# Patient Record
Sex: Male | Born: 1975
Health system: Southern US, Community
[De-identification: ages and names within clinical notes are randomized; demographics above are authoritative.]

## PROBLEM LIST (undated history)

## (undated) DIAGNOSIS — K7689 Other specified diseases of liver: Secondary | ICD-10-CM

## (undated) DIAGNOSIS — I10 Essential (primary) hypertension: Secondary | ICD-10-CM

## (undated) DIAGNOSIS — E785 Hyperlipidemia, unspecified: Secondary | ICD-10-CM

## (undated) HISTORY — DX: Other specified diseases of liver: K76.89

## (undated) HISTORY — DX: Essential (primary) hypertension: I10

## (undated) HISTORY — PX: VASECTOMY: SHX75

## (undated) HISTORY — PX: SPINE SURGERY: SHX786

## (undated) HISTORY — DX: Hyperlipidemia, unspecified: E78.5

---

## 2002-03-25 ENCOUNTER — Ambulatory Visit (HOSPITAL_COMMUNITY): Admission: RE | Admit: 2002-03-25 | Discharge: 2002-03-25 | Payer: Self-pay | Admitting: Neurosurgery

## 2002-03-25 ENCOUNTER — Encounter: Payer: Self-pay | Admitting: Neurosurgery

## 2002-09-15 ENCOUNTER — Encounter: Payer: Self-pay | Admitting: Neurosurgery

## 2002-09-15 ENCOUNTER — Ambulatory Visit (HOSPITAL_COMMUNITY): Admission: RE | Admit: 2002-09-15 | Discharge: 2002-09-15 | Payer: Self-pay | Admitting: Neurosurgery

## 2004-11-29 ENCOUNTER — Ambulatory Visit: Payer: Self-pay | Admitting: Family Medicine

## 2008-02-04 ENCOUNTER — Ambulatory Visit: Payer: Self-pay | Admitting: Internal Medicine

## 2009-11-10 ENCOUNTER — Ambulatory Visit: Payer: Self-pay | Admitting: Family

## 2011-05-30 ENCOUNTER — Other Ambulatory Visit: Payer: Self-pay | Admitting: Internal Medicine

## 2011-06-18 ENCOUNTER — Ambulatory Visit (INDEPENDENT_AMBULATORY_CARE_PROVIDER_SITE_OTHER): Payer: 59 | Admitting: Internal Medicine

## 2011-06-18 ENCOUNTER — Encounter: Payer: Self-pay | Admitting: Internal Medicine

## 2011-06-18 ENCOUNTER — Other Ambulatory Visit: Payer: Self-pay | Admitting: Internal Medicine

## 2011-06-18 ENCOUNTER — Ambulatory Visit (INDEPENDENT_AMBULATORY_CARE_PROVIDER_SITE_OTHER)
Admission: RE | Admit: 2011-06-18 | Discharge: 2011-06-18 | Disposition: A | Discharge status: Home / Self Care | Payer: 59 | Source: Ambulatory Visit | Attending: Internal Medicine | Admitting: Internal Medicine

## 2011-06-18 VITALS — BP 120/70 | HR 71 | Temp 98.1°F | Resp 16 | Ht 70.0 in | Wt 179.5 lb

## 2011-06-18 DIAGNOSIS — R109 Unspecified abdominal pain: Secondary | ICD-10-CM

## 2011-06-18 DIAGNOSIS — E1165 Type 2 diabetes mellitus with hyperglycemia: Secondary | ICD-10-CM | POA: Insufficient documentation

## 2011-06-18 DIAGNOSIS — E785 Hyperlipidemia, unspecified: Secondary | ICD-10-CM | POA: Insufficient documentation

## 2011-06-18 DIAGNOSIS — E1169 Type 2 diabetes mellitus with other specified complication: Secondary | ICD-10-CM | POA: Insufficient documentation

## 2011-06-18 DIAGNOSIS — IMO0002 Reserved for concepts with insufficient information to code with codable children: Secondary | ICD-10-CM | POA: Insufficient documentation

## 2011-06-18 DIAGNOSIS — E119 Type 2 diabetes mellitus without complications: Secondary | ICD-10-CM

## 2011-06-18 DIAGNOSIS — I10 Essential (primary) hypertension: Secondary | ICD-10-CM | POA: Insufficient documentation

## 2011-06-18 LAB — COMPREHENSIVE METABOLIC PANEL
ALT: 52 U/L (ref 0–53)
AST: 29 U/L (ref 0–37)
Albumin: 4.1 g/dL (ref 3.5–5.2)
Alkaline Phosphatase: 55 U/L (ref 39–117)
BUN: 15 mg/dL (ref 6–23)
CO2: 28 mEq/L (ref 19–32)
Calcium: 9.4 mg/dL (ref 8.4–10.5)
Chloride: 102 mEq/L (ref 96–112)
Creatinine, Ser: 0.7 mg/dL (ref 0.4–1.5)
GFR: 135.91 mL/min (ref >60.00)
Glucose, Bld: 162 mg/dL — ABNORMAL HIGH (ref 70–99)
Potassium: 4.3 mEq/L (ref 3.5–5.1)
Sodium: 139 mEq/L (ref 135–145)
Total Bilirubin: 0.6 mg/dL (ref 0.3–1.2)
Total Protein: 7.3 g/dL (ref 6.0–8.3)

## 2011-06-18 LAB — CBC WITH DIFFERENTIAL/PLATELET
Basophils Absolute: 0 10*3/uL (ref 0.0–0.1)
Basophils Relative: 0.7 % (ref 0.0–3.0)
Eosinophils Absolute: 0.3 10*3/uL (ref 0.0–0.7)
HCT: 42.1 % (ref 39.0–52.0)
Hemoglobin: 14.6 g/dL (ref 13.0–17.0)
Lymphocytes Relative: 24.6 % (ref 12.0–46.0)
Lymphs Abs: 1 10*3/uL (ref 0.7–4.0)
MCHC: 34.6 g/dL (ref 30.0–36.0)
MCV: 93.1 fl (ref 78.0–100.0)
Monocytes Absolute: 0.5 10*3/uL (ref 0.1–1.0)
Monocytes Relative: 11.2 % (ref 3.0–12.0)
Neutro Abs: 2.4 10*3/uL (ref 1.4–7.7)
Neutrophils Relative %: 57.1 % (ref 43.0–77.0)
Platelets: 194 10*3/uL (ref 150.0–400.0)
RBC: 4.52 Mil/uL (ref 4.22–5.81)
RDW: 12.4 % (ref 11.5–14.6)
WBC: 4.2 10*3/uL — ABNORMAL LOW (ref 4.5–10.5)

## 2011-06-18 NOTE — Progress Notes (Signed)
  Subjective:    Patient ID: Andrew Molina, male    DOB: [birth year], 35 y.o.   MRN: [id number]  HPI  35 yo white male with history of DM resents with a one week history of post prandial abdominal pain, severe at times, lasting several hours before resolving spontaneously.  The pain has been recurrent, waxing and waning for the last several days .  Sometimes lasting several hours sometimes lasting 8 to 12 hours,  Aggravated by eating.  He denies  nausea,  But had cold chills.[name]  His wife and daughter are both sick with flu like symptoms. Yesterday had a black stool but had taken Pepto bismol on Friday or Saturday. Today stool was brown but loose. He has a hsitroy of NSAID use, takes 2 Alleve daily up until last week but still taking one alleve daily,  No aspirin products.7  Past Medical History  Diagnosis Date  . Other chronic nonalcoholic liver disease   . Hypertension   . Diabetes mellitus 2005  . Hyperlipidemia   . Cataract    . Current Outpatient Prescriptions on File Prior to Visit  Medication Sig Dispense Refill  . lisinopril (PRINIVIL,ZESTRIL) 2.5 MG tablet TAKE ONE TABLET BY MOUTH EVERY DAY  90 tablet  3     Review of Systems     Objective:   Physical Exam  Abdominal: He exhibits no shifting dullness, no pulsatile liver, no fluid wave, no abdominal bruit, no ascites and no pulsatile midline mass. There is no hepatosplenomegaly. There is tenderness in the right lower quadrant and periumbilical area. There is no rebound, no CVA tenderness and negative Murphy's sign.            Assessment & Plan:  Abdominal pain:  Etiology unclear:  gastrtis vs pancreatitis, diverticulitis or appendicitis.  Empiric labs ordered,  Abdominal films to evla bowel gas patten  /Diabetes Mellitusl  He is overdue for his 3 month hgba1c., to be drawn today

## 2011-06-18 NOTE — Patient Instructions (Signed)
We will call you with the results of your x rays and labs.

## 2011-06-19 ENCOUNTER — Telehealth: Payer: Self-pay | Admitting: Internal Medicine

## 2011-06-19 LAB — LIPASE: Lipase: 52 U/L (ref 11.0–59.0)

## 2011-06-19 MED ORDER — OMEPRAZOLE 40 MG PO CPDR: 40.0000 mg | DELAYED_RELEASE_CAPSULE | Freq: Every day | ORAL | Status: DC

## 2011-06-19 NOTE — Progress Notes (Signed)
Addended by: Duncan Dull on: 06/19/2011 05:34 PM   Modules accepted: Orders

## 2011-06-19 NOTE — Telephone Encounter (Signed)
[phone] PATIENT CALL TO GET LAB AND XRAY RESULTS HE HAD DONE YESTERDAY

## 2011-06-19 NOTE — Telephone Encounter (Signed)
Patient notified of x-ray results.

## 2011-06-21 ENCOUNTER — Telehealth: Payer: Self-pay | Admitting: Internal Medicine

## 2011-06-21 NOTE — Telephone Encounter (Signed)
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

## 2011-09-20 ENCOUNTER — Ambulatory Visit (INDEPENDENT_AMBULATORY_CARE_PROVIDER_SITE_OTHER): Payer: 59 | Admitting: Internal Medicine

## 2011-09-20 ENCOUNTER — Encounter: Payer: Self-pay | Admitting: Internal Medicine

## 2011-09-20 ENCOUNTER — Other Ambulatory Visit: Payer: Self-pay | Admitting: *Deleted

## 2011-09-20 VITALS — BP 118/72 | HR 92 | Temp 98.5°F | Wt 181.0 lb

## 2011-09-20 DIAGNOSIS — E118 Type 2 diabetes mellitus with unspecified complications: Secondary | ICD-10-CM

## 2011-09-20 DIAGNOSIS — K760 Fatty (change of) liver, not elsewhere classified: Secondary | ICD-10-CM

## 2011-09-20 DIAGNOSIS — IMO0001 Reserved for inherently not codable concepts without codable children: Secondary | ICD-10-CM

## 2011-09-20 DIAGNOSIS — E1165 Type 2 diabetes mellitus with hyperglycemia: Secondary | ICD-10-CM

## 2011-09-20 DIAGNOSIS — K7689 Other specified diseases of liver: Secondary | ICD-10-CM

## 2011-09-20 DIAGNOSIS — E785 Hyperlipidemia, unspecified: Secondary | ICD-10-CM

## 2011-09-20 DIAGNOSIS — I10 Essential (primary) hypertension: Secondary | ICD-10-CM

## 2011-09-20 DIAGNOSIS — IMO0002 Reserved for concepts with insufficient information to code with codable children: Secondary | ICD-10-CM

## 2011-09-20 LAB — COMPREHENSIVE METABOLIC PANEL
Alkaline Phosphatase: 48 U/L (ref 39–117)
CO2: 25 mEq/L (ref 19–32)
Creatinine, Ser: 0.9 mg/dL (ref 0.4–1.5)
GFR: 96.58 mL/min (ref >60.00)
Glucose, Bld: 234 mg/dL — ABNORMAL HIGH (ref 70–99)
Total Bilirubin: 0.3 mg/dL (ref 0.3–1.2)

## 2011-09-20 MED ORDER — SITAGLIPTIN PHOSPHATE 100 MG PO TABS: 100.0000 mg | ORAL_TABLET | Freq: Every day | ORAL | Status: DC

## 2011-09-20 MED ORDER — GLUCOSE BLOOD VI STRP: ORAL_STRIP | Status: DC

## 2011-09-20 NOTE — Patient Instructions (Signed)
We are adding januvia 100 mg one  tablet dail  to metformin and glipizide.    Check your 2 hr post prandial lunch blood sugar once in a while

## 2011-09-20 NOTE — Progress Notes (Signed)
Subjective:    Patient ID: Andrew Molina, male    DOB: 1976/04/23, 36 y.o.   MRN: [id number]  HPI  36 yr old white male with DM Type 2, on oral medications, hyperlipidemia, presents for 3 month follow up.  Not checking sugars regularly because of cost of test strips.  Has not been exercising regularly or limiting his starches because he eats fast food at lunch daily and several times weekly for dinner.  Tolerating glipizide and metformin with no episodes of hypoglycemia.  I suggested adding insulin if his repeat A1c remains elevated,  Last one was 7.9 in November.  He recalls insulin "not working" for him several years ago with Dr. Elease Hashimoto.    Past Medical History  Diagnosis Date  . Other chronic nonalcoholic liver disease   . Hypertension   . Diabetes mellitus 2005  . Hyperlipidemia   . Cataract    Current Outpatient Prescriptions on File Prior to Visit  Medication Sig Dispense Refill  . glipiZIDE (GLUCOTROL) 10 MG tablet Take 10 mg by mouth 2 (two) times daily before a meal.        . lisinopril (PRINIVIL,ZESTRIL) 2.5 MG tablet TAKE ONE TABLET BY MOUTH EVERY DAY  90 tablet  3  . metFORMIN (GLUCOPHAGE) 1000 MG tablet Take 1,000 mg by mouth 2 (two) times daily with a meal.        . omeprazole (PRILOSEC) 40 MG capsule Take 1 capsule (40 mg total) by mouth daily.  30 capsule  2  . pravastatin (PRAVACHOL) 20 MG tablet Take 20 mg by mouth daily.          Review of Systems  Constitutional: Negative for fever, chills, diaphoresis, activity change, appetite change, fatigue and unexpected weight change.  HENT: Negative for hearing loss, ear pain, nosebleeds, congestion, sore throat, facial swelling, rhinorrhea, sneezing, drooling, mouth sores, trouble swallowing, neck pain, neck stiffness, dental problem, voice change, postnasal drip, sinus pressure, tinnitus and ear discharge.   Eyes: Negative for photophobia, pain, discharge, redness, itching and visual disturbance.  Respiratory: Negative for apnea,  cough, choking, chest tightness, shortness of breath, wheezing and stridor.   Cardiovascular: Negative for chest pain, palpitations and leg swelling.  Gastrointestinal: Negative for nausea, vomiting, abdominal pain, diarrhea, constipation, blood in stool, abdominal distention, anal bleeding and rectal pain.  Genitourinary: Negative for dysuria, urgency, frequency, hematuria, flank pain, decreased urine volume, scrotal swelling, difficulty urinating and testicular pain.  Musculoskeletal: Negative for myalgias, back pain, joint swelling, arthralgias and gait problem.  Skin: Negative for color change, rash and wound.  Neurological: Negative for dizziness, tremors, seizures, syncope, speech difficulty, weakness, light-headedness, numbness and headaches.  Psychiatric/Behavioral: Negative for suicidal ideas, hallucinations, behavioral problems, confusion, sleep disturbance, dysphoric mood, decreased concentration and agitation. The patient is not nervous/anxious.        Objective:   Physical Exam  Constitutional: He is oriented to person, place, and time.  HENT:  Head: Normocephalic and atraumatic.  Mouth/Throat: Oropharynx is clear and moist.  Eyes: Conjunctivae and EOM are normal.  Neck: Normal range of motion. Neck supple. No JVD present. No thyromegaly present.  Cardiovascular: Normal rate, regular rhythm and normal heart sounds.   Pulmonary/Chest: Effort normal and breath sounds normal. He has no wheezes. He has no rales.  Abdominal: Soft. Bowel sounds are normal. He exhibits no mass. There is no tenderness. There is no rebound.  Musculoskeletal: Normal range of motion. He exhibits no edema.  Neurological: He is alert and oriented to person, place, and  time.  Skin: Skin is warm and dry.  Psychiatric: He has a normal mood and affect.       Assessment & Plan:   Diabetes mellitus type 2, uncontrolled A1c has risen to 8.3 from 7.9 at last check. He is not following a low glycemic index  diet as much as he could and is eating fast food daily for lunch and 2-3 times per week on at dinner.  He is reached maximal benefit of glipizide and metformin and will need to start a third agent. He is reluctant to start insulin but will do so necessary we will try Januvia first at 100 mg daily. Samples for 2 months were given to patient today. If this medication is affordable on his current drug plan we will continue this as adjunct therapy to glipizide and metformin. If not we will start using NPH bedtime insulin portable fasting glucoses of 80-120. Foot exam today was normal. He was reminded to continue annual eye exams with his ophthalmologist. He is on an ACE inhibitor and a statin for goal LDL of 70.  Hyperlipidemia Managed with pravastatin without side effects. He was not fasting today so a fasting panel was not drawn. He has mild ALT elevation today compared to last time. We will follow this again with a repeat hepatic panel in 3 months.  Hypertension Controlled on current regimen. No changes today.  NAFLD (nonalcoholic fatty liver disease) By prior evaluation by GI. No history of liver biopsy. He has mild enzyme elevation today likely due to uncontrolled diabetes and hyperlipidemia. Will follow. Low glycemic index diet again recommended.    Updated Medication List Outpatient Encounter Prescriptions as of 09/20/2011  Medication Sig Dispense Refill  . DISCONTD: glucose blood test strip 1 each by Other route as needed. Use as instructed      . glipiZIDE (GLUCOTROL) 10 MG tablet Take 10 mg by mouth 2 (two) times daily before a meal.        . lisinopril (PRINIVIL,ZESTRIL) 2.5 MG tablet TAKE ONE TABLET BY MOUTH EVERY DAY  90 tablet  3  . metFORMIN (GLUCOPHAGE) 1000 MG tablet Take 1,000 mg by mouth 2 (two) times daily with a meal.        . omeprazole (PRILOSEC) 40 MG capsule Take 1 capsule (40 mg total) by mouth daily.  30 capsule  2  . pravastatin (PRAVACHOL) 20 MG tablet Take 20 mg by mouth  daily.        . sitaGLIPtin (JANUVIA) 100 MG tablet Take 1 tablet (100 mg total) by mouth daily.  30 tablet  1

## 2011-09-22 ENCOUNTER — Encounter: Payer: Self-pay | Admitting: Internal Medicine

## 2011-09-22 DIAGNOSIS — K760 Fatty (change of) liver, not elsewhere classified: Secondary | ICD-10-CM | POA: Insufficient documentation

## 2011-09-22 NOTE — Assessment & Plan Note (Addendum)
A1c has risen to 8.3 from 7.9 at last check. He is not following a low glycemic index diet as much as he could and is eating fast food daily for lunch and 2-3 times per week on at dinner.  He is reached maximal benefit of glipizide and metformin and will need to start a third agent. He is reluctant to start insulin but will do so necessary we will try Januvia first at 100 mg daily. Samples for 2 months were given to patient today. If this medication is affordable on his current drug plan we will continue this as adjunct therapy to glipizide and metformin. If not we will start using NPH bedtime insulin portable fasting glucoses of 80-120. Foot exam today was normal. He was reminded to continue annual eye exams with his ophthalmologist. He is on an ACE inhibitor and a statin for goal LDL of 70.

## 2011-09-22 NOTE — Assessment & Plan Note (Signed)
By prior evaluation by GI. No history of liver biopsy. He has mild enzyme elevation today likely due to uncontrolled diabetes and hyperlipidemia. Will follow. Low glycemic index diet again recommended.

## 2011-09-22 NOTE — Assessment & Plan Note (Signed)
Managed with pravastatin without side effects. He was not fasting today so a fasting panel was not drawn. He has mild ALT elevation today compared to last time. We will follow this again with a repeat hepatic panel in 3 months.

## 2011-09-22 NOTE — Assessment & Plan Note (Signed)
Controlled on current regimen. No changes today 

## 2011-11-11 ENCOUNTER — Encounter: Payer: Self-pay | Admitting: Internal Medicine

## 2011-11-19 ENCOUNTER — Telehealth: Payer: Self-pay | Admitting: Internal Medicine

## 2011-11-19 MED ORDER — PRAVASTATIN SODIUM 20 MG PO TABS: 20.0000 mg | ORAL_TABLET | Freq: Every day | ORAL | Status: DC

## 2011-11-19 NOTE — Telephone Encounter (Signed)
[phone] Pt wife came in he needs refill on pravastatin sodium tablets usp 20 mg [facility] [address]

## 2011-11-19 NOTE — Telephone Encounter (Signed)
Rx called in, patient notified.

## 2011-11-21 ENCOUNTER — Telehealth: Payer: Self-pay | Admitting: Internal Medicine

## 2011-11-21 NOTE — Telephone Encounter (Signed)
Blood sugars are improving with addition of januvia. Ask him to check a few more evening sugars.

## 2011-11-21 NOTE — Telephone Encounter (Signed)
Patient notified

## 2011-11-27 ENCOUNTER — Telehealth: Payer: Self-pay | Admitting: Internal Medicine

## 2011-11-27 DIAGNOSIS — E1165 Type 2 diabetes mellitus with hyperglycemia: Secondary | ICD-10-CM

## 2011-11-27 DIAGNOSIS — IMO0002 Reserved for concepts with insufficient information to code with codable children: Secondary | ICD-10-CM

## 2011-11-27 MED ORDER — SITAGLIPTIN PHOSPHATE 100 MG PO TABS: 100.0000 mg | ORAL_TABLET | Freq: Every day | ORAL | Status: DC

## 2011-11-27 NOTE — Telephone Encounter (Signed)
Spoke with patient, we do not have samples at this time. New rx sent to pharmacy.

## 2011-11-27 NOTE — Telephone Encounter (Signed)
[phone] Pt needs to get samples or rx  For  jueniva Please advise pt [facility] You can leave message on the above phone number

## 2011-11-28 ENCOUNTER — Ambulatory Visit: Payer: 59 | Admitting: Internal Medicine

## 2011-11-28 ENCOUNTER — Other Ambulatory Visit: Payer: Self-pay | Admitting: Internal Medicine

## 2011-12-05 ENCOUNTER — Ambulatory Visit: Payer: 59 | Admitting: Internal Medicine

## 2011-12-11 ENCOUNTER — Telehealth: Payer: Self-pay | Admitting: Internal Medicine

## 2011-12-11 DIAGNOSIS — E118 Type 2 diabetes mellitus with unspecified complications: Secondary | ICD-10-CM

## 2011-12-11 DIAGNOSIS — IMO0002 Reserved for concepts with insufficient information to code with codable children: Secondary | ICD-10-CM

## 2011-12-11 MED ORDER — SITAGLIPTIN PHOSPHATE 100 MG PO TABS: 100.0000 mg | ORAL_TABLET | Freq: Every day | ORAL | Status: DC

## 2011-12-11 NOTE — Telephone Encounter (Signed)
[phone] Pt called checking on his rx for juniva  [facility] [address] Pt was told this was called in he went to pick this up and it is not there Please advise pt

## 2011-12-11 NOTE — Telephone Encounter (Signed)
Rx called in, patient notified.

## 2011-12-17 ENCOUNTER — Encounter: Payer: Self-pay | Admitting: Internal Medicine

## 2011-12-17 ENCOUNTER — Ambulatory Visit (INDEPENDENT_AMBULATORY_CARE_PROVIDER_SITE_OTHER): Payer: 59 | Admitting: Internal Medicine

## 2011-12-17 DIAGNOSIS — E1165 Type 2 diabetes mellitus with hyperglycemia: Secondary | ICD-10-CM

## 2011-12-17 DIAGNOSIS — I10 Essential (primary) hypertension: Secondary | ICD-10-CM

## 2011-12-17 DIAGNOSIS — E785 Hyperlipidemia, unspecified: Secondary | ICD-10-CM

## 2011-12-17 DIAGNOSIS — IMO0001 Reserved for inherently not codable concepts without codable children: Secondary | ICD-10-CM

## 2011-12-17 DIAGNOSIS — IMO0002 Reserved for concepts with insufficient information to code with codable children: Secondary | ICD-10-CM

## 2011-12-17 DIAGNOSIS — E119 Type 2 diabetes mellitus without complications: Secondary | ICD-10-CM

## 2011-12-17 LAB — HEMOGLOBIN A1C: Mean Plasma Glucose: 128 mg/dL — ABNORMAL HIGH (ref <117)

## 2011-12-17 MED ORDER — SAXAGLIPTIN HCL 5 MG PO TABS: 5.0000 mg | ORAL_TABLET | Freq: Every day | ORAL | Status: DC

## 2011-12-17 NOTE — Progress Notes (Signed)
Patient ID: Andrew Molina, male   DOB: Aug 26, 1975, 36 y.o.   MRN: [id number]  Patient Active Problem List  Diagnoses  . Hypertension  . Diabetes mellitus type 2, uncontrolled  . Hyperlipidemia  . NAFLD (nonalcoholic fatty liver disease)    Subjective:  CC:   Chief Complaint  Patient presents with  . Follow-up    HPI:   Andrew Molina a 36 y.o. male who presents Follow up on diabetes.  At last visit he was given rx and samples of Januvia to add to metformin/glipizide for hgba1c of 8.3 on maximal doses of glipizide and metformin.  Since last visit he has been adhering to a low glyceminc index diet more frequently and takign the [place] up until about 10 days ago when he ran out of samples.  The copay is $60/month, which is not affordable.  Has not had any lows except when he skips a meal. Denies nausea, vomiting and diarrhea.    Past Medical History  Diagnosis Date  . Other chronic nonalcoholic liver disease   . Hypertension   . Diabetes mellitus 2005  . Hyperlipidemia   . Cataract     Past Surgical History  Procedure Date  . Spine surgery     microdiskectomy         The following portions of the patient's history were reviewed and updated as appropriate: Allergies, current medications, and problem list.    Review of Systems:   12 Pt  review of systems was negative except those addressed in the HPI,     History   Social History  . Marital Status: Married    Spouse Name: N/A    Number of Children: N/A  . Years of Education: N/A   Occupational History  . Not on file.   Social History Main Topics  . Smoking status: Former Smoker    Quit date: 06/17/2000  . Smokeless tobacco: Current User    Types: Snuff  . Alcohol Use: 3.0 oz/week    5 Cans of beer per week  . Drug Use: No  . Sexually Active: Yes   Other Topics Concern  . Not on file   Social History Narrative  . No narrative on file    Objective:  BP 114/62  Pulse 80  Temp(Src) 98.4 F (36.9  C) (Oral)  Resp 16  Ht 5\' 10"  (1.778 m)  Wt 175 lb 8 oz (79.606 kg)  BMI 25.18 kg/m2  SpO2 97%  General appearance: alert, cooperative and appears stated age Ears: normal TM's and external ear canals both ears Throat: lips, mucosa, and tongue normal; teeth and gums normal Neck: no adenopathy, no carotid bruit, supple, symmetrical, trachea midline and thyroid not enlarged, symmetric, no tenderness/mass/nodules Back: symmetric, no curvature. ROM normal. No CVA tenderness. Lungs: clear to auscultation bilaterally Heart: regular rate and rhythm, S1, S2 normal, no murmur, click, rub or gallop Abdomen: soft, non-tender; bowel sounds normal; no masses,  no organomegaly Pulses: 2+ and symmetric Skin: Skin color, texture, turgor normal. No rashes or lesions Lymph nodes: Cervical, supraclavicular, and axillary nodes normal.  Assessment and Plan:  Diabetes mellitus type 2, uncontrolled Improved with addition of Januvia and better adherence to low glycemic index diet.  Since he is reliant on samples and we are out of [place],  I have given him 3 months on onglyza  (saxagliptan) to use instead, in single form and in combination with metformin.   Hypertension Well controlled on current medications.  No changes today.  Hyperlipidemia  Managed with pravastatin without side effects. He was not fasting today so a fasting panel was not drawn. He has mild ALT elevation today compared to last time. We will follow this again with a repeat hepatic panel in 3 months.       Updated Medication List Outpatient Encounter Prescriptions as of 12/17/2011  Medication Sig Dispense Refill  . glipiZIDE (GLUCOTROL) 10 MG tablet TAKE ONE TABLET BY MOUTH TWICE DAILY  180 tablet  3  . glucose blood test strip Test blood sugar twice a day  100 each  6  . lisinopril (PRINIVIL,ZESTRIL) 2.5 MG tablet TAKE ONE TABLET BY MOUTH EVERY DAY  90 tablet  3  . metFORMIN (GLUCOPHAGE) 1000 MG tablet Take 1,000 mg by mouth 2 (two)  times daily with a meal.        . pravastatin (PRAVACHOL) 20 MG tablet Take 1 tablet (20 mg total) by mouth daily.  30 tablet  3  . DISCONTD: sitaGLIPtin (JANUVIA) 100 MG tablet Take 1 tablet (100 mg total) by mouth daily.  30 tablet  2  . saxagliptin HCl (ONGLYZA) 5 MG TABS tablet Take 1 tablet (5 mg total) by mouth daily.  30 tablet  6  . DISCONTD: omeprazole (PRILOSEC) 40 MG capsule Take 1 capsule (40 mg total) by mouth daily.  30 capsule  2     Orders Placed This Encounter  Procedures  . Hemoglobin A1c  . COMPLETE METABOLIC PANEL WITH GFR  . Microalbumin / creatinine urine ratio  . Hemoglobin A1c  . Comp Met (CMET)  . Microalbumin / creatinine urine ratio    Return in about 3 months (around 03/18/2012).

## 2011-12-17 NOTE — Patient Instructions (Signed)
We are changing from Januvia to onglyza (similar to Januvia, once daily)  either alone or in the combination with controlled release metformin  if you take komblgyze XRE,  Do no take additional metformin with it  If you are taking the onglyza,  Continue your metformin twice daily.

## 2011-12-18 ENCOUNTER — Encounter: Payer: Self-pay | Admitting: *Deleted

## 2011-12-18 ENCOUNTER — Encounter: Payer: Self-pay | Admitting: Internal Medicine

## 2011-12-18 LAB — COMPREHENSIVE METABOLIC PANEL
Albumin: 4.8 g/dL (ref 3.5–5.2)
BUN: 22 mg/dL (ref 6–23)
Calcium: 9.9 mg/dL (ref 8.4–10.5)
Chloride: 103 mEq/L (ref 96–112)
Glucose, Bld: 67 mg/dL — ABNORMAL LOW (ref 70–99)
Potassium: 5 mEq/L (ref 3.5–5.3)

## 2011-12-18 LAB — MICROALBUMIN / CREATININE URINE RATIO
Creatinine, Urine: 111.8 mg/dL
Microalb Creat Ratio: 7.5 mg/g (ref 0.0–30.0)
Microalb, Ur: 0.84 mg/dL (ref 0.00–1.89)

## 2011-12-18 NOTE — Assessment & Plan Note (Signed)
Improved with addition of Januvia and better adherence to low glycemic index diet.  Since he is reliant on samples and we are out of [place],  I have given him 3 months on onglyza  (saxagliptan) to use instead, in single form and in combination with metformin.

## 2011-12-18 NOTE — Assessment & Plan Note (Signed)
Managed with pravastatin without side effects. He was not fasting today so a fasting panel was not drawn. He has mild ALT elevation today compared to last time. We will follow this again with a repeat hepatic panel in 3 months.

## 2011-12-18 NOTE — Assessment & Plan Note (Signed)
Well controlled on current medications.  No changes today. 

## 2012-01-16 ENCOUNTER — Other Ambulatory Visit: Payer: Self-pay | Admitting: Internal Medicine

## 2012-03-18 ENCOUNTER — Ambulatory Visit: Payer: 59 | Admitting: Internal Medicine

## 2012-04-02 ENCOUNTER — Other Ambulatory Visit: Payer: Self-pay | Admitting: Internal Medicine

## 2012-04-24 ENCOUNTER — Other Ambulatory Visit: Payer: 59

## 2012-04-27 ENCOUNTER — Ambulatory Visit: Payer: 59 | Admitting: Internal Medicine

## 2012-04-28 ENCOUNTER — Other Ambulatory Visit (INDEPENDENT_AMBULATORY_CARE_PROVIDER_SITE_OTHER): Payer: 59

## 2012-04-28 DIAGNOSIS — IMO0001 Reserved for inherently not codable concepts without codable children: Secondary | ICD-10-CM

## 2012-04-28 DIAGNOSIS — E785 Hyperlipidemia, unspecified: Secondary | ICD-10-CM

## 2012-04-28 DIAGNOSIS — E119 Type 2 diabetes mellitus without complications: Secondary | ICD-10-CM

## 2012-04-28 DIAGNOSIS — IMO0002 Reserved for concepts with insufficient information to code with codable children: Secondary | ICD-10-CM

## 2012-04-28 DIAGNOSIS — I10 Essential (primary) hypertension: Secondary | ICD-10-CM

## 2012-04-28 LAB — LIPID PANEL
HDL: 37.4 mg/dL — ABNORMAL LOW (ref >39.00)
Total CHOL/HDL Ratio: 5
VLDL: 30.2 mg/dL (ref 0.0–40.0)

## 2012-04-28 LAB — COMPREHENSIVE METABOLIC PANEL
ALT: 57 U/L — ABNORMAL HIGH (ref 0–53)
AST: 29 U/L (ref 0–37)
Creatinine, Ser: 0.7 mg/dL (ref 0.4–1.5)
GFR: 147.33 mL/min (ref >60.00)
Total Bilirubin: 0.5 mg/dL (ref 0.3–1.2)

## 2012-04-28 LAB — HEMOGLOBIN A1C: Hgb A1c MFr Bld: 6.2 % (ref 4.6–6.5)

## 2012-04-28 MED ORDER — PRAVASTATIN SODIUM 20 MG PO TABS: 40.0000 mg | ORAL_TABLET | Freq: Every day | ORAL | Status: DC

## 2012-04-28 NOTE — Addendum Note (Signed)
Addended by: Duncan Dull on: 04/28/2012 05:42 PM   Modules accepted: Orders

## 2012-04-29 ENCOUNTER — Other Ambulatory Visit: Payer: Self-pay | Admitting: Internal Medicine

## 2012-04-29 MED ORDER — PRAVASTATIN SODIUM 40 MG PO TABS: 40.0000 mg | ORAL_TABLET | Freq: Every day | ORAL | Status: DC

## 2012-05-05 ENCOUNTER — Encounter: Payer: Self-pay | Admitting: Internal Medicine

## 2012-05-05 ENCOUNTER — Ambulatory Visit (INDEPENDENT_AMBULATORY_CARE_PROVIDER_SITE_OTHER): Payer: 59 | Admitting: Internal Medicine

## 2012-05-05 VITALS — BP 116/80 | HR 88 | Temp 97.9°F | Resp 16 | Ht 70.0 in | Wt 177.0 lb

## 2012-05-05 DIAGNOSIS — E119 Type 2 diabetes mellitus without complications: Secondary | ICD-10-CM

## 2012-05-05 DIAGNOSIS — K7689 Other specified diseases of liver: Secondary | ICD-10-CM

## 2012-05-05 DIAGNOSIS — K2960 Other gastritis without bleeding: Secondary | ICD-10-CM

## 2012-05-05 DIAGNOSIS — K760 Fatty (change of) liver, not elsewhere classified: Secondary | ICD-10-CM

## 2012-05-05 DIAGNOSIS — E785 Hyperlipidemia, unspecified: Secondary | ICD-10-CM

## 2012-05-05 NOTE — Patient Instructions (Addendum)
decrease your glipizide to 5 mg twice daily to see if the hypoglycemic events stop happening .

## 2012-05-05 NOTE — Progress Notes (Signed)
Patient ID: Andrew Molina, male   DOB: 1976-04-23, 36 y.o.   MRN: [id number]  Patient Active Problem List  Diagnosis  . Hypertension  . Hyperlipidemia LDL goal < 70  . NAFLD (nonalcoholic fatty liver disease)  . Diabetes mellitus type 2, uncomplicated  . Gastritis due to nonsteroidal anti-inflammatory drug    Subjective:  CC:   Chief Complaint  Patient presents with  . Follow-up    DM, ran out of Onglyza samples    HPI:   Andrew Molina a 36 y.o. male who presents 3 month followup on diabetes and hyperlipidemia. At last visit a third agent was added for control of diabetes. However he has been out of onglyza for  the past 3 weeks,  and has beenTaking just glipizde and metformin.  He reports symptoms of hypoglecemia occurring about  3 hours post breakfast  that are occurring in twice weekly. He has modified his diet somewhat and is restricting his carbohydrates more than in the past. He does not check his sugars on a regular basis.  2) he has been having mid to low thoracic back pain since Saturday  when he went bow hunting.  He has been Using alleve once daily (2 tablets) .   The pain does not radiate into either buttock or leg. It is improving. He has had no stomach upset with use of Alleve. Although in the past he has had gastritis from use of NSAIDs   Past Medical History  Diagnosis Date  . Other chronic nonalcoholic liver disease   . Hypertension   . Diabetes mellitus 2005  . Hyperlipidemia   . Cataract     Past Surgical History  Procedure Date  . Spine surgery     microdiskectomy         The following portions of the patient's history were reviewed and updated as appropriate: Allergies, current medications, and problem list.    Review of Systems:   12 Pt  review of systems was negative except those addressed in the HPI,     History   Social History  . Marital Status: Married    Spouse Name: N/A    Number of Children: N/A  . Years of Education: N/A    Occupational History  . Not on file.   Social History Main Topics  . Smoking status: Former Smoker    Quit date: 06/17/2000  . Smokeless tobacco: Current User    Types: Snuff  . Alcohol Use: 3.0 oz/week    5 Cans of beer per week  . Drug Use: No  . Sexually Active: Yes   Other Topics Concern  . Not on file   Social History Narrative  . No narrative on file    Objective:  BP 116/80  Pulse 88  Temp 97.9 F (36.6 C) (Oral)  Resp 16  Ht 5\' 10"  (1.778 m)  Wt 177 lb (80.287 kg)  BMI 25.40 kg/m2  SpO2 98%  General appearance: alert, cooperative and appears stated age Ears: normal TM's and external ear canals both ears Throat: lips, mucosa, and tongue normal; teeth and gums normal Neck: no adenopathy, no carotid bruit, supple, symmetrical, trachea midline and thyroid not enlarged, symmetric, no tenderness/mass/nodules Back: symmetric, no curvature. ROM normal. No CVA tenderness. Lungs: clear to auscultation bilaterally Heart: regular rate and rhythm, S1, S2 normal, no murmur, click, rub or gallop Abdomen: soft, non-tender; bowel sounds normal; no masses,  no organomegaly Pulses: 2+ and symmetric Skin: Skin color, texture, turgor normal. No  rashes or lesions Lymph nodes: Cervical, supraclavicular, and axillary nodes normal.  Assessment and Plan:  Diabetes mellitus type 2, uncomplicated His diabetes is now well controlled since adding a third agent and modifying his diet. He will A1c is now 6.1. Since he has been Ativan positive for 3 weeks we have opted to continue suspension of the third agent. Since he is continuing to hypoglycemic events without it I have changed his glipizide dose to 5 mg twice daily. He can increase this if he notices his blood sugars start to rise. We will repeat his hemoglobin A1c in 3 months. Reminder for annual eye exam given. Flu exam offered but also rejected by the patient.  Hyperlipidemia LDL goal < 70 I have increased his pravastatin dose to  40 mg daily for goal LDL less than 70, currently 124. Repeat in 3 months.  NAFLD (nonalcoholic fatty liver disease) Liver enzymes have been slightly elevated but stable. No changes to regimen today. Low glycemic index diet and daily exercise again reinforced.  Gastritis due to nonsteroidal anti-inflammatory drug He has a history of mild gastritis without ulcer during prior use of NSAIDs. He is currently using Aleve for management of back strain. Recommended use of a daily PPI during use of NSAIDs.   Updated Medication List Outpatient Encounter Prescriptions as of 05/05/2012  Medication Sig Dispense Refill  . glipiZIDE (GLUCOTROL) 10 MG tablet TAKE ONE TABLET BY MOUTH TWICE DAILY  180 tablet  3  . glucose blood test strip Test blood sugar twice a day  100 each  6  . lisinopril (PRINIVIL,ZESTRIL) 2.5 MG tablet TAKE ONE TABLET BY MOUTH EVERY DAY  90 tablet  3  . metFORMIN (GLUCOPHAGE) 1000 MG tablet TAKE ONE TABLET BY MOUTH TWICE DAILY  180 tablet  3  . pravastatin (PRAVACHOL) 40 MG tablet Take 1 tablet (40 mg total) by mouth daily.  90 tablet  3  . saxagliptin HCl (ONGLYZA) 5 MG TABS tablet Take 1 tablet (5 mg total) by mouth daily.  30 tablet  6     No orders of the defined types were placed in this encounter.    No Follow-up on file.

## 2012-05-06 DIAGNOSIS — E119 Type 2 diabetes mellitus without complications: Secondary | ICD-10-CM | POA: Insufficient documentation

## 2012-05-06 DIAGNOSIS — K2960 Other gastritis without bleeding: Secondary | ICD-10-CM | POA: Insufficient documentation

## 2012-05-06 NOTE — Assessment & Plan Note (Signed)
I have increased his pravastatin dose to 40 mg daily for goal LDL less than 70, currently 124. Repeat in 3 months.

## 2012-05-06 NOTE — Assessment & Plan Note (Signed)
His diabetes is now well controlled since adding a third agent and modifying his diet. He will A1c is now 6.1. Since he has been Ativan positive for 3 weeks we have opted to continue suspension of the third agent. Since he is continuing to hypoglycemic events without it I have changed his glipizide dose to 5 mg twice daily. He can increase this if he notices his blood sugars start to rise. We will repeat his hemoglobin A1c in 3 months.

## 2012-05-06 NOTE — Assessment & Plan Note (Signed)
He has a history of mild gastritis without ulcer during prior use of NSAIDs. He is currently using Aleve for management of back strain. Recommended use of a daily PPI during use of NSAIDs.

## 2012-05-06 NOTE — Assessment & Plan Note (Signed)
Liver enzymes have been slightly elevated but stable. No changes to regimen today. Low glycemic index diet and daily exercise again reinforced.

## 2012-06-25 ENCOUNTER — Other Ambulatory Visit: Payer: Self-pay | Admitting: Internal Medicine

## 2012-08-06 ENCOUNTER — Telehealth: Payer: Self-pay | Admitting: *Deleted

## 2012-08-06 NOTE — Telephone Encounter (Signed)
A1c, cmet,  And fasting lipids.  Diabetes mellitus, hyperlipidemia thanks

## 2012-08-06 NOTE — Telephone Encounter (Signed)
Pt is coming in for labs tomorrow 08-06-2012, what labs and dx would you like for this pt? Thank you

## 2012-08-07 ENCOUNTER — Other Ambulatory Visit (INDEPENDENT_AMBULATORY_CARE_PROVIDER_SITE_OTHER): Payer: 59

## 2012-08-07 ENCOUNTER — Other Ambulatory Visit: Payer: Self-pay | Admitting: *Deleted

## 2012-08-07 DIAGNOSIS — E119 Type 2 diabetes mellitus without complications: Secondary | ICD-10-CM

## 2012-08-07 DIAGNOSIS — E785 Hyperlipidemia, unspecified: Secondary | ICD-10-CM

## 2012-08-07 LAB — COMPREHENSIVE METABOLIC PANEL
Alkaline Phosphatase: 52 U/L (ref 39–117)
BUN: 15 mg/dL (ref 6–23)
CO2: 26 mEq/L (ref 19–32)
Creatinine, Ser: 0.6 mg/dL (ref 0.4–1.5)
GFR: 149.76 mL/min (ref >60.00)
Glucose, Bld: 315 mg/dL — ABNORMAL HIGH (ref 70–99)
Total Bilirubin: 0.8 mg/dL (ref 0.3–1.2)
Total Protein: 6.9 g/dL (ref 6.0–8.3)

## 2012-08-07 LAB — LIPID PANEL
Cholesterol: 171 mg/dL (ref 0–200)
HDL: 37.6 mg/dL — ABNORMAL LOW (ref >39.00)
Total CHOL/HDL Ratio: 5
Triglycerides: 227 mg/dL — ABNORMAL HIGH (ref 0.0–149.0)
VLDL: 45.4 mg/dL — ABNORMAL HIGH (ref 0.0–40.0)

## 2012-08-12 ENCOUNTER — Ambulatory Visit (INDEPENDENT_AMBULATORY_CARE_PROVIDER_SITE_OTHER): Payer: 59 | Admitting: Internal Medicine

## 2012-08-12 ENCOUNTER — Encounter: Payer: Self-pay | Admitting: Internal Medicine

## 2012-08-12 VITALS — BP 116/78 | HR 77 | Temp 97.9°F | Resp 16 | Wt 180.5 lb

## 2012-08-12 DIAGNOSIS — IMO0001 Reserved for inherently not codable concepts without codable children: Secondary | ICD-10-CM

## 2012-08-12 DIAGNOSIS — I10 Essential (primary) hypertension: Secondary | ICD-10-CM

## 2012-08-12 DIAGNOSIS — E119 Type 2 diabetes mellitus without complications: Secondary | ICD-10-CM

## 2012-08-12 DIAGNOSIS — E785 Hyperlipidemia, unspecified: Secondary | ICD-10-CM

## 2012-08-12 NOTE — Patient Instructions (Addendum)
  We need to start either onglyza, tradjenta or januvia to get your diabetes under control.  I have given you samples of each,  Please check the [employer] formularl to see which one they cover the best  And let me know.  [employer] makes a low carb pasta 5 /serving available at all groceries .

## 2012-08-13 ENCOUNTER — Encounter: Payer: Self-pay | Admitting: Internal Medicine

## 2012-08-13 ENCOUNTER — Telehealth: Payer: Self-pay | Admitting: Internal Medicine

## 2012-08-13 MED ORDER — ATORVASTATIN CALCIUM 20 MG PO TABS: 20.0000 mg | ORAL_TABLET | Freq: Every day | ORAL | Status: DC

## 2012-08-13 NOTE — Assessment & Plan Note (Addendum)
Without the third agent his hemoglobin achas  risen to above 8. We will resume onglyza.  He is also been given samples of tradjenta N. Januvia since it is unclear from E. prescribed which one of these medications will be covered by his insurance. He has been instructed to use them one at a time and to let me know which one his insurance will cover. He is overdue for diabetic eye exam. He is no evidence of proteinuria but is on lisinopril for hypertension. He is also taking a statin for control of Crestor and baby aspirin daily. Foot exam was normal.

## 2012-08-13 NOTE — Telephone Encounter (Signed)
Please let cough that after reviewing his cholesterol levels for the last several months think he needs a stronger cholesterol medication. Therefore when he finishes his current pravastatin supply I would like him to start generic Lipitor or atorvastatin,   20 mg daily. I've called this into his pharmacy.

## 2012-08-13 NOTE — Assessment & Plan Note (Signed)
well controlled on current medications. Renal function is normal and stable. No changes today.

## 2012-08-13 NOTE — Progress Notes (Signed)
Patient ID: Andrew Molina, male   DOB: [birth year], 37 y.o.   MRN: [id number]   Patient Active Problem List  Diagnosis  . Hypertension  . Hyperlipidemia LDL goal < 70  . NAFLD (nonalcoholic fatty liver disease)  . Diabetes mellitus type 2, uncontrolled, without complications  . Gastritis due to nonsteroidal anti-inflammatory drug    Subjective:  CC:   Chief Complaint  Patient presents with  . Follow-up    HPI:   Andrew Molina a 37 y.o. male who presents 3 month followup on diabetes mellitus. His recent lab work indicates loss of control. Hemoglobin A1c is now over 8. He has not been following a low glycemic index diet. He does not have any numbness or tingling. He is not symptomatic. He has not had any low blood sugars. He is taking his glipizide and his metformin. He wasusing samples of oOnglyza but ran out and has not had that filled.    Past Medical History  Diagnosis Date  . Other chronic nonalcoholic liver disease   . Hypertension   . Diabetes mellitus 2005  . Hyperlipidemia   . Cataract     Past Surgical History  Procedure Date  . Spine surgery     microdiskectomy         The following portions of the patient's history were reviewed and updated as appropriate: Allergies, current medications, and problem list.    Review of Systems:  Patient denies headache, fevers, malaise, unintentional weight loss, skin rash, eye pain, sinus congestion and sinus pain, sore throat, dysphagia,  hemoptysis , cough, dyspnea, wheezing, chest pain, palpitations, orthopnea, edema, abdominal pain, nausea, melena, diarrhea, constipation, flank pain, dysuria, hematuria, urinary  Frequency, nocturia, numbness, tingling, seizures,  Focal weakness, Loss of consciousness,  Tremor, insomnia, depression, anxiety, and suicidal ideation.     History   Social History  . Marital Status: Married    Spouse Name: N/A    Number of Children: N/A  . Years of Education: N/A   Occupational History  .  Not on file.   Social History Main Topics  . Smoking status: Former Smoker    Quit date: 06/17/2000  . Smokeless tobacco: Current User    Types: Snuff  . Alcohol Use: 3.0 oz/week    5 Cans of beer per week  . Drug Use: No  . Sexually Active: Yes   Other Topics Concern  . Not on file   Social History Narrative  . No narrative on file    Objective:  BP 116/78  Pulse 77  Temp 97.9 F (36.6 C) (Oral)  Resp 16  Wt 180 lb 8 oz (81.874 kg)  SpO2 97%  General appearance: alert, cooperative and appears stated age Ears: normal TM's and external ear canals both ears Throat: lips, mucosa, and tongue normal; teeth and gums normal Neck: no adenopathy, no carotid bruit, supple, symmetrical, trachea midline and thyroid not enlarged, symmetric, no tenderness/mass/nodules Back: symmetric, no curvature. ROM normal. No CVA tenderness. Lungs: clear to auscultation bilaterally Heart: regular rate and rhythm, S1, S2 normal, no murmur, click, rub or gallop Abdomen: soft, non-tender; bowel sounds normal; no masses,  no organomegaly Pulses: 2+ and symmetric Skin: Skin color, texture, turgor normal. No rashes or lesions Lymph nodes: Cervical, supraclavicular, and axillary nodes normal.  Assessment and Plan:  Diabetes mellitus type 2, uncontrolled, without complications Without the third agent his hemoglobin achas  risen to above 8. We will resume onglyza.  He is also been given samples of tradjenta  N. Januvia since it is unclear from E. prescribed which one of these medications will be covered by his insurance. He has been instructed to use them one at a time and to let me know which one his insurance will cover. He is overdue for diabetic eye exam. He is no evidence of proteinuria but is on lisinopril for hypertension. He is also taking a statin for control of Crestor and baby aspirin daily. Foot exam was normal.  Hyperlipidemia LDL goal < 70 His LDL remains above 100 on 40 mg of pravastatin.  We'll change his prescription to generic Lipitor 20 mg daily and repeat lipids in 3 months.  Hypertension well controlled on current medications. Renal function is normal and stable. No changes today.    Updated Medication List Outpatient Encounter Prescriptions as of 08/12/2012  Medication Sig Dispense Refill  . glipiZIDE (GLUCOTROL) 10 MG tablet TAKE ONE TABLET BY MOUTH TWICE DAILY  180 tablet  3  . glucose blood test strip Test blood sugar twice a day  100 each  6  . lisinopril (PRINIVIL,ZESTRIL) 2.5 MG tablet TAKE ONE TABLET BY MOUTH EVERY DAY  90 tablet  2  . metFORMIN (GLUCOPHAGE) 1000 MG tablet TAKE ONE TABLET BY MOUTH TWICE DAILY  180 tablet  3  . saxagliptin HCl (ONGLYZA) 5 MG TABS tablet Take 1 tablet (5 mg total) by mouth daily.  30 tablet  6  . [DISCONTINUED] pravastatin (PRAVACHOL) 40 MG tablet Take 1 tablet (40 mg total) by mouth daily.  90 tablet  3  . atorvastatin (LIPITOR) 20 MG tablet Take 1 tablet (20 mg total) by mouth daily.  90 tablet  3     Orders Placed This Encounter  Procedures  . Ambulatory referral to Ophthalmology  . HM DIABETES EYE EXAM    No Follow-up on file.

## 2012-08-13 NOTE — Assessment & Plan Note (Signed)
His LDL remains above 100 on 40 mg of pravastatin. We'll change his prescription to generic Lipitor 20 mg daily and repeat lipids in 3 months.

## 2012-08-14 ENCOUNTER — Telehealth: Payer: Self-pay | Admitting: Internal Medicine

## 2012-08-14 DIAGNOSIS — E1165 Type 2 diabetes mellitus with hyperglycemia: Secondary | ICD-10-CM

## 2012-08-14 DIAGNOSIS — IMO0002 Reserved for concepts with insufficient information to code with codable children: Secondary | ICD-10-CM

## 2012-08-14 MED ORDER — SAXAGLIPTIN HCL 5 MG PO TABS: 5.0000 mg | ORAL_TABLET | Freq: Every day | ORAL | Status: DC

## 2012-08-14 NOTE — Telephone Encounter (Signed)
What labs would pt need.

## 2012-08-14 NOTE — Telephone Encounter (Signed)
fasting lipids, hgba1c and CMET thanks.[name] i have added.

## 2012-08-14 NOTE — Telephone Encounter (Signed)
Patient wants to know if he should have labs draw before his appointment on 4.11.14.

## 2012-08-15 NOTE — Telephone Encounter (Signed)
Noted  

## 2012-08-17 NOTE — Telephone Encounter (Signed)
LMOVM notifying pt. 

## 2012-08-18 ENCOUNTER — Encounter: Payer: Self-pay | Admitting: Internal Medicine

## 2012-09-06 ENCOUNTER — Encounter: Payer: Self-pay | Admitting: Internal Medicine

## 2012-09-19 ENCOUNTER — Other Ambulatory Visit: Payer: Self-pay

## 2012-11-13 ENCOUNTER — Ambulatory Visit: Payer: 59 | Admitting: Internal Medicine

## 2012-11-23 ENCOUNTER — Encounter: Payer: Self-pay | Admitting: *Deleted

## 2012-11-23 ENCOUNTER — Encounter: Payer: Self-pay | Admitting: Internal Medicine

## 2012-11-23 ENCOUNTER — Other Ambulatory Visit: Payer: Self-pay | Admitting: *Deleted

## 2012-11-23 ENCOUNTER — Ambulatory Visit (INDEPENDENT_AMBULATORY_CARE_PROVIDER_SITE_OTHER): Payer: 59 | Admitting: Internal Medicine

## 2012-11-23 VITALS — BP 118/88 | HR 74 | Temp 97.6°F | Resp 14 | Wt 172.8 lb

## 2012-11-23 DIAGNOSIS — E1165 Type 2 diabetes mellitus with hyperglycemia: Secondary | ICD-10-CM

## 2012-11-23 DIAGNOSIS — E785 Hyperlipidemia, unspecified: Secondary | ICD-10-CM

## 2012-11-23 DIAGNOSIS — I10 Essential (primary) hypertension: Secondary | ICD-10-CM

## 2012-11-23 DIAGNOSIS — F411 Generalized anxiety disorder: Secondary | ICD-10-CM

## 2012-11-23 DIAGNOSIS — IMO0001 Reserved for inherently not codable concepts without codable children: Secondary | ICD-10-CM

## 2012-11-23 DIAGNOSIS — IMO0002 Reserved for concepts with insufficient information to code with codable children: Secondary | ICD-10-CM

## 2012-11-23 MED ORDER — ALPRAZOLAM 0.5 MG PO TABS: 0.5000 mg | ORAL_TABLET | Freq: Every evening | ORAL | Status: DC | PRN

## 2012-11-23 NOTE — Assessment & Plan Note (Signed)
Well controlled on current regimen. Renal due, no changes today.

## 2012-11-23 NOTE — Assessment & Plan Note (Signed)
Trial of alprazolam. Risk and benefits of medication discussed.

## 2012-11-23 NOTE — Progress Notes (Signed)
Patient ID: Andrew Molina, male   DOB: [birth year], 37 y.o.   MRN: [id number]  . Patient Active Problem List  Diagnosis  . Hypertension  . Hyperlipidemia LDL goal < 70  . NAFLD (nonalcoholic fatty liver disease)  . Diabetes mellitus type 2, uncontrolled, without complications  . Gastritis due to nonsteroidal anti-inflammatory drug  . Anxiety state, unspecified    Subjective:  CC:   Chief Complaint  Patient presents with  . Follow-up    HPI:       Andrew Molina a 37 y.o. male who presents for  3 month follow up on diabetes and hyperlipidemia,  He reorts improved glycemic control with onglyza.,  Has lost 8 lbs.   Has reduced the glipizde to 1/2 tablet in the am. Highest morning bs is 174 .  Evening 215  New baby one month old.  He is getting less sleep.  feeling really tired all the day .,  Could fall asleep anytime.  Sleepiness Started before the baby was born. Cites Increased stressors at work,  Anxiety  is out of control .  Having panic attacks. Several times per week.  Requesting medication for prn use.   Past Medical History  Diagnosis Date  . Other chronic nonalcoholic liver disease   . Hypertension   . Diabetes mellitus 2005  . Hyperlipidemia   . Cataract     Past Surgical History  Procedure Laterality Date  . Spine surgery      microdiskectomy       The following portions of the patient's history were reviewed and updated as appropriate: Allergies, current medications, and problem list.    Review of Systems:  Patient denies headache, fevers, malaise, unintentional weight loss, skin rash, eye pain, sinus congestion and sinus pain, sore throat, dysphagia,  hemoptysis , cough, dyspnea, wheezing, chest pain, palpitations, orthopnea, edema, abdominal pain, nausea, melena, diarrhea, constipation, flank pain, dysuria, hematuria, urinary  Frequency, nocturia, numbness, tingling, seizures,  Focal weakness, Loss of consciousness,  Tremor, insomnia, depression, anxiety, and  suicidal ideation.      History   Social History  . Marital Status: Married    Spouse Name: N/A    Number of Children: N/A  . Years of Education: N/A   Occupational History  . Not on file.   Social History Main Topics  . Smoking status: Former Smoker    Quit date: 06/17/2000  . Smokeless tobacco: Current User    Types: Snuff  . Alcohol Use: 3.0 oz/week    5 Cans of beer per week  . Drug Use: No  . Sexually Active: Yes   Other Topics Concern  . Not on file   Social History Narrative  . No narrative on file    Objective:  BP 118/88  Pulse 74  Temp(Src) 97.6 F (36.4 C) (Oral)  Resp 14  Wt 172 lb 12 oz (78.359 kg)  BMI 24.79 kg/m2  SpO2 98%  General appearance: alert, cooperative and appears stated age Ears: normal TM's and external ear canals both ears Throat: lips, mucosa, and tongue normal; teeth and gums normal Neck: no adenopathy, no carotid bruit, supple, symmetrical, trachea midline and thyroid not enlarged, symmetric, no tenderness/mass/nodules Back: symmetric, no curvature. ROM normal. No CVA tenderness. Lungs: clear to auscultation bilaterally Heart: regular rate and rhythm, S1, S2 normal, no murmur, click, rub or gallop Abdomen: soft, non-tender; bowel sounds normal; no masses,  no organomegaly Pulses: 2+ and symmetric Skin: Skin color, texture, turgor normal. No rashes or lesions  Lymph nodes: Cervical, supraclavicular, and axillary nodes normal.  Assessment and Plan:  Hypertension Well controlled on current regimen. Renal due, no changes today.  Hyperlipidemia LDL goal < 70 Well controlled on current regimen. , no changes today.  Diabetes mellitus type 2, uncontrolled, without complications Controlled with minimal medications,  hgb1c is pending.  No changes to medications.  Low GI diet reviewed,  up to date on eye exams and foot exams  .  Anxiety state, unspecified Trial of alprazolam. Risk and benefits of medication discussed.    Updated  Medication List Outpatient Encounter Prescriptions as of 11/23/2012  Medication Sig Dispense Refill  . atorvastatin (LIPITOR) 20 MG tablet Take 1 tablet (20 mg total) by mouth daily.  90 tablet  3  . glipiZIDE (GLUCOTROL) 10 MG tablet TAKE ONE TABLET BY MOUTH TWICE DAILY  180 tablet  3  . glucose blood test strip Test blood sugar twice a day  100 each  6  . lisinopril (PRINIVIL,ZESTRIL) 2.5 MG tablet TAKE ONE TABLET BY MOUTH EVERY DAY  90 tablet  2  . metFORMIN (GLUCOPHAGE) 1000 MG tablet TAKE ONE TABLET BY MOUTH TWICE DAILY  180 tablet  3  . naproxen sodium (ANAPROX) 220 MG tablet Take 220 mg by mouth 2 (two) times daily with a meal.      . saxagliptin HCl (ONGLYZA) 5 MG TABS tablet Take 1 tablet (5 mg total) by mouth daily.  30 tablet  11  . saxagliptin HCl (ONGLYZA) 5 MG TABS tablet Take 1 tablet (5 mg total) by mouth daily.  30 tablet  6  . ALPRAZolam (XANAX) 0.5 MG tablet Take 1 tablet (0.5 mg total) by mouth at bedtime as needed for sleep or anxiety.  30 tablet  3   No facility-administered encounter medications on file as of 11/23/2012.     No orders of the defined types were placed in this encounter.    Return in about 3 months (around 02/22/2013).

## 2012-11-23 NOTE — Assessment & Plan Note (Signed)
Well controlled on current regimen, no changes today. 

## 2012-11-23 NOTE — Assessment & Plan Note (Signed)
Controlled with minimal medications,  hgb1c is pending.  No changes to medications.  Low GI diet reviewed,  up to date on eye exams and foot exams  .

## 2012-11-23 NOTE — Patient Instructions (Addendum)
Trial of alprazolam for acute anxiety.  You may start with 1/2 tablet,  Take first dose while home and not drinking or driving   If daily use becomes a habit,  We should consider adding zoloft,  celexa or lexapro on a daily basis.   Next time fasting labs  Same day

## 2012-11-24 LAB — COMPREHENSIVE METABOLIC PANEL
ALT: 63 U/L — ABNORMAL HIGH (ref 0–53)
AST: 38 U/L — ABNORMAL HIGH (ref 0–37)
Albumin: 4.9 g/dL (ref 3.5–5.2)
Alkaline Phosphatase: 46 U/L (ref 39–117)
Potassium: 4.7 mEq/L (ref 3.5–5.1)
Sodium: 137 mEq/L (ref 135–145)
Total Protein: 7.5 g/dL (ref 6.0–8.3)

## 2012-11-24 LAB — LIPID PANEL
LDL Cholesterol: 76 mg/dL (ref 0–99)
Total CHOL/HDL Ratio: 4
VLDL: 24.4 mg/dL (ref 0.0–40.0)

## 2012-11-24 NOTE — Addendum Note (Signed)
Addended by: Montine Circle D on: 11/24/2012 09:34 AM   Modules accepted: Orders

## 2012-11-25 ENCOUNTER — Encounter: Payer: Self-pay | Admitting: Internal Medicine

## 2012-12-20 ENCOUNTER — Other Ambulatory Visit: Payer: Self-pay | Admitting: Internal Medicine

## 2012-12-21 NOTE — Telephone Encounter (Signed)
Rx sent to pharmacy by escript  

## 2013-02-22 ENCOUNTER — Ambulatory Visit (INDEPENDENT_AMBULATORY_CARE_PROVIDER_SITE_OTHER): Payer: 59 | Admitting: Internal Medicine

## 2013-02-22 ENCOUNTER — Encounter: Payer: Self-pay | Admitting: Internal Medicine

## 2013-02-22 DIAGNOSIS — E1149 Type 2 diabetes mellitus with other diabetic neurological complication: Secondary | ICD-10-CM

## 2013-02-22 DIAGNOSIS — I10 Essential (primary) hypertension: Secondary | ICD-10-CM

## 2013-02-22 DIAGNOSIS — Z720 Tobacco use: Secondary | ICD-10-CM

## 2013-02-22 DIAGNOSIS — Z87891 Personal history of nicotine dependence: Secondary | ICD-10-CM | POA: Insufficient documentation

## 2013-02-22 DIAGNOSIS — E785 Hyperlipidemia, unspecified: Secondary | ICD-10-CM

## 2013-02-22 DIAGNOSIS — F17200 Nicotine dependence, unspecified, uncomplicated: Secondary | ICD-10-CM

## 2013-02-22 DIAGNOSIS — IMO0001 Reserved for inherently not codable concepts without codable children: Secondary | ICD-10-CM

## 2013-02-22 MED ORDER — DOXYCYCLINE HYCLATE 100 MG PO TABS: 100.0000 mg | ORAL_TABLET | Freq: Two times a day (BID) | ORAL | Status: AC

## 2013-02-22 NOTE — Progress Notes (Signed)
Patient ID: Andrew Molina, male   DOB: 24-Oct-1975, 37 y.o.   MRN: [id number]  Patient Active Problem List   Diagnosis Date Noted  . Anxiety state, unspecified 11/23/2012  . Diabetes mellitus type 2, uncontrolled, without complications 05/06/2012  . Gastritis due to nonsteroidal anti-inflammatory drug 05/06/2012  . NAFLD (nonalcoholic fatty liver disease) 40/98/1191  . Hypertension   . Hyperlipidemia LDL goal < 70     Subjective:  CC:   No chief complaint on file.   HPI:   Andrew Molina a 37 y.o. male who presents  For 3 month followup on diabetes mellitus well-controlled. He has no new issues. His last A1c was 7.13 months ago. He does not check his sugars regularly but has noted some elevations when he does check secondary to dietary noncompliance with low glycemic index diet. He's very active physically. He has not had any low blood sugars. He is tolerating his medications well. He has had some tick bites recently and has been keeping tract of them. He has not any fevers rash or headaches.    Past Medical History  Diagnosis Date  . Other chronic nonalcoholic liver disease   . Hypertension   . Diabetes mellitus 2005  . Hyperlipidemia   . Cataract     Past Surgical History  Procedure Laterality Date  . Spine surgery      microdiskectomy       The following portions of the patient's history were reviewed and updated as appropriate: Allergies, current medications, and problem list.    Review of Systems:   12 Pt  review of systems was negative except those addressed in the HPI,     History   Social History  . Marital Status: Married    Spouse Name: N/A    Number of Children: N/A  . Years of Education: N/A   Occupational History  . Not on file.   Social History Main Topics  . Smoking status: Former Smoker    Quit date: 06/17/2000  . Smokeless tobacco: Current User    Types: Snuff  . Alcohol Use: 3.0 oz/week    5 Cans of beer per week  . Drug Use: No  .  Sexually Active: Yes   Other Topics Concern  . Not on file   Social History Narrative  . No narrative on file    Objective:  There were no vitals taken for this visit.  General appearance: alert, cooperative and appears stated age Ears: normal TM's and external ear canals both ears Throat: lips, mucosa, and tongue normal; teeth and gums normal Neck: no adenopathy, no carotid bruit, supple, symmetrical, trachea midline and thyroid not enlarged, symmetric, no tenderness/mass/nodules Back: symmetric, no curvature. ROM normal. No CVA tenderness. Lungs: clear to auscultation bilaterally Heart: regular rate and rhythm, S1, S2 normal, no murmur, click, rub or gallop Abdomen: soft, non-tender; bowel sounds normal; no masses,  no organomegaly Pulses: 2+ and symmetric Skin: Skin color, texture, turgor normal. No rashes or lesions Lymph nodes: Cervical, supraclavicular, and axillary nodes normal. Foot exam:  Nails are well trimmed,  No callouses,  Sensation intact to microfilament   Assessment and Plan:  Diabetes mellitus type 2, uncontrolled, without complications Well-controlled on current medications.  hemoglobin A1c has been consistently at or around  7.0 . He is up-to-date on eye exams and his foot exam is norma. l we'll repeat his urine microalbumin to creatinine ratio at next lab draw He is on the appropriate medications.  Hyperlipidemia LDL goal <  70 LDL was 78 3 months ago on current Lipitor dose. Continue current medications. He has no symptoms of myalgias.  He has LFT elevation historically due to history of fatty liver  Hypertension Well controlled on current regimen. Renal function stable, no changes today.  Tobacco abuse counseling For years and has recently changed to Vapor cigarettes. He is asking for my opinion on the vapor cigarettes.  Told him that the pulmonologist were concerned about the effects on the lungs of these tools for tobacco cessation. I recommended that he  try to switch to NicoDerm transdermal since he has not smoked smoking currently but uses snuff.   Updated Medication List Outpatient Encounter Prescriptions as of 02/22/2013  Medication Sig Dispense Refill  . ALPRAZolam (XANAX) 0.5 MG tablet Take 1 tablet (0.5 mg total) by mouth at bedtime as needed for sleep or anxiety.  30 tablet  3  . atorvastatin (LIPITOR) 20 MG tablet Take 1 tablet (20 mg total) by mouth daily.  90 tablet  3  . doxycycline (VIBRA-TABS) 100 MG tablet Take 1 tablet (100 mg total) by mouth 2 (two) times daily.  14 tablet  0  . glipiZIDE (GLUCOTROL) 10 MG tablet TAKE ONE TABLET BY MOUTH TWICE DAILY  180 tablet  3  . glucose blood test strip Test blood sugar twice a day  100 each  6  . lisinopril (PRINIVIL,ZESTRIL) 2.5 MG tablet TAKE ONE TABLET BY MOUTH EVERY DAY  90 tablet  2  . metFORMIN (GLUCOPHAGE) 1000 MG tablet TAKE ONE TABLET BY MOUTH TWICE DAILY  180 tablet  1  . naproxen sodium (ANAPROX) 220 MG tablet Take 220 mg by mouth 2 (two) times daily with a meal.      . saxagliptin HCl (ONGLYZA) 5 MG TABS tablet Take 1 tablet (5 mg total) by mouth daily.  30 tablet  11  . saxagliptin HCl (ONGLYZA) 5 MG TABS tablet Take 1 tablet (5 mg total) by mouth daily.  30 tablet  6   No facility-administered encounter medications on file as of 02/22/2013.

## 2013-02-22 NOTE — Assessment & Plan Note (Signed)
For years and has recently changed to Vapor cigarettes. He is asking for my opinion on the vapor cigarettes.  Told him that the pulmonologist were concerned about the effects on the lungs of these tools for tobacco cessation. I recommended that he try to switch to NicoDerm transdermal since he has not smoked smoking currently but uses snuff.

## 2013-02-22 NOTE — Assessment & Plan Note (Signed)
Well controlled on current regimen. Renal function stable, no changes today. 

## 2013-02-22 NOTE — Assessment & Plan Note (Signed)
LDL was 78 3 months ago on current Lipitor dose. Continue current medications. He has no symptoms of myalgias.  He has LFT elevation historically due to history of fatty liver

## 2013-02-22 NOTE — Assessment & Plan Note (Signed)
Well-controlled on current medications.  hemoglobin A1c has been consistently at or around  7.0 . He is up-to-date on eye exams and his foot exam is norma. l we'll repeat his urine microalbumin to creatinine ratio at next lab draw He is on the appropriate medications.

## 2013-03-01 ENCOUNTER — Other Ambulatory Visit: Payer: Self-pay | Admitting: *Deleted

## 2013-03-02 MED ORDER — GLIPIZIDE 10 MG PO TABS: ORAL_TABLET | ORAL | Status: DC

## 2013-03-22 ENCOUNTER — Ambulatory Visit (INDEPENDENT_AMBULATORY_CARE_PROVIDER_SITE_OTHER): Payer: No Typology Code available for payment source | Admitting: Internal Medicine

## 2013-03-22 ENCOUNTER — Encounter: Payer: Self-pay | Admitting: Internal Medicine

## 2013-03-22 VITALS — BP 126/72 | HR 70 | Temp 97.8°F | Resp 14 | Wt 174.2 lb

## 2013-03-22 DIAGNOSIS — R209 Unspecified disturbances of skin sensation: Secondary | ICD-10-CM

## 2013-03-22 DIAGNOSIS — R200 Anesthesia of skin: Secondary | ICD-10-CM

## 2013-03-22 DIAGNOSIS — IMO0001 Reserved for inherently not codable concepts without codable children: Secondary | ICD-10-CM

## 2013-03-22 DIAGNOSIS — E1149 Type 2 diabetes mellitus with other diabetic neurological complication: Secondary | ICD-10-CM

## 2013-03-22 LAB — COMPREHENSIVE METABOLIC PANEL
Albumin: 4.7 g/dL (ref 3.5–5.2)
BUN: 18 mg/dL (ref 6–23)
CO2: 23 mEq/L (ref 19–32)
Calcium: 9.3 mg/dL (ref 8.4–10.5)
Chloride: 101 mEq/L (ref 96–112)
Creatinine, Ser: 0.7 mg/dL (ref 0.4–1.5)
GFR: 126.23 mL/min (ref >60.00)
Glucose, Bld: 244 mg/dL — ABNORMAL HIGH (ref 70–99)

## 2013-03-22 LAB — VITAMIN B12: Vitamin B-12: 265 pg/mL (ref 211–911)

## 2013-03-22 LAB — LIPID PANEL
HDL: 36.6 mg/dL — ABNORMAL LOW (ref >39.00)
LDL Cholesterol: 72 mg/dL (ref 0–99)
Total CHOL/HDL Ratio: 4
Triglycerides: 125 mg/dL (ref 0.0–149.0)
VLDL: 25 mg/dL (ref 0.0–40.0)

## 2013-03-22 LAB — MICROALBUMIN / CREATININE URINE RATIO: Creatinine,U: 155 mg/dL

## 2013-03-22 NOTE — Assessment & Plan Note (Addendum)
Currently managed with metformin glipizide and onglyza.   is on an ACE inhibitor. He is on a statin. Foot exam today did note relative loss of sensation to light touch which was inconsistent. Strength was normal and circulation was normal. Fasting blood work has been done for today. Will recommend baby aspirin to be added to regimen.

## 2013-03-22 NOTE — Assessment & Plan Note (Signed)
I explained to him that his current symptoms and exam are not consistent with diabetic neuropathy since his loss of sensation is not symmetric. He may have a peroneal nerve distribution neuropathy or a compressive neuropathy from his lumbar disc herniation. His symptoms at present are mild. I have ordered TSH B12 RPR and folate or levels to rule out personable causes. I have offered gabapentin which he does not feel he needs at this time. Further workup would include EMG/nerve conduction studies which he wants to postpone for now.

## 2013-03-22 NOTE — Progress Notes (Signed)
Patient ID: Andrew Molina, male   DOB: 09/10/1975, 37 y.o.   MRN: [id number]  Patient Active Problem List   Diagnosis Date Noted  . Numbness and tingling of foot 03/22/2013  . Tobacco abuse counseling 02/22/2013  . Anxiety state, unspecified 11/23/2012  . Diabetes mellitus type 2, uncontrolled, without complications 05/06/2012  . Gastritis due to nonsteroidal anti-inflammatory drug 05/06/2012  . NAFLD (nonalcoholic fatty liver disease) 40/98/1191  . Hypertension   . Hyperlipidemia LDL goal < 70     Subjective:  CC:   Chief Complaint  Patient presents with  . Acute Visit    Numbness in left foot X 2 Weeks    HPI:   Andrew Molina a 37 y.o. male who presents with a 2 week history of progressive  numbness in left foot which started over the left great toe and 2nd toe and spead to the plantar surface of his left forefoot and now involves left lateral ankle . No pain in foot,  no history of trauma.    Feeling is not described as pain but is unpleasant bc of the "tingling sensation" .   Has DM Type 2 for six years with  Periods of poor control,  And has a history of lumbar spine surgery over 10 years ago with persistent low back pain and loss of peripheral reflexes.  At his last OV he was taking meds as directed.  but diet had not been following a low glycemic index diet. He has been more compliant since his foot developed numbness. He denies weakness,  Foot drop and loss of sleep from symptoms.    Past Medical History  Diagnosis Date  . Other chronic nonalcoholic liver disease   . Hypertension   . Diabetes mellitus 2005  . Hyperlipidemia   . Cataract     Past Surgical History  Procedure Laterality Date  . Spine surgery      microdiskectomy       The following portions of the patient's history were reviewed and updated as appropriate: Allergies, current medications, and problem list.    Review of Systems:   Patient denies headache, fevers, malaise, unintentional weight loss,  skin rash, eye pain, sinus congestion and sinus pain, sore throat, dysphagia,  hemoptysis , cough, dyspnea, wheezing, chest pain, palpitations, orthopnea, edema, abdominal pain, nausea, melena, diarrhea, constipation, flank pain, dysuria, hematuria, urinary  Frequency, nocturia, numbness, tingling, seizures,  Focal weakness, Loss of consciousness,  Tremor, insomnia, depression, anxiety, and suicidal ideation.      History   Social History  . Marital Status: Married    Spouse Name: N/A    Number of Children: N/A  . Years of Education: N/A   Occupational History  . Not on file.   Social History Main Topics  . Smoking status: Former Smoker    Quit date: 06/17/2000  . Smokeless tobacco: Former Neurosurgeon    Types: Snuff    Quit date: 01/04/2013  . Alcohol Use: 3.0 oz/week    5 Cans of beer per week  . Drug Use: No  . Sexual Activity: Yes   Other Topics Concern  . Not on file   Social History Narrative  . No narrative on file    Objective:  Filed Vitals:   03/22/13 0806  BP: 126/72  Pulse: 70  Temp: 97.8 F (36.6 C)  Resp: 14     General appearance: alert, cooperative and appears stated age Ears: normal TM's and external ear canals both ears Throat: lips, mucosa, and  tongue normal; teeth and gums normal Neck: no adenopathy, no carotid bruit, supple, symmetrical, trachea midline and thyroid not enlarged, symmetric, no tenderness/mass/nodules Back: symmetric, no curvature. ROM normal. No CVA tenderness. Lungs: clear to auscultation bilaterally Heart: regular rate and rhythm, S1, S2 normal, no murmur, click, rub or gallop Abdomen: soft, non-tender; bowel sounds normal; no masses,  no organomegaly Pulses: 2+ and symmetric Skin: Skin color, texture, turgor normal. No rashes or lesions Lymph nodes: Cervical, supraclavicular, and axillary nodes normal. Neuro: grossly normal except for: Decreased sensation to light touch on the lateral side of left leg from the mid tibia to great  toe.  decreased sensation on plantar sent surface of left foot Absent Achilles reflex bilaterally trace patellar reflex bilaterally.   A total of 30 minutes of face to face time was spent with patient more than half of which was spent in counselling and coordination of care   Assessment and Plan:  Numbness and tingling of foot I explained to him that his current symptoms and exam are not consistent with diabetic neuropathy since his loss of sensation is not symmetric. He may have a peroneal nerve distribution neuropathy or a compressive neuropathy from his lumbar disc herniation. His symptoms at present are mild. I have ordered TSH B12 RPR and folate or levels to rule out personable causes. I have offered gabapentin which he does not feel he needs at this time. Further workup would include EMG/nerve conduction studies which he wants to postpone for now.  Diabetes mellitus type 2, uncontrolled, without complications Currently managed with metformin glipizide and onglyza.   is on an ACE inhibitor. He is on a statin. Foot exam today did note relative loss of sensation to light touch which was inconsistent. Strength was normal and circulation was normal. Fasting blood work has been done for today. Will recommend baby aspirin to be added to regimen.   Updated Medication List Outpatient Encounter Prescriptions as of 03/22/2013  Medication Sig Dispense Refill  . ALPRAZolam (XANAX) 0.5 MG tablet Take 1 tablet (0.5 mg total) by mouth at bedtime as needed for sleep or anxiety.  30 tablet  3  . atorvastatin (LIPITOR) 20 MG tablet Take 1 tablet (20 mg total) by mouth daily.  90 tablet  3  . glipiZIDE (GLUCOTROL) 10 MG tablet TAKE ONE TABLET BY MOUTH TWICE DAILY  180 tablet  1  . glucose blood test strip Test blood sugar twice a day  100 each  6  . lisinopril (PRINIVIL,ZESTRIL) 2.5 MG tablet TAKE ONE TABLET BY MOUTH EVERY DAY  90 tablet  2  . metFORMIN (GLUCOPHAGE) 1000 MG tablet TAKE ONE TABLET BY MOUTH  TWICE DAILY  180 tablet  1  . naproxen sodium (ANAPROX) 220 MG tablet Take 220 mg by mouth 2 (two) times daily with a meal.      . saxagliptin HCl (ONGLYZA) 5 MG TABS tablet Take 1 tablet (5 mg total) by mouth daily.  30 tablet  11  . [DISCONTINUED] saxagliptin HCl (ONGLYZA) 5 MG TABS tablet Take 1 tablet (5 mg total) by mouth daily.  30 tablet  6   No facility-administered encounter medications on file as of 03/22/2013.     Orders Placed This Encounter  Procedures  . B12  . RPR  . Folate RBC  . TSH + free T4    No Follow-up on file.

## 2013-03-23 ENCOUNTER — Encounter: Payer: Self-pay | Admitting: Internal Medicine

## 2013-03-23 LAB — FOLATE RBC: RBC Folate: 825 ng/mL (ref >=366)

## 2013-03-23 LAB — RPR

## 2013-04-12 ENCOUNTER — Encounter: Payer: Self-pay | Admitting: Internal Medicine

## 2013-04-23 ENCOUNTER — Other Ambulatory Visit: Payer: Self-pay | Admitting: *Deleted

## 2013-04-23 MED ORDER — ALPRAZOLAM 0.5 MG PO TABS: 0.5000 mg | ORAL_TABLET | Freq: Every evening | ORAL | Status: DC | PRN

## 2013-04-23 NOTE — Telephone Encounter (Signed)
Rx faxed

## 2013-06-10 ENCOUNTER — Other Ambulatory Visit: Payer: Self-pay

## 2013-08-18 ENCOUNTER — Other Ambulatory Visit: Payer: Self-pay | Admitting: Internal Medicine

## 2013-08-27 ENCOUNTER — Other Ambulatory Visit: Payer: Self-pay | Admitting: Internal Medicine

## 2013-09-26 ENCOUNTER — Other Ambulatory Visit: Payer: Self-pay | Admitting: Internal Medicine

## 2013-11-25 ENCOUNTER — Other Ambulatory Visit: Payer: Self-pay | Admitting: *Deleted

## 2013-11-25 NOTE — Telephone Encounter (Signed)
Electronic Rx request, Please advise.

## 2013-11-26 ENCOUNTER — Other Ambulatory Visit: Payer: Self-pay | Admitting: Internal Medicine

## 2013-11-26 MED ORDER — ALPRAZOLAM 0.5 MG PO TABS: 0.5000 mg | ORAL_TABLET | Freq: Every evening | ORAL | Status: DC | PRN

## 2013-11-26 NOTE — Progress Notes (Signed)
Refill sent.

## 2013-12-17 ENCOUNTER — Other Ambulatory Visit: Payer: Self-pay | Admitting: Internal Medicine

## 2014-01-18 ENCOUNTER — Other Ambulatory Visit: Payer: Self-pay | Admitting: Internal Medicine

## 2014-02-17 ENCOUNTER — Other Ambulatory Visit: Payer: Self-pay | Admitting: Internal Medicine

## 2014-04-20 ENCOUNTER — Other Ambulatory Visit: Payer: Self-pay | Admitting: Internal Medicine

## 2014-04-25 ENCOUNTER — Other Ambulatory Visit: Payer: Self-pay | Admitting: Internal Medicine

## 2014-05-02 ENCOUNTER — Other Ambulatory Visit: Payer: Self-pay | Admitting: Internal Medicine

## 2014-05-02 MED ORDER — METFORMIN HCL 1000 MG PO TABS: ORAL_TABLET | ORAL | Status: DC

## 2014-05-09 ENCOUNTER — Ambulatory Visit: Payer: No Typology Code available for payment source | Admitting: Internal Medicine

## 2014-05-27 ENCOUNTER — Encounter: Payer: Self-pay | Admitting: Internal Medicine

## 2014-05-27 ENCOUNTER — Ambulatory Visit (INDEPENDENT_AMBULATORY_CARE_PROVIDER_SITE_OTHER): Payer: No Typology Code available for payment source | Admitting: Internal Medicine

## 2014-05-27 VITALS — BP 118/64 | HR 82 | Temp 98.2°F | Resp 16 | Ht 70.0 in | Wt 171.5 lb

## 2014-05-27 DIAGNOSIS — K760 Fatty (change of) liver, not elsewhere classified: Secondary | ICD-10-CM

## 2014-05-27 DIAGNOSIS — Z23 Encounter for immunization: Secondary | ICD-10-CM

## 2014-05-27 DIAGNOSIS — E1165 Type 2 diabetes mellitus with hyperglycemia: Secondary | ICD-10-CM

## 2014-05-27 DIAGNOSIS — I10 Essential (primary) hypertension: Secondary | ICD-10-CM

## 2014-05-27 DIAGNOSIS — IMO0001 Reserved for inherently not codable concepts without codable children: Secondary | ICD-10-CM

## 2014-05-27 DIAGNOSIS — E785 Hyperlipidemia, unspecified: Secondary | ICD-10-CM

## 2014-05-27 DIAGNOSIS — IMO0002 Reserved for concepts with insufficient information to code with codable children: Secondary | ICD-10-CM

## 2014-05-27 LAB — MICROALBUMIN / CREATININE URINE RATIO
Creatinine,U: 59.6 mg/dL
Microalb Creat Ratio: 12.1 mg/g (ref 0.0–30.0)
Microalb, Ur: 7.2 mg/dL — ABNORMAL HIGH (ref 0.0–1.9)

## 2014-05-27 LAB — COMPREHENSIVE METABOLIC PANEL
ALBUMIN: 4.1 g/dL (ref 3.5–5.2)
ALT: 55 U/L — AB (ref 0–53)
AST: 31 U/L (ref 0–37)
Alkaline Phosphatase: 62 U/L (ref 39–117)
BUN: 14 mg/dL (ref 6–23)
CO2: 18 mEq/L — ABNORMAL LOW (ref 19–32)
Calcium: 9.6 mg/dL (ref 8.4–10.5)
Chloride: 100 mEq/L (ref 96–112)
Creatinine, Ser: 0.7 mg/dL (ref 0.4–1.5)
GFR: 127.42 mL/min (ref >60.00)
Glucose, Bld: 305 mg/dL — ABNORMAL HIGH (ref 70–99)
Potassium: 4.3 mEq/L (ref 3.5–5.1)
SODIUM: 135 meq/L (ref 135–145)
TOTAL PROTEIN: 7.4 g/dL (ref 6.0–8.3)
Total Bilirubin: 0.5 mg/dL (ref 0.2–1.2)

## 2014-05-27 LAB — HM DIABETES FOOT EXAM: HM Diabetic Foot Exam: NORMAL

## 2014-05-27 LAB — HEMOGLOBIN A1C: Hgb A1c MFr Bld: 9.3 % — ABNORMAL HIGH (ref 4.6–6.5)

## 2014-05-27 MED ORDER — METFORMIN HCL 1000 MG PO TABS: ORAL_TABLET | ORAL | Status: DC

## 2014-05-27 MED ORDER — GLIPIZIDE 10 MG PO TABS: ORAL_TABLET | ORAL | Status: DC

## 2014-05-27 MED ORDER — CYANOCOBALAMIN 1000 MCG SL SUBL: 1.0000 | SUBLINGUAL_TABLET | Freq: Every day | SUBLINGUAL | Status: DC

## 2014-05-27 MED ORDER — ALPRAZOLAM 0.5 MG PO TABS: 0.5000 mg | ORAL_TABLET | Freq: Every evening | ORAL | Status: DC | PRN

## 2014-05-27 MED ORDER — SITAGLIPTIN PHOSPHATE 100 MG PO TABS: 100.0000 mg | ORAL_TABLET | Freq: Every day | ORAL | Status: DC

## 2014-05-27 NOTE — Patient Instructions (Signed)
I have refilled the glipizide and metformin  Check with your insurance company about which medication they cover:  Onglyza [place] tradjenta  They are all similar in action for diabetes.    The coupon is for a fre 30 day supply of januvia  YOU ARE OVERDUE FOR EYE EXAM

## 2014-05-27 NOTE — Progress Notes (Signed)
Pre-visit discussion using our clinic review tool. No additional management support is needed unless otherwise documented below in the visit note.  

## 2014-05-27 NOTE — Progress Notes (Addendum)
Patient ID: Andrew Molina, male   DOB: Oct 20, 1975, 38 y.o.   MRN: [id number] Patient Active Problem List   Diagnosis Date Noted  . Numbness and tingling of foot 03/22/2013  . Tobacco abuse counseling 02/22/2013  . Anxiety state, unspecified 11/23/2012  . Diabetes mellitus type 2, uncontrolled, without complications 45/10/8880  . Gastritis due to nonsteroidal anti-inflammatory drug 05/06/2012  . NAFLD (nonalcoholic fatty liver disease) 09/22/2011  . Hypertension   . Hyperlipidemia with target LDL less than 70     Subjective:  CC:   Chief Complaint  Patient presents with  . Follow-up    medication refills    HPI:   Andrew Molina is a 38 y.o. male who presents for follow up on DM type 2 uncontrolled .  He has been lost to follow up for over 14 months,  His medications were not refilled in order to force him to make an appt.  He has been off of glipizide for several weeks and not following a low glycemic index diet.  The cost of onglyza also increased from $10 to $50 so he has not been taking it either,    Past Medical History  Diagnosis Date  . Other chronic nonalcoholic liver disease   . Hypertension   . Diabetes mellitus 2005  . Hyperlipidemia   . Cataract     Past Surgical History  Procedure Laterality Date  . Spine surgery      microdiskectomy       The following portions of the patient's history were reviewed and updated as appropriate: Allergies, current medications, and problem list.    Review of Systems:   Patient denies headache, fevers, malaise, unintentional weight loss, skin rash, eye pain, sinus congestion and sinus pain, sore throat, dysphagia,  hemoptysis , cough, dyspnea, wheezing, chest pain, palpitations, orthopnea, edema, abdominal pain, nausea, melena, diarrhea, constipation, flank pain, dysuria, hematuria, urinary  Frequency, nocturia, numbness, tingling, seizures,  Focal weakness, Loss of consciousness,  Tremor, insomnia, depression, anxiety, and  suicidal ideation.     History   Social History  . Marital Status: Married    Spouse Name: N/A    Number of Children: N/A  . Years of Education: N/A   Occupational History  . Not on file.   Social History Main Topics  . Smoking status: Former Smoker    Quit date: 06/17/2000  . Smokeless tobacco: Former Systems developer    Types: Snuff    Quit date: 01/04/2013  . Alcohol Use: 3.0 oz/week    5 Cans of beer per week  . Drug Use: No  . Sexual Activity: Yes   Other Topics Concern  . Not on file   Social History Narrative  . No narrative on file    Objective:  Filed Vitals:   05/27/14 1137  BP: 118/64  Pulse: 82  Temp: 98.2 F (36.8 C)  Resp: 16     General appearance: alert, cooperative and appears stated age Ears: normal TM's and external ear canals both ears Throat: lips, mucosa, and tongue normal; teeth and gums normal Neck: no adenopathy, no carotid bruit, supple, symmetrical, trachea midline and thyroid not enlarged, symmetric, no tenderness/mass/nodules Back: symmetric, no curvature. ROM normal. No CVA tenderness. Lungs: clear to auscultation bilaterally Heart: regular rate and rhythm, S1, S2 normal, no murmur, click, rub or gallop Abdomen: soft, non-tender; bowel sounds normal; no masses,  no organomegaly Pulses: 2+ and symmetric Skin: Skin color, texture, turgor normal. No rashes or lesions Lymph nodes: Cervical,  supraclavicular, and axillary nodes normal.  Assessment and Plan:  Hypertension Well controlled on current regimen. Renal function is due, but he prefers to have labs done at  [facility].  Lab Results  Component Value Date   CREATININE 0.7 03/22/2013   Lab Results  Component Value Date   NA 134* 03/22/2013   K 4.4 03/22/2013   CL 101 03/22/2013   CO2 23 03/22/2013     NAFLD (nonalcoholic fatty liver disease) Managed with statin and control of diabetes,  Liver enzymes are overdue.   Diabetes mellitus type 2, uncontrolled, without  complications Resuming metformin, glipizide and adding januvia for presumed loss of control given loss to follow up . Your  a1c is > 9.0 .I recommend tempprary use of NPH insulin at bedtime start with 15 units .   Hyperlipidemia with target LDL less than 70 He has been taking statin therapy with no recent monitoring of liver function.   Lab Results  Component Value Date   ALT 52 03/22/2013   AST 36 03/22/2013   ALKPHOS 48 03/22/2013   BILITOT 0.7 03/22/2013       Updated Medication List Outpatient Encounter Prescriptions as of 05/27/2014  Medication Sig  . ALPRAZolam (XANAX) 0.5 MG tablet Take 1 tablet (0.5 mg total) by mouth at bedtime as needed for sleep or anxiety.  [name] atorvastatin (LIPITOR) 20 MG tablet TAKE ONE TABLET BY MOUTH EVERY DAY  . glipiZIDE (GLUCOTROL) 10 MG tablet TAKE ONE TABLET BY MOUTH TWICE DAILY  Before meals  . glucose blood test strip Test blood sugar twice a day  . lisinopril (PRINIVIL,ZESTRIL) 2.5 MG tablet TAKE ONE TABLET BY MOUTH EVERY DAY  . metFORMIN (GLUCOPHAGE) 1000 MG tablet TAKE ONE TABLET BY MOUTH TWICE DAILY  . naproxen sodium (ANAPROX) 220 MG tablet Take 220 mg by mouth 2 (two) times daily with a meal.  . [DISCONTINUED] ALPRAZolam (XANAX) 0.5 MG tablet Take 1 tablet (0.5 mg total) by mouth at bedtime as needed for sleep or anxiety.  . [DISCONTINUED] glipiZIDE (GLUCOTROL) 10 MG tablet TAKE ONE TABLET BY MOUTH TWICE DAILY **NO  FURTHER  REFILLS  UNTIL  SEEN  BY  MD**  . [DISCONTINUED] metFORMIN (GLUCOPHAGE) 1000 MG tablet TAKE ONE TABLET BY MOUTH TWICE DAILY  . Cyanocobalamin 1000 MCG SUBL Place 1 tablet (1,000 mcg total) under the tongue daily.  . sitaGLIPtin (JANUVIA) 100 MG tablet Take 1 tablet (100 mg total) by mouth daily.  . [DISCONTINUED] ONGLYZA 5 MG TABS tablet TAKE ONE TABLET BY MOUTH EVERY DAY     Orders Placed This Encounter  Procedures  . Tdap vaccine greater than or equal to 38yo IM  . LDL cholesterol, direct  . Comprehensive metabolic  panel  . Hemoglobin A1c  . Microalbumin / creatinine urine ratio  . HM DIABETES FOOT EXAM    No Follow-up on file.

## 2014-05-29 ENCOUNTER — Encounter: Payer: Self-pay | Admitting: Internal Medicine

## 2014-05-29 NOTE — Assessment & Plan Note (Signed)
He has been taking statin therapy with no recent monitoring of liver function.   Lab Results  Component Value Date   ALT 52 03/22/2013   AST 36 03/22/2013   ALKPHOS 48 03/22/2013   BILITOT 0.7 03/22/2013

## 2014-05-29 NOTE — Assessment & Plan Note (Signed)
Managed with statin and control of diabetes,  Liver enzymes are overdue.

## 2014-05-29 NOTE — Assessment & Plan Note (Signed)
Well controlled on current regimen. Renal function is due, but he prefers to have labs done at  [facility].  Lab Results  Component Value Date   CREATININE 0.7 03/22/2013   Lab Results  Component Value Date   NA 134* 03/22/2013   K 4.4 03/22/2013   CL 101 03/22/2013   CO2 23 03/22/2013

## 2014-05-29 NOTE — Assessment & Plan Note (Addendum)
Resuming metformin, glipizide and adding januvia for presumed loss of control given loss to follow up . Your  a1c is > 9.0 .I recommend tempprary use of NPH insulin at bedtime start with 15 units .

## 2014-05-30 LAB — LDL CHOLESTEROL, DIRECT: LDL DIRECT: 78.1 mg/dL

## 2014-05-31 ENCOUNTER — Telehealth: Payer: Self-pay | Admitting: Internal Medicine

## 2014-05-31 NOTE — Telephone Encounter (Signed)
emmi emailed °

## 2014-06-01 ENCOUNTER — Encounter: Payer: Self-pay | Admitting: Internal Medicine

## 2014-06-09 ENCOUNTER — Encounter: Payer: Self-pay | Admitting: Internal Medicine

## 2014-06-13 ENCOUNTER — Telehealth: Payer: Self-pay | Admitting: Internal Medicine

## 2014-06-13 MED ORDER — "INSULIN SYRINGE/NEEDLE 28G X 1/2"" 1 ML MISC": 1.0000 | Freq: Every day | Status: DC

## 2014-06-13 MED ORDER — INSULIN NPH (HUMAN) (ISOPHANE) 100 UNIT/ML ~~LOC~~ SUSP: 15.0000 [IU] | Freq: Every day | SUBCUTANEOUS | Status: DC

## 2014-06-13 NOTE — Telephone Encounter (Signed)
Rx faxed to pharmacy  

## 2014-09-09 LAB — HM DIABETES EYE EXAM

## 2014-09-10 ENCOUNTER — Other Ambulatory Visit: Payer: Self-pay | Admitting: Internal Medicine

## 2014-09-13 ENCOUNTER — Encounter: Payer: Self-pay | Admitting: Internal Medicine

## 2014-09-16 ENCOUNTER — Ambulatory Visit: Payer: No Typology Code available for payment source | Admitting: Internal Medicine

## 2014-11-02 ENCOUNTER — Ambulatory Visit: Payer: No Typology Code available for payment source | Admitting: Internal Medicine

## 2014-11-11 ENCOUNTER — Encounter: Payer: Self-pay | Admitting: Internal Medicine

## 2014-11-11 ENCOUNTER — Ambulatory Visit (INDEPENDENT_AMBULATORY_CARE_PROVIDER_SITE_OTHER): Payer: No Typology Code available for payment source | Admitting: Internal Medicine

## 2014-11-11 VITALS — BP 122/64 | HR 72 | Temp 97.7°F | Resp 12 | Ht 70.0 in | Wt 176.1 lb

## 2014-11-11 DIAGNOSIS — IMO0001 Reserved for inherently not codable concepts without codable children: Secondary | ICD-10-CM

## 2014-11-11 DIAGNOSIS — IMO0002 Reserved for concepts with insufficient information to code with codable children: Secondary | ICD-10-CM

## 2014-11-11 DIAGNOSIS — E1165 Type 2 diabetes mellitus with hyperglycemia: Secondary | ICD-10-CM

## 2014-11-11 LAB — COMPREHENSIVE METABOLIC PANEL
ALT: 74 U/L — ABNORMAL HIGH (ref 0–53)
AST: 30 U/L (ref 0–37)
Albumin: 4.7 g/dL (ref 3.5–5.2)
Alkaline Phosphatase: 55 U/L (ref 39–117)
BILIRUBIN TOTAL: 0.4 mg/dL (ref 0.2–1.2)
BUN: 19 mg/dL (ref 6–23)
CO2: 22 mEq/L (ref 19–32)
Calcium: 9.8 mg/dL (ref 8.4–10.5)
Chloride: 102 mEq/L (ref 96–112)
Creatinine, Ser: 0.67 mg/dL (ref 0.40–1.50)
GFR: 140.34 mL/min (ref >60.00)
GLUCOSE: 163 mg/dL — AB (ref 70–99)
Potassium: 4.4 mEq/L (ref 3.5–5.1)
Sodium: 137 mEq/L (ref 135–145)
Total Protein: 7.4 g/dL (ref 6.0–8.3)

## 2014-11-11 LAB — LDL CHOLESTEROL, DIRECT: Direct LDL: 83 mg/dL

## 2014-11-11 LAB — MICROALBUMIN / CREATININE URINE RATIO
CREATININE, U: 152.4 mg/dL
Microalb Creat Ratio: 8 mg/g (ref 0.0–30.0)
Microalb, Ur: 12.2 mg/dL — ABNORMAL HIGH (ref 0.0–1.9)

## 2014-11-11 LAB — HM DIABETES FOOT EXAM: HM Diabetic Foot Exam: NORMAL

## 2014-11-11 LAB — HEMOGLOBIN A1C: HEMOGLOBIN A1C: 8.3 % — AB (ref 4.6–6.5)

## 2014-11-11 NOTE — Progress Notes (Signed)
Pre visit review using our clinic review tool, if applicable. No additional management support is needed unless otherwise documented below in the visit note. 

## 2014-11-11 NOTE — Progress Notes (Signed)
Patient ID: Andrew Molina, male   DOB: [birth year], 39 y.o.   MRN: [id number] Patient Active Problem List   Diagnosis Date Noted  . Numbness and tingling of foot 03/22/2013  . Tobacco abuse counseling 02/22/2013  . Anxiety state, unspecified 11/23/2012  . Diabetes mellitus type 2, uncontrolled, without complications 09/62/8366  . Gastritis due to nonsteroidal anti-inflammatory drug 05/06/2012  . NAFLD (nonalcoholic fatty liver disease) 09/22/2011  . Hypertension   . Hyperlipidemia with target LDL less than 70     Subjective:  CC:   Chief Complaint  Patient presents with  . Follow-up    F/U with DM    HPI:   Andrew Molina is a 39 y.o. male who presents for Follow up on uncontrolled diabetes, due to noncompliance .  Did not start the NPH 15 units at bextime because his fasting sugars were < 120 for the week after he was given the advice .  However, he has not checked his blood sugars recently . Forgets to take his metformin  About once a week.[name] atkins bar for breakfast 4/5 weekdays.  Taking glipizide and januvia, which is not covered by insurance.    Past Medical History  Diagnosis Date  . Other chronic nonalcoholic liver disease   . Hypertension   . Diabetes mellitus 2005  . Hyperlipidemia   . Cataract     Past Surgical History  Procedure Laterality Date  . Spine surgery      microdiskectomy       The following portions of the patient's history were reviewed and updated as appropriate: Allergies, current medications, and problem list.    Review of Systems:   Patient denies headache, fevers, malaise, unintentional weight loss, skin rash, eye pain, sinus congestion and sinus pain, sore throat, dysphagia,  hemoptysis , cough, dyspnea, wheezing, chest pain, palpitations, orthopnea, edema, abdominal pain, nausea, melena, diarrhea, constipation, flank pain, dysuria, hematuria, urinary  Frequency, nocturia, numbness, tingling, seizures,  Focal weakness, Loss of  consciousness,  Tremor, insomnia, depression, anxiety, and suicidal ideation.     History   Social History  . Marital Status: Married    Spouse Name: N/A  . Number of Children: N/A  . Years of Education: N/A   Occupational History  . Not on file.   Social History Main Topics  . Smoking status: Former Smoker    Quit date: 06/17/2000  . Smokeless tobacco: Former Systems developer    Types: Snuff    Quit date: 01/04/2013  . Alcohol Use: 3.0 oz/week    5 Cans of beer per week  . Drug Use: No  . Sexual Activity: Yes   Other Topics Concern  . Not on file   Social History Narrative    Objective:  Filed Vitals:   11/11/14 1051  BP: 122/64  Pulse: 72  Temp: 97.7 F (36.5 C)  Resp: 12     General appearance: alert, cooperative and appears stated age Ears: normal TM's and external ear canals both ears Throat: lips, mucosa, and tongue normal; teeth and gums normal Neck: no adenopathy, no carotid bruit, supple, symmetrical, trachea midline and thyroid not enlarged, symmetric, no tenderness/mass/nodules Back: symmetric, no curvature. ROM normal. No CVA tenderness. Lungs: clear to auscultation bilaterally Heart: regular rate and rhythm, S1, S2 normal, no murmur, click, rub or gallop Abdomen: soft, non-tender; bowel sounds normal; no masses,  no organomegaly Pulses: 2+ and symmetric Skin: Skin color, texture, turgor normal. No rashes or lesions Lymph nodes: Cervical, supraclavicular,  and axillary nodes normal.  Assessment and Plan:  Diabetes mellitus type 2, uncontrolled, without complications Patient never started the NPH and has been noncompliant with medication regimen.  He has agreed to check sugars twice daily,  And prefers to follow a low GI diet if it will enable him to to avoid using insulin.   Lab Results  Component Value Date   HGBA1C 8.3* 11/11/2014   Lab Results  Component Value Date   MICROALBUR 12.2* 11/11/2014   Lab Results  Component Value Date   CHOL 134  03/22/2013   HDL 36.60* 03/22/2013   LDLCALC 72 03/22/2013   LDLDIRECT 83.0 11/11/2014   TRIG 125.0 03/22/2013   CHOLHDL 4 03/22/2013      A total of 25 minutes of face to face time was spent with patient more than half of which was spent in counselling about the above mentioned conditions  and coordination of care   Updated Medication List Outpatient Encounter Prescriptions as of 11/11/2014  Medication Sig  . ALPRAZolam (XANAX) 0.5 MG tablet Take 1 tablet (0.5 mg total) by mouth at bedtime as needed for sleep or anxiety.  [name] atorvastatin (LIPITOR) 20 MG tablet TAKE ONE TABLET BY MOUTH ONCE DAILY  . Cyanocobalamin 1000 MCG SUBL Place 1 tablet (1,000 mcg total) under the tongue daily.  [name] glipiZIDE (GLUCOTROL) 10 MG tablet TAKE ONE TABLET BY MOUTH TWICE DAILY  Before meals  . lisinopril (PRINIVIL,ZESTRIL) 2.5 MG tablet TAKE ONE TABLET BY MOUTH ONCE DAILY  . metFORMIN (GLUCOPHAGE) 1000 MG tablet TAKE ONE TABLET BY MOUTH TWICE DAILY  . naproxen sodium (ANAPROX) 220 MG tablet Take 220 mg by mouth 2 (two) times daily with a meal.  . sitaGLIPtin (JANUVIA) 100 MG tablet Take 1 tablet (100 mg total) by mouth daily.  [name] glucose blood test strip Test blood sugar twice a day (Patient not taking: Reported on 11/11/2014)  . [DISCONTINUED] INS SYRINGE/NEEDLE 1CC/28G (B-D INSULIN SYRINGE 1CC/28G) 28G X 1/2" 1 ML MISC 1 Syringe by Does not apply route daily after supper. For use with insulin (Patient not taking: Reported on 11/11/2014)  . [DISCONTINUED] insulin NPH Human (NOVOLIN N) 100 UNIT/ML injection Inject 0.15 mLs (15 Units total) into the skin at bedtime. (Patient not taking: Reported on 11/11/2014)     Orders Placed This Encounter  Procedures  . Hemoglobin A1c  . Microalbumin / creatinine urine ratio  . Comprehensive metabolic panel  . LDL cholesterol, direct    Return in about 3 months (around 02/10/2015) for follow up diabetes.

## 2014-11-11 NOTE — Patient Instructions (Signed)
Your post prandial chicken biscuit is 200 using the medications you are currently on.    Breakfast alternative : Premier Protein chocolate shakes   160 cal 30 g protein  And 1 gram of sugar  This is Dr. Lupita Dawn version of a  "Low GI"  Weight loss Diet.  It is appropriate for all patients with normal renal function , gluten tolerance, and advised for patients who have prediabetes or diabetes:   All of the foods can be found at grocery stores and in bulk at [employer].  The Atkins protein bars and shakes are available in more varieties at [employer], [employer]Mart and [place].     7 AM Breakfast:  Choose from the following:  < 5 carbs  Weekdays: Low carbohydrate Protein  Shakes (EAS AdvantEdge "Carb  Control" shakes, Atkins,  Muscle Milk or Premier Protein shakes)     Weekends:  a scrambled egg/bacon/cheese burrito made with Mission's "carb  balance" whole wheat tortilla  (about 10 net carbs )  Eggs,  bacon /sausage , Joseph's pita /lavash bread or  (5 carbs)  A slice of fritatta ( egg based baked dish, no  crust:  google it) (< 10 carbs)   Avoid cereal and bananas, oatmeal and cream of wheat and grits. They are loaded with carbohydrates!   10 AM: high protein snack  (< 5 carbs)   Protein bar by Atkins  Or KIND  (the snack size, < 200 cal, usually < 6 carbs    A stick of cheese:  Around 1 carb,  100 cal      Other so called "protein bars" tend to be loaded with carbohydrates.  Remember, in food advertising, the word "energy" is synonymous for " carbohydrate."  Lunch:   A Sandwich using the bread choices listed, Can use any  Eggs,  lunchmeat, grilled meat or canned tuna).  Can add avocado, regular mayo/mustard  and cheese.  A Salad using blue cheese, ranch,  Goddess dressing  or vinagrette,  No croutons or "confetti" and no "candied nuts" but regular nuts OK.   2 HARD BOILED EGG WHITES AND A CUP OF one of these greek yogurts:    dannon lt n fit greek yogurt         chobani 100 greek yogurt,     Oikos triple zero greek yogurt       No pretzels or chips.  Pickles and miniature sweet peppers are a good low  carb alternative that provide a "crunch"  The bread is the only source of carbohydrate in a sandwich and  can be decreased by trying some of these alternatives to traditional loaf bread:   Joseph's pita bread and Lavash (flat) bread :  50 cal and 4 net carbs  available at BJs and WalMart.  Taste better when toasted, use as pita chips  [employer] makes a variety of  flatbreads and  A PITA POCKET.    LOOK FOR  THE ONES THAT ARE 17 NET CARBS OR LESS    Mission makes 2 sizes of  Low carb whole wheat tortillas  (The large one is  210 cal and 6 net carbs)   Avoid "Low fat dressings, as well as Barry Brunner and [place] dressings    3 PM/ Mid day  Snack:  Consider  1 ounce of  almonds, walnuts, pistachios, pecans, peanuts,  Macadamia nuts or a nut medley that does not contain raisins or cranberries.  No "granola"; the dried cranberries and raisins  are loaded with carbohydrates. Mixed nuts as long as there are no raisins,  cranberries or dried fruit.    Try the prosciutto/mozzarella cheese sticks by [employer]  In deli /backery section   High protein   To avoid overindulging in snacks: Try drinking a glass of unsweeted almond/coconut milk  Or a cup of coffee with your Atkins chocolate bar to keep you from having 3!!!   Pork rinds!  Yes Pork Rinds are low carb potato chip substitute!   Toasted Joseph's flatbread with hummous dip (chickpeas)       6 PM  Dinner:     Meat/fowl/fish with a green salad, and either broccoli, cauliflower, green beans, spinach, brussel sprouts, bok choy or  Lima beans. Fried in canola oil /olive oil BUT DO NOT BREAD THE PROTEIN!!      There is a low carb pasta by Dreamfield's that is acceptable and tastes great: only 5 digestible carbs/serving.( All grocery stores but BJs carry it )  Prepared Meals:  Try Hurley Cisco Angelo's chicken piccata or chicken or eggplant  parm over low carb pasta.(Lowes and BJs)   Marjory Lies Sanchez's "Carnitas" (pulled pork, no sauce,  0 carbs) or his beef pot roast to make a dinner burrito (at [employer])  Barbecue with cole slaw is low carb BUT NO BUN!  SAME WITH HAMBURGERS     Whole wheat pasta is still full of digestible carbs and  Not as low in glycemic index as Dreamfield's.   Brown rice is still rice,  So skip the rice and noodles if you eat [place] or [place] (or at least limit to 1/2 cup)  9 PM snack :   Breyer's "low carb" fudgsicle or  ice cream bar (Carb Smart line), or  Weight  Watcher's ice cream bar , or another "no sugar added" ice cream;  a serving of fresh berries/cherries with whipped cream   Cheese or greek yogurt   8 ounces of [place] unsweetened almond/cococunut milk  Cheese and crackers (using WASA crackers,  They are low carb) or peanut butter on low carb crackers or pita bread     Avoid bananas, pineapple, grapes  and watermelon on a regular basis because they are high in sugar.  THINK OF THEM AS DESSERT and do not have daily   Remember that snack Substitutions should be less than 10 NET carbs per serving and meals should be < 20 net carbs. Remember that carbohydrates from fiber do not affect blood sugar, so you can  subtract fiber grams to get the "net carbs " of any particular food item.

## 2014-11-13 ENCOUNTER — Encounter: Payer: Self-pay | Admitting: Internal Medicine

## 2014-11-13 NOTE — Assessment & Plan Note (Addendum)
Patient never started the NPH and has been noncompliant with medication regimen.  He has agreed to check sugars twice daily,  And prefers to follow a low GI diet if it will enable him to to avoid using insulin.   Lab Results  Component Value Date   HGBA1C 8.3* 11/11/2014   Lab Results  Component Value Date   MICROALBUR 12.2* 11/11/2014   Lab Results  Component Value Date   CHOL 134 03/22/2013   HDL 36.60* 03/22/2013   LDLCALC 72 03/22/2013   LDLDIRECT 83.0 11/11/2014   TRIG 125.0 03/22/2013   CHOLHDL 4 03/22/2013

## 2014-11-18 ENCOUNTER — Telehealth: Payer: Self-pay | Admitting: Internal Medicine

## 2014-11-18 NOTE — Telephone Encounter (Signed)
Ultra touch mini glucose blood test strip 100  I have asked the patient to call his pharmacy and have them to fax that information to our office , he is wanting that prescription filled today . I informed the patient that we have 24 -48 hours to fill a prescription . He stated he sent a my chart message two days ago . I don't see that message in the system.

## 2014-11-21 ENCOUNTER — Other Ambulatory Visit: Payer: Self-pay | Admitting: *Deleted

## 2014-11-21 MED ORDER — GLUCOSE BLOOD VI STRP: ORAL_STRIP | Status: DC

## 2014-11-21 NOTE — Telephone Encounter (Signed)
[facility] sent Rx request on 11/21/2014, Request was approved by Jacqlyn Larsen, RN

## 2014-12-02 ENCOUNTER — Other Ambulatory Visit: Payer: Self-pay | Admitting: *Deleted

## 2014-12-02 ENCOUNTER — Encounter: Payer: Self-pay | Admitting: Internal Medicine

## 2014-12-02 MED ORDER — ATORVASTATIN CALCIUM 20 MG PO TABS: 20.0000 mg | ORAL_TABLET | Freq: Every day | ORAL | Status: DC

## 2014-12-05 NOTE — Telephone Encounter (Signed)
FYI

## 2014-12-15 ENCOUNTER — Other Ambulatory Visit: Payer: Self-pay | Admitting: Internal Medicine

## 2014-12-16 ENCOUNTER — Other Ambulatory Visit: Payer: Self-pay | Admitting: Internal Medicine

## 2014-12-16 NOTE — Telephone Encounter (Signed)
Ok to refill,  printed rx  

## 2014-12-16 NOTE — Telephone Encounter (Signed)
Last OV 11/11/14, Okay to refill?

## 2014-12-16 NOTE — Telephone Encounter (Signed)
Faxed to pharmacy

## 2014-12-24 ENCOUNTER — Other Ambulatory Visit: Payer: Self-pay | Admitting: Internal Medicine

## 2014-12-29 ENCOUNTER — Other Ambulatory Visit: Payer: Self-pay | Admitting: Internal Medicine

## 2015-01-03 ENCOUNTER — Other Ambulatory Visit: Payer: Self-pay | Admitting: *Deleted

## 2015-01-03 ENCOUNTER — Encounter: Payer: Self-pay | Admitting: Internal Medicine

## 2015-01-03 MED ORDER — METFORMIN HCL 1000 MG PO TABS: 1000.0000 mg | ORAL_TABLET | Freq: Two times a day (BID) | ORAL | Status: DC

## 2015-01-03 NOTE — Telephone Encounter (Signed)
Please respond to patient's  inquiry re refills per MyChart message.  I have responded to the other issue

## 2015-01-24 ENCOUNTER — Encounter: Payer: Self-pay | Admitting: Internal Medicine

## 2015-01-26 ENCOUNTER — Other Ambulatory Visit: Payer: Self-pay | Admitting: Internal Medicine

## 2015-01-26 DIAGNOSIS — E1165 Type 2 diabetes mellitus with hyperglycemia: Secondary | ICD-10-CM

## 2015-01-26 DIAGNOSIS — IMO0002 Reserved for concepts with insufficient information to code with codable children: Secondary | ICD-10-CM

## 2015-02-07 ENCOUNTER — Other Ambulatory Visit: Payer: Self-pay | Admitting: Internal Medicine

## 2015-02-12 ENCOUNTER — Other Ambulatory Visit: Payer: Self-pay | Admitting: Internal Medicine

## 2015-02-16 ENCOUNTER — Ambulatory Visit: Payer: No Typology Code available for payment source | Admitting: Internal Medicine

## 2015-02-27 ENCOUNTER — Encounter: Payer: Self-pay | Admitting: Internal Medicine

## 2015-02-27 ENCOUNTER — Ambulatory Visit (INDEPENDENT_AMBULATORY_CARE_PROVIDER_SITE_OTHER): Payer: No Typology Code available for payment source | Admitting: Internal Medicine

## 2015-02-27 VITALS — BP 110/70 | HR 74 | Temp 98.3°F | Resp 14 | Ht 70.0 in | Wt 173.0 lb

## 2015-02-27 DIAGNOSIS — R002 Palpitations: Secondary | ICD-10-CM | POA: Diagnosis not present

## 2015-02-27 DIAGNOSIS — F411 Generalized anxiety disorder: Secondary | ICD-10-CM

## 2015-02-27 DIAGNOSIS — E1165 Type 2 diabetes mellitus with hyperglycemia: Secondary | ICD-10-CM

## 2015-02-27 DIAGNOSIS — Z299 Encounter for prophylactic measures, unspecified: Secondary | ICD-10-CM

## 2015-02-27 DIAGNOSIS — Z23 Encounter for immunization: Secondary | ICD-10-CM

## 2015-02-27 DIAGNOSIS — IMO0001 Reserved for inherently not codable concepts without codable children: Secondary | ICD-10-CM

## 2015-02-27 DIAGNOSIS — R350 Frequency of micturition: Secondary | ICD-10-CM

## 2015-02-27 DIAGNOSIS — Z418 Encounter for other procedures for purposes other than remedying health state: Secondary | ICD-10-CM

## 2015-02-27 DIAGNOSIS — IMO0002 Reserved for concepts with insufficient information to code with codable children: Secondary | ICD-10-CM

## 2015-02-27 DIAGNOSIS — E785 Hyperlipidemia, unspecified: Secondary | ICD-10-CM

## 2015-02-27 DIAGNOSIS — I10 Essential (primary) hypertension: Secondary | ICD-10-CM

## 2015-02-27 LAB — COMPREHENSIVE METABOLIC PANEL
ALBUMIN: 4.8 g/dL (ref 3.5–5.2)
ALT: 46 U/L (ref 0–53)
AST: 27 U/L (ref 0–37)
Alkaline Phosphatase: 57 U/L (ref 39–117)
BUN: 18 mg/dL (ref 6–23)
CO2: 26 meq/L (ref 19–32)
CREATININE: 0.63 mg/dL (ref 0.40–1.50)
Calcium: 10 mg/dL (ref 8.4–10.5)
Chloride: 102 mEq/L (ref 96–112)
GFR: 150.44 mL/min (ref >60.00)
GLUCOSE: 94 mg/dL (ref 70–99)
Potassium: 4.9 mEq/L (ref 3.5–5.1)
SODIUM: 139 meq/L (ref 135–145)
Total Bilirubin: 0.4 mg/dL (ref 0.2–1.2)
Total Protein: 6.7 g/dL (ref 6.0–8.3)

## 2015-02-27 LAB — TSH: TSH: 2.44 u[IU]/mL (ref 0.35–4.50)

## 2015-02-27 LAB — HEMOGLOBIN A1C: Hgb A1c MFr Bld: 6.2 % (ref 4.6–6.5)

## 2015-02-27 LAB — LIPID PANEL
CHOLESTEROL: 139 mg/dL (ref 0–200)
HDL: 40.4 mg/dL (ref >39.00)
LDL Cholesterol: 73 mg/dL (ref 0–99)
NONHDL: 98.6
TRIGLYCERIDES: 126 mg/dL (ref 0.0–149.0)
Total CHOL/HDL Ratio: 3
VLDL: 25.2 mg/dL (ref 0.0–40.0)

## 2015-02-27 NOTE — Patient Instructions (Signed)
You received the Pneumonia vaccine today  If your A1c is < 7.0 today,  I will see you in 6 months;  Otherwise return in 3 months

## 2015-02-27 NOTE — Progress Notes (Signed)
Subjective:  Patient ID: Andrew Molina, male    DOB: 01-12-1976  Age: 39 y.o. MRN: [id number]  CC: The primary encounter diagnosis was Urinary frequency. Diagnoses of Palpitations, Type II diabetes mellitus, uncontrolled, Need for prophylactic measure, Essential hypertension, Anxiety state, Diabetes mellitus type 2, uncontrolled, without complications, and Hyperlipidemia with target LDL less than 70 were also pertinent to this visit.  HPI Andrew Molina presents for follow up on uncontrolled DM anD GAD.  He has been more consistent in taking jis medications and following a low GI  Diet.  Hr has been  Checking sugars sporadically   He has noted that since he resumed exercising he did note an improvement in his blood sugars.    GAD He has  Been using xanax every 3 days  Or so in t the am,. Has anxiety  in the morning with heart racing .  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 ALPRAZolam (XANAX) 0.5 MG tablet TAKE ONE TABLET BY MOUTH AT BEDTIME AS NEEDED FOR SLEEP 30 tablet 5  . atorvastatin (LIPITOR) 20 MG tablet Take 1 tablet (20 mg total) by mouth daily. 90 tablet 1  . glipiZIDE (GLUCOTROL) 10 MG tablet TAKE ONE TABLET BY MOUTH TWICE DAILY BEFORE MEAL(S) 180 tablet 1  . glucose blood test strip Check sugar twice daily. One Touch Ultra strips. Dx: E11.65 200 each 5  . lisinopril (PRINIVIL,ZESTRIL) 2.5 MG tablet TAKE ONE TABLET BY MOUTH ONCE DAILY 90 tablet 1  . metFORMIN (GLUCOPHAGE) 1000 MG tablet TAKE ONE TABLET BY MOUTH TWICE DAILY 180 tablet 1  . metFORMIN (GLUCOPHAGE) 1000 MG tablet TAKE ONE TABLET BY MOUTH TWICE DAILY 60 tablet 0  . naproxen sodium (ANAPROX) 220 MG tablet Take 220 mg by mouth 2 (two) times daily with a meal.    . sitaGLIPtin (JANUVIA) 100 MG tablet Take 1 tablet (100 mg total) by mouth daily. 30 tablet 11  . Cyanocobalamin 1000 MCG SUBL Place 1 tablet (1,000 mcg total)  under the tongue daily. (Patient not taking: Reported on 02/27/2015) 90 tablet 3   No facility-administered medications prior to visit.    Review of Systems;  Patient denies headache, fevers, malaise, unintentional weight loss, skin rash, eye pain, sinus congestion and sinus pain, sore throat, dysphagia,  hemoptysis , cough, dyspnea, wheezing, chest pain, palpitations, orthopnea, edema, abdominal pain, nausea, melena, diarrhea, constipation, flank pain, dysuria, hematuria, urinary  Frequency, nocturia, numbness, tingling, seizures,  Focal weakness, Loss of consciousness,  Tremor, insomnia, depression, anxiety, and suicidal ideation.      Objective:  BP 110/70 mmHg  Pulse 74  Temp(Src) 98.3 F (36.8 C) (Oral)  Resp 14  Ht 5' 10"  (1.778 m)  Wt 173 lb (78.472 kg)  BMI 24.82 kg/m2  SpO2 97%  BP Readings from Last 3 Encounters:  02/27/15 110/70  11/11/14 122/64  05/27/14 118/64    Wt Readings from Last 3 Encounters:  02/27/15 173 lb (78.472 kg)  11/11/14 176 lb 1.9 oz (79.888 kg)  05/27/14 171 lb 8 oz (77.792 kg)    General appearance: alert, cooperative and appears stated age Ears: normal TM's and external ear canals both ears Throat: lips, mucosa, and tongue normal; teeth and gums normal Neck: no adenopathy, no carotid bruit, supple, symmetrical, trachea midline and thyroid not enlarged, symmetric, no tenderness/mass/nodules Back: symmetric, no curvature. ROM normal. No CVA  tenderness. Lungs: clear to auscultation bilaterally Heart: regular rate and rhythm, S1, S2 normal, no murmur, click, rub or gallop Abdomen: soft, non-tender; bowel sounds normal; no masses,  no organomegaly Pulses: 2+ and symmetric Skin: Skin color, texture, turgor normal. No rashes or lesions Lymph nodes: Cervical, supraclavicular, and axillary nodes normal.  Lab Results  Component Value Date   HGBA1C 6.2 02/27/2015   HGBA1C 8.3* 11/11/2014   HGBA1C 9.3* 05/27/2014    Lab Results  Component  Value Date   CREATININE 0.63 02/27/2015   CREATININE 0.67 11/11/2014   CREATININE 0.7 05/27/2014    Lab Results  Component Value Date   WBC 4.2* 06/18/2011   HGB 14.6 06/18/2011   HCT 42.1 06/18/2011   PLT 194.0 06/18/2011   GLUCOSE 94 02/27/2015   CHOL 139 02/27/2015   TRIG 126.0 02/27/2015   HDL 40.40 02/27/2015   LDLDIRECT 83.0 11/11/2014   LDLCALC 73 02/27/2015   ALT 46 02/27/2015   AST 27 02/27/2015   NA 139 02/27/2015   K 4.9 02/27/2015   CL 102 02/27/2015   CREATININE 0.63 02/27/2015   BUN 18 02/27/2015   CO2 26 02/27/2015   TSH 2.44 02/27/2015   HGBA1C 6.2 02/27/2015   MICROALBUR 12.2* 11/11/2014     Assessment & Plan:    . Problem List Items Addressed This Visit      Unprioritized   Hypertension    Well controlled on current regimen. Renal function stable, no changes today.  Lab Results  Component Value Date   CREATININE 0.63 02/27/2015   Lab Results  Component Value Date   NA 139 02/27/2015   K 4.9 02/27/2015   CL 102 02/27/2015   CO2 26 02/27/2015         Hyperlipidemia with target LDL less than 70    LDL and triglycerides are at goal on current medications. He has no side effects and liver enzymes are normal. No changes today   Lab Results  Component Value Date   CHOL 139 02/27/2015   HDL 40.40 02/27/2015   LDLCALC 73 02/27/2015   LDLDIRECT 83.0 11/11/2014   TRIG 126.0 02/27/2015   CHOLHDL 3 02/27/2015     Lab Results  Component Value Date   ALT 46 02/27/2015   AST 27 02/27/2015   ALKPHOS 57 02/27/2015   BILITOT 0.4 02/27/2015           Diabetes mellitus type 2, uncontrolled, without complications    Currently well-controlled on current medications .  hemoglobin A1c is at goal of less than 7.0 . Patient is reminded to schedule an annual eye exam and foot exam is normal today. Patient has  Mild microalbuminuria. Patient is tolerating statin therapy for CAD risk reduction and on ACE/ARB for renal protection and hypertension    Lab Results  Component Value Date   HGBA1C 6.2 02/27/2015   Lab Results  Component Value Date   MICROALBUR 12.2* 11/11/2014   Lab Results  Component Value Date   CHOL 139 02/27/2015   HDL 40.40 02/27/2015   LDLCALC 73 02/27/2015   LDLDIRECT 83.0 11/11/2014   TRIG 126.0 02/27/2015   CHOLHDL 3 02/27/2015             Anxiety state    Managed with prn alprazolam, averaging 3 times per week. The risks and benefits of benzodiazepine use were discussed with patient today including excessive sedation leading to respiratory depression,  impaired thinking/driving, and addiction.  Patient was advised to avoid concurrent use with  alcohol, to use medication only as needed and not to share with others  .        Other Visit Diagnoses    Urinary frequency    -  Primary    Relevant Orders    Urinalysis, Routine w reflex microscopic    Palpitations        Relevant Orders    TSH (Completed)    Type II diabetes mellitus, uncontrolled        Need for prophylactic measure        Relevant Orders    Pneumococcal polysaccharide vaccine 23-valent greater than or equal to 2yo subcutaneous/IM (Completed)       I am having Mr. Murdaugh maintain his naproxen sodium, sitaGLIPtin, metFORMIN, Cyanocobalamin, glucose blood, atorvastatin, ALPRAZolam, lisinopril, glipiZIDE, and metFORMIN.  No orders of the defined types were placed in this encounter.    There are no discontinued medications.  Follow-up: Return in about 3 months (around 05/30/2015) for follow up diabetes.   [name], MD

## 2015-02-27 NOTE — Progress Notes (Signed)
Pre-visit discussion using our clinic review tool. No additional management support is needed unless otherwise documented below in the visit note.  

## 2015-03-01 NOTE — Assessment & Plan Note (Signed)
LDL and triglycerides are at goal on current medications. He has no side effects and liver enzymes are normal. No changes today   Lab Results  Component Value Date   CHOL 139 02/27/2015   HDL 40.40 02/27/2015   LDLCALC 73 02/27/2015   LDLDIRECT 83.0 11/11/2014   TRIG 126.0 02/27/2015   CHOLHDL 3 02/27/2015     Lab Results  Component Value Date   ALT 46 02/27/2015   AST 27 02/27/2015   ALKPHOS 57 02/27/2015   BILITOT 0.4 02/27/2015

## 2015-03-01 NOTE — Assessment & Plan Note (Signed)
Managed with prn alprazolam, averaging 3 times per week. The risks and benefits of benzodiazepine use were discussed with patient today including excessive sedation leading to respiratory depression,  impaired thinking/driving, and addiction.  Patient was advised to avoid concurrent use with alcohol, to use medication only as needed and not to share with others  .

## 2015-03-01 NOTE — Assessment & Plan Note (Signed)
Currently well-controlled on current medications .  hemoglobin A1c is at goal of less than 7.0 . Patient is reminded to schedule an annual eye exam and foot exam is normal today. Patient has  Mild microalbuminuria. Patient is tolerating statin therapy for CAD risk reduction and on ACE/ARB for renal protection and hypertension   Lab Results  Component Value Date   HGBA1C 6.2 02/27/2015   Lab Results  Component Value Date   MICROALBUR 12.2* 11/11/2014   Lab Results  Component Value Date   CHOL 139 02/27/2015   HDL 40.40 02/27/2015   LDLCALC 73 02/27/2015   LDLDIRECT 83.0 11/11/2014   TRIG 126.0 02/27/2015   CHOLHDL 3 02/27/2015

## 2015-03-01 NOTE — Assessment & Plan Note (Signed)
Well controlled on current regimen. Renal function stable, no changes today.  Lab Results  Component Value Date   CREATININE 0.63 02/27/2015   Lab Results  Component Value Date   NA 139 02/27/2015   K 4.9 02/27/2015   CL 102 02/27/2015   CO2 26 02/27/2015

## 2015-03-03 ENCOUNTER — Encounter: Payer: Self-pay | Admitting: Internal Medicine

## 2015-03-17 ENCOUNTER — Other Ambulatory Visit: Payer: Self-pay | Admitting: Internal Medicine

## 2015-03-20 ENCOUNTER — Encounter: Payer: Self-pay | Admitting: Internal Medicine

## 2015-04-19 ENCOUNTER — Other Ambulatory Visit: Payer: Self-pay

## 2015-04-20 ENCOUNTER — Other Ambulatory Visit: Payer: Self-pay | Admitting: Internal Medicine

## 2015-04-20 ENCOUNTER — Telehealth: Payer: Self-pay

## 2015-04-20 NOTE — Telephone Encounter (Signed)
Spoke with a representative at [employer] and Andrew Molina account was deactivated on 04/05/2015.  Called patient and left a message to return my call to see if he has a new insurance card.  Patient called back and will bring new insurance card to the office tomorrow to be scanned.  I will resubmit the PA once Insurance card it brought in.

## 2015-04-20 NOTE — Telephone Encounter (Signed)
PA started for Januvia on Cover my meds.  Will follow up.

## 2015-04-24 ENCOUNTER — Telehealth: Payer: Self-pay

## 2015-04-24 NOTE — Telephone Encounter (Signed)
Resubmitted Prior Authorization for Januvia on Cover my meds for [facility].  Patient brought new insurance card to the office Friday.

## 2015-04-30 ENCOUNTER — Encounter: Payer: Self-pay | Admitting: Internal Medicine

## 2015-05-01 ENCOUNTER — Other Ambulatory Visit: Payer: Self-pay | Admitting: Internal Medicine

## 2015-05-01 MED ORDER — SAXAGLIPTIN HCL 5 MG PO TABS: 5.0000 mg | ORAL_TABLET | Freq: Every day | ORAL | Status: DC

## 2015-05-26 ENCOUNTER — Other Ambulatory Visit: Payer: Self-pay | Admitting: Internal Medicine

## 2015-06-01 ENCOUNTER — Other Ambulatory Visit: Payer: Self-pay

## 2015-06-01 ENCOUNTER — Ambulatory Visit: Payer: No Typology Code available for payment source | Admitting: Internal Medicine

## 2015-06-01 MED ORDER — ATORVASTATIN CALCIUM 20 MG PO TABS: 20.0000 mg | ORAL_TABLET | Freq: Every day | ORAL | Status: DC

## 2015-07-31 ENCOUNTER — Other Ambulatory Visit: Payer: Self-pay | Admitting: Internal Medicine

## 2015-09-04 ENCOUNTER — Ambulatory Visit (INDEPENDENT_AMBULATORY_CARE_PROVIDER_SITE_OTHER): Payer: 59 | Admitting: Internal Medicine

## 2015-09-04 DIAGNOSIS — E1165 Type 2 diabetes mellitus with hyperglycemia: Secondary | ICD-10-CM

## 2015-09-04 NOTE — Progress Notes (Signed)
Patient cancelled his appointment less than 24 hours prior to the office visit and will be charged a no show fee.

## 2015-09-04 NOTE — Assessment & Plan Note (Signed)
Patient failed to keep scheduled appointment and will be charged a no show fee.

## 2015-09-30 ENCOUNTER — Other Ambulatory Visit: Payer: Self-pay | Admitting: Internal Medicine

## 2015-10-11 ENCOUNTER — Other Ambulatory Visit: Payer: Self-pay | Admitting: Internal Medicine

## 2015-10-28 ENCOUNTER — Other Ambulatory Visit: Payer: Self-pay | Admitting: Internal Medicine

## 2015-11-02 ENCOUNTER — Other Ambulatory Visit: Payer: Self-pay | Admitting: Internal Medicine

## 2015-11-03 NOTE — Telephone Encounter (Signed)
Patient seen in 08/2015, but does not have a recent A1C. Please advise?

## 2015-11-03 NOTE — Telephone Encounter (Signed)
Patient no showed jan appt. MED Refilled, PLEASE CALL AND SCHEDULE APPT

## 2015-11-06 NOTE — Telephone Encounter (Signed)
Scheduled 12/04/15 @ 1130 a.m.

## 2015-12-04 ENCOUNTER — Encounter: Payer: Self-pay | Admitting: *Deleted

## 2015-12-04 ENCOUNTER — Ambulatory Visit: Payer: 59 | Admitting: Internal Medicine

## 2015-12-04 DIAGNOSIS — Z0289 Encounter for other administrative examinations: Secondary | ICD-10-CM

## 2015-12-05 ENCOUNTER — Other Ambulatory Visit: Payer: Self-pay | Admitting: Internal Medicine

## 2015-12-12 ENCOUNTER — Ambulatory Visit (INDEPENDENT_AMBULATORY_CARE_PROVIDER_SITE_OTHER): Payer: 59 | Admitting: Internal Medicine

## 2015-12-12 ENCOUNTER — Encounter: Payer: Self-pay | Admitting: Internal Medicine

## 2015-12-12 VITALS — BP 122/72 | HR 77 | Temp 98.2°F | Resp 12 | Ht 70.0 in | Wt 176.8 lb

## 2015-12-12 DIAGNOSIS — E538 Deficiency of other specified B group vitamins: Secondary | ICD-10-CM

## 2015-12-12 DIAGNOSIS — K760 Fatty (change of) liver, not elsewhere classified: Secondary | ICD-10-CM

## 2015-12-12 DIAGNOSIS — E785 Hyperlipidemia, unspecified: Secondary | ICD-10-CM | POA: Diagnosis not present

## 2015-12-12 DIAGNOSIS — E1121 Type 2 diabetes mellitus with diabetic nephropathy: Secondary | ICD-10-CM

## 2015-12-12 DIAGNOSIS — Z716 Tobacco abuse counseling: Secondary | ICD-10-CM | POA: Diagnosis not present

## 2015-12-12 DIAGNOSIS — I10 Essential (primary) hypertension: Secondary | ICD-10-CM

## 2015-12-12 DIAGNOSIS — E1165 Type 2 diabetes mellitus with hyperglycemia: Secondary | ICD-10-CM | POA: Diagnosis not present

## 2015-12-12 DIAGNOSIS — IMO0002 Reserved for concepts with insufficient information to code with codable children: Secondary | ICD-10-CM

## 2015-12-12 LAB — COMPREHENSIVE METABOLIC PANEL
ALBUMIN: 4.7 g/dL (ref 3.5–5.2)
ALT: 43 U/L (ref 0–53)
AST: 22 U/L (ref 0–37)
Alkaline Phosphatase: 52 U/L (ref 39–117)
BILIRUBIN TOTAL: 0.4 mg/dL (ref 0.2–1.2)
BUN: 18 mg/dL (ref 6–23)
CALCIUM: 9.4 mg/dL (ref 8.4–10.5)
CO2: 28 meq/L (ref 19–32)
CREATININE: 0.74 mg/dL (ref 0.40–1.50)
Chloride: 97 mEq/L (ref 96–112)
GFR: 124.44 mL/min (ref >60.00)
Glucose, Bld: 316 mg/dL — ABNORMAL HIGH (ref 70–99)
Potassium: 4.1 mEq/L (ref 3.5–5.1)
Sodium: 136 mEq/L (ref 135–145)
Total Protein: 6.8 g/dL (ref 6.0–8.3)

## 2015-12-12 LAB — MICROALBUMIN / CREATININE URINE RATIO
Creatinine,U: 98.2 mg/dL
MICROALB/CREAT RATIO: 2.1 mg/g (ref 0.0–30.0)
Microalb, Ur: 2.1 mg/dL — ABNORMAL HIGH (ref 0.0–1.9)

## 2015-12-12 LAB — HEMOGLOBIN A1C: HEMOGLOBIN A1C: 6.9 % — AB (ref 4.6–6.5)

## 2015-12-12 LAB — LDL CHOLESTEROL, DIRECT: LDL DIRECT: 90 mg/dL

## 2015-12-12 LAB — LIPID PANEL
CHOL/HDL RATIO: 5
CHOLESTEROL: 178 mg/dL (ref 0–200)
HDL: 39.2 mg/dL (ref >39.00)
Triglycerides: 557 mg/dL — ABNORMAL HIGH (ref 0.0–149.0)

## 2015-12-12 LAB — VITAMIN B12: Vitamin B-12: 332 pg/mL (ref 211–911)

## 2015-12-12 NOTE — Progress Notes (Signed)
Subjective:  Patient ID: Andrew Molina, male    DOB: 1976/07/03  Age: 40 y.o. MRN: [id number]  CC: The primary encounter diagnosis was Tobacco abuse counseling. Diagnoses of NAFLD (nonalcoholic fatty liver disease), Essential hypertension, Hyperlipidemia with target LDL less than 70, B12 deficiency, and Uncontrolled type 2 diabetes mellitus with macroalbuminuric diabetic nephropathy ([place]) were also pertinent to this visit.  HPI Andrew Molina presents for 3 month follow up on Type 2 DM,  Hypertension, hyperlipidemia and obesity.  Last seen in July 2016  Has been inconsistent in managing his diabetes with a low glycemic index diet. Has not been careful, and has not been checking his sugars.  [name]  overdue for eye exam .  Taking medications regularly, no episodes of  low blood sugars ,  No foot issues .  He has reduced glipizide to 5 mg am and 10  Mg in the pm    onglyza is costing  $60/month       Outpatient Prescriptions Prior to Visit  Medication Sig Dispense Refill  . ALPRAZolam (XANAX) 0.5 MG tablet TAKE ONE TABLET BY MOUTH AT BEDTIME AS NEEDED FOR SLEEP 30 tablet 2  . atorvastatin (LIPITOR) 20 MG tablet Take 1 tablet (20 mg total) by mouth daily. 90 tablet 1  . glipiZIDE (GLUCOTROL) 10 MG tablet TAKE ONE TABLET BY MOUTH TWICE DAILY BEFORE MEALS 180 tablet 0  . glucose blood test strip Check sugar twice daily. One Touch Ultra strips. Dx: E11.65 200 each 5  . lisinopril (PRINIVIL,ZESTRIL) 2.5 MG tablet TAKE ONE TABLET BY MOUTH ONCE DAILY 90 tablet 0  . metFORMIN (GLUCOPHAGE) 1000 MG tablet TAKE ONE TABLET BY MOUTH TWICE DAILY 180 tablet 1  . metFORMIN (GLUCOPHAGE) 1000 MG tablet TAKE ONE TABLET BY MOUTH TWICE DAILY 60 tablet 0  . naproxen sodium (ANAPROX) 220 MG tablet Take 220 mg by mouth 2 (two) times daily with a meal.    . ONGLYZA 5 MG TABS tablet TAKE ONE TABLET BY MOUTH ONCE DAILY 30 tablet 5  . Cyanocobalamin 1000 MCG SUBL Place 1 tablet (1,000 mcg total) under the tongue daily. (Patient  not taking: Reported on 02/27/2015) 90 tablet 3   No facility-administered medications prior to visit.    Review of Systems;  Patient denies headache, fevers, malaise, unintentional weight loss, skin rash, eye pain, sinus congestion and sinus pain, sore throat, dysphagia,  hemoptysis , cough, dyspnea, wheezing, chest pain, palpitations, orthopnea, edema, abdominal pain, nausea, melena, diarrhea, constipation, flank pain, dysuria, hematuria, urinary  Frequency, nocturia, numbness, tingling, seizures,  Focal weakness, Loss of consciousness,  Tremor, insomnia, depression, anxiety, and suicidal ideation.      Objective:  BP 122/72 mmHg  Pulse 77  Temp(Src) 98.2 F (36.8 C) (Oral)  Resp 12  Ht 5' 10"  (1.778 m)  Wt 176 lb 12 oz (80.173 kg)  BMI 25.36 kg/m2  SpO2 96%  BP Readings from Last 3 Encounters:  12/12/15 122/72  02/27/15 110/70  11/11/14 122/64    Wt Readings from Last 3 Encounters:  12/12/15 176 lb 12 oz (80.173 kg)  02/27/15 173 lb (78.472 kg)  11/11/14 176 lb 1.9 oz (79.888 kg)    General appearance: alert, cooperative and appears stated age Ears: normal TM's and external ear canals both ears Throat: lips, mucosa, and tongue normal; teeth and gums normal Neck: no adenopathy, no carotid bruit, supple, symmetrical, trachea midline and thyroid not enlarged, symmetric, no tenderness/mass/nodules Back: symmetric, no curvature. ROM normal. No CVA tenderness. Lungs:  clear to auscultation bilaterally Heart: regular rate and rhythm, S1, S2 normal, no murmur, click, rub or gallop Abdomen: soft, non-tender; bowel sounds normal; no masses,  no organomegaly Pulses: 2+ and symmetric Skin: Skin color, texture, turgor normal. No rashes or lesions Lymph nodes: Cervical, supraclavicular, and axillary nodes normal.  Lab Results  Component Value Date   HGBA1C 6.9* 12/12/2015   HGBA1C 6.2 02/27/2015   HGBA1C 8.3* 11/11/2014    Lab Results  Component Value Date   CREATININE 0.74  12/12/2015   CREATININE 0.63 02/27/2015   CREATININE 0.67 11/11/2014    Lab Results  Component Value Date   WBC 4.2* 06/18/2011   HGB 14.6 06/18/2011   HCT 42.1 06/18/2011   PLT 194.0 06/18/2011   GLUCOSE 316* 12/12/2015   CHOL 178 12/12/2015   TRIG * 12/12/2015    557.0 Triglyceride is over 400; calculations on Lipids are invalid.   HDL 39.20 12/12/2015   LDLDIRECT 90.0 12/12/2015   LDLCALC 73 02/27/2015   ALT 43 12/12/2015   AST 22 12/12/2015   NA 136 12/12/2015   K 4.1 12/12/2015   CL 97 12/12/2015   CREATININE 0.74 12/12/2015   BUN 18 12/12/2015   CO2 28 12/12/2015   TSH 2.44 02/27/2015   HGBA1C 6.9* 12/12/2015   MICROALBUR 2.1* 12/12/2015    Dg Abd 2 Views  06/18/2011  *RADIOLOGY REPORT* Clinical Data: 40 year old male with abdominal pain. ABDOMEN - 2 VIEW Comparison: None Findings: The cardiomediastinal silhouette is unremarkable. The lungs are clear. There is no evidence of airspace disease, pleural effusion or pneumothorax. The bowel gas pattern is unremarkable. There is a mild to moderate amount of gas in the stomach. No dilated bowel loops are present. There is no evidence of bowel obstruction or pneumoperitoneum. No suspicious calcifications are identified. IMPRESSION: Unremarkable exam - no evidence of acute abnormality. Original Report Authenticated By: Lura Em, M.D.   Assessment & Plan:   Problem List Items Addressed This Visit    Hyperlipidemia with target LDL less than 70    LDL and triglycerides are at goal on current medications. He has no side effects and liver enzymes are normal. No changes today   Lab Results  Component Value Date   CHOL 178 12/12/2015   HDL 39.20 12/12/2015   LDLCALC 73 02/27/2015   LDLDIRECT 90.0 12/12/2015   TRIG * 12/12/2015    557.0 Triglyceride is over 400; calculations on Lipids are invalid.   CHOLHDL 5 12/12/2015     Lab Results  Component Value Date   ALT 43 12/12/2015   AST 22 12/12/2015   ALKPHOS 52  12/12/2015   BILITOT 0.4 12/12/2015             Relevant Orders   LDL cholesterol, direct (Completed)   Microalbumin / creatinine urine ratio (Completed)   Uncontrolled type 2 diabetes mellitus with macroalbuminuric diabetic nephropathy ([place])    New onset proteinuria discussed with patient today,  Need for adherence stressed.   Patient is reminded to schedule an annual eye exam and foot exam is normal today.. Patient is tolerating statin therapy for CAD risk reduction and on ACE/ARB for renal protection and hypertension   Lab Results  Component Value Date   HGBA1C 6.9* 12/12/2015   Lab Results  Component Value Date   MICROALBUR 2.1* 12/12/2015   Lab Results  Component Value Date   CHOL 178 12/12/2015   HDL 39.20 12/12/2015   LDLCALC 73 02/27/2015   LDLDIRECT 90.0 12/12/2015  TRIG * 12/12/2015    557.0 Triglyceride is over 400; calculations on Lipids are invalid.   CHOLHDL 5 12/12/2015               Relevant Orders   Hemoglobin A1c (Completed)   Lipid panel (Completed)   Tobacco abuse counseling - Primary    For years and has recently changed to Vapor cigarettes.       Hypertension   Relevant Orders   Microalbumin / creatinine urine ratio (Completed)   NAFLD (nonalcoholic fatty liver disease)   Relevant Orders   Comprehensive metabolic panel (Completed)    Other Visit Diagnoses    B12 deficiency        Relevant Orders    Vitamin B12 (Completed)       I am having Mr. Grove maintain his naproxen sodium, metFORMIN, Cyanocobalamin, glucose blood, atorvastatin, lisinopril, glipiZIDE, ALPRAZolam, ONGLYZA, and metFORMIN.  No orders of the defined types were placed in this encounter.    There are no discontinued medications.  Follow-up: No Follow-up on file.   [name], MD

## 2015-12-12 NOTE — Patient Instructions (Signed)
You need to be vaccinated against Hepatitis A & B soon.  Please check with your insurance to see if they cover this.   This is advised because of your fatty liver    To make a low carb chip :  Take the [employer] Lavash or Pita bread,  Or the [place] Low carb whole wheat tortilla   Place on metal cookie sheet  Brush with olive oil  Sprinkle garlic powder (NOT garlic salt), grated parmesan cheese, mediterranean seasoning , or all of them?  Bake at 275 for 30 minutes

## 2015-12-12 NOTE — Progress Notes (Signed)
Pre-visit discussion using our clinic review tool. No additional management support is needed unless otherwise documented below in the visit note.  

## 2015-12-13 ENCOUNTER — Encounter: Payer: Self-pay | Admitting: Internal Medicine

## 2015-12-13 NOTE — Assessment & Plan Note (Signed)
New onset proteinuria discussed with patient today,  Need for adherence stressed.   Patient is reminded to schedule an annual eye exam and foot exam is normal today.. Patient is tolerating statin therapy for CAD risk reduction and on ACE/ARB for renal protection and hypertension   Lab Results  Component Value Date   HGBA1C 6.9* 12/12/2015   Lab Results  Component Value Date   MICROALBUR 2.1* 12/12/2015   Lab Results  Component Value Date   CHOL 178 12/12/2015   HDL 39.20 12/12/2015   LDLCALC 73 02/27/2015   LDLDIRECT 90.0 12/12/2015   TRIG * 12/12/2015    557.0 Triglyceride is over 400; calculations on Lipids are invalid.   CHOLHDL 5 12/12/2015

## 2015-12-13 NOTE — Assessment & Plan Note (Signed)
LDL and triglycerides are at goal on current medications. He has no side effects and liver enzymes are normal. No changes today   Lab Results  Component Value Date   CHOL 178 12/12/2015   HDL 39.20 12/12/2015   LDLCALC 73 02/27/2015   LDLDIRECT 90.0 12/12/2015   TRIG * 12/12/2015    557.0 Triglyceride is over 400; calculations on Lipids are invalid.   CHOLHDL 5 12/12/2015     Lab Results  Component Value Date   ALT 43 12/12/2015   AST 22 12/12/2015   ALKPHOS 52 12/12/2015   BILITOT 0.4 12/12/2015

## 2015-12-13 NOTE — Assessment & Plan Note (Addendum)
For years and has recently changed to Vapor cigarettes.

## 2016-01-05 ENCOUNTER — Other Ambulatory Visit: Payer: Self-pay | Admitting: Internal Medicine

## 2016-01-10 ENCOUNTER — Other Ambulatory Visit: Payer: Self-pay | Admitting: Internal Medicine

## 2016-01-17 ENCOUNTER — Other Ambulatory Visit: Payer: Self-pay | Admitting: Internal Medicine

## 2016-02-08 ENCOUNTER — Other Ambulatory Visit: Payer: Self-pay | Admitting: Internal Medicine

## 2016-02-27 ENCOUNTER — Ambulatory Visit (INDEPENDENT_AMBULATORY_CARE_PROVIDER_SITE_OTHER): Payer: 59 | Admitting: Family Medicine

## 2016-02-27 ENCOUNTER — Other Ambulatory Visit: Payer: Self-pay | Admitting: Family Medicine

## 2016-02-27 ENCOUNTER — Encounter: Payer: Self-pay | Admitting: Family Medicine

## 2016-02-27 VITALS — BP 154/86 | HR 84 | Temp 97.4°F | Wt 175.2 lb

## 2016-02-27 DIAGNOSIS — N200 Calculus of kidney: Secondary | ICD-10-CM | POA: Insufficient documentation

## 2016-02-27 LAB — CBC WITH DIFFERENTIAL/PLATELET
BASOS PCT: 0.4 % (ref 0.0–3.0)
Basophils Absolute: 0 10*3/uL (ref 0.0–0.1)
EOS PCT: 4 % (ref 0.0–5.0)
Eosinophils Absolute: 0.3 10*3/uL (ref 0.0–0.7)
HCT: 39.1 % (ref 39.0–52.0)
Hemoglobin: 13.4 g/dL (ref 13.0–17.0)
LYMPHS ABS: 0.7 10*3/uL (ref 0.7–4.0)
Lymphocytes Relative: 11.3 % — ABNORMAL LOW (ref 12.0–46.0)
MCHC: 34.4 g/dL (ref 30.0–36.0)
MCV: 91 fl (ref 78.0–100.0)
MONO ABS: 0.5 10*3/uL (ref 0.1–1.0)
Monocytes Relative: 7.3 % (ref 3.0–12.0)
NEUTROS PCT: 77 % (ref 43.0–77.0)
Neutro Abs: 5.1 10*3/uL (ref 1.4–7.7)
PLATELETS: 250 10*3/uL (ref 150.0–400.0)
RBC: 4.3 Mil/uL (ref 4.22–5.81)
RDW: 12.8 % (ref 11.5–15.5)
WBC: 6.6 10*3/uL (ref 4.0–10.5)

## 2016-02-27 LAB — COMPREHENSIVE METABOLIC PANEL
ALT: 23 U/L (ref 0–53)
AST: 21 U/L (ref 0–37)
Albumin: 4.5 g/dL (ref 3.5–5.2)
Alkaline Phosphatase: 56 U/L (ref 39–117)
BUN: 27 mg/dL — AB (ref 6–23)
CHLORIDE: 97 meq/L (ref 96–112)
CO2: 31 mEq/L (ref 19–32)
Calcium: 10 mg/dL (ref 8.4–10.5)
Creatinine, Ser: 1.46 mg/dL (ref 0.40–1.50)
GFR: 56.74 mL/min — ABNORMAL LOW (ref >60.00)
GLUCOSE: 207 mg/dL — AB (ref 70–99)
POTASSIUM: 4.9 meq/L (ref 3.5–5.1)
SODIUM: 136 meq/L (ref 135–145)
Total Bilirubin: 0.4 mg/dL (ref 0.2–1.2)
Total Protein: 7.4 g/dL (ref 6.0–8.3)

## 2016-02-27 MED ORDER — OXYCODONE-ACETAMINOPHEN 5-325 MG PO TABS: 1.0000 | ORAL_TABLET | Freq: Three times a day (TID) | ORAL | 0 refills | Status: DC | PRN

## 2016-02-27 MED ORDER — ONDANSETRON HCL 4 MG PO TABS: 4.0000 mg | ORAL_TABLET | Freq: Three times a day (TID) | ORAL | 0 refills | Status: DC | PRN

## 2016-02-27 MED ORDER — CEFTRIAXONE SODIUM 1 G IJ SOLR
1.0000 g | Freq: Once | INTRAMUSCULAR | Status: AC
  Administered 2016-02-27: 1 g via INTRAMUSCULAR

## 2016-02-27 MED ORDER — TAMSULOSIN HCL 0.4 MG PO CAPS: 0.4000 mg | ORAL_CAPSULE | Freq: Every day | ORAL | 0 refills | Status: DC

## 2016-02-27 NOTE — Assessment & Plan Note (Addendum)
New problem. I reviewed the emergency department visit and the labs and imaging. I'm concerned about this patient given his current appearance. CT revealed obstruction and perinephric stranding concerning for underlying infection. IM Rocephin given today. Labs today, when return will plan to start patient on Cipro (tomorrow). Arranging for urgent eval with urology. Rx for Percocet, Flomax, Zofran given today.

## 2016-02-27 NOTE — Progress Notes (Signed)
Pre visit review using our clinic review tool, if applicable. No additional management support is needed unless otherwise documented below in the visit note. 

## 2016-02-27 NOTE — Patient Instructions (Signed)
Take the medication as prescribed.  We will call with your referral.  Take care  Dr. Lacinda Axon

## 2016-02-27 NOTE — Progress Notes (Addendum)
Subjective:  Patient ID: Andrew Molina, male    DOB: 11/13/1975  Age: 40 y.o. MRN: [id number]  CC: Kidney stone  HPI:  40 year old male presents for an acute visit with complaints of kidney stone.  Patient developed severe back pain/flank pain last Wednesday. He was at the beach at the time. He went to a local emergency department in [place] (records in [place] everywhere). He was seen and evaluated. He was found to have a 4-5 mm obstructing kidney stone via CT. It also showed moderate hydronephrosis and perinephric stranding. He was given pain medications and IV fluids and discharged home. He was discharged home on hydrocodone and anti-medics. He was not given any Flomax. He was told to follow-up with his primary care physician if he failed to pass a stone.  Patient presented today with continued complaints of back pain/flank pain. He also reports associated nausea. He's had episodes of sweats. No documented fever. He states that he generally feels poor. He's been taking the medication as prescribed and has had some associated constipation.   Social Hx   Social History   Social History  . Marital status: Married    Spouse name: N/A  . Number of children: N/A  . Years of education: N/A   Social History Main Topics  . Smoking status: Former Smoker    Quit date: 06/17/2000  . Smokeless tobacco: Former Systems developer    Types: Snuff    Quit date: 01/04/2013  . Alcohol use 3.0 oz/week    5 Cans of beer per week  . Drug use: No  . Sexual activity: Yes   Other Topics Concern  . None   Social History Narrative  . None   Review of Systems  Constitutional:       Sweats.   Musculoskeletal: Positive for back pain.    Objective:  BP (!) 154/86 (BP Location: Left Arm, Patient Position: Sitting, Cuff Size: Normal)   Pulse 84   Temp 97.4 F (36.3 C) (Oral)   Wt 175 lb 4 oz (79.5 kg)   SpO2 98%   BMI 25.15 kg/m   BP/Weight 02/27/2016 12/12/2015 4/76/5465  Systolic BP 035 465 681    Diastolic BP 86 72 70  Wt. (Lbs) 175.25 176.75 173  BMI 25.15 25.36 24.82   Physical Exam  Constitutional: He is oriented to person, place, and time. He appears well-developed.  Sweaty and pale.  Cardiovascular: Normal rate and regular rhythm.   Pulmonary/Chest: Effort normal. He has no wheezes. He has no rales.  Abdominal: Soft. He exhibits no distension. There is no tenderness. There is no rebound and no guarding.  No CVA tenderness.   Neurological: He is alert and oriented to person, place, and time.  Psychiatric: He has a normal mood and affect.  Vitals reviewed.  Lab Results  Component Value Date   WBC 6.6 02/27/2016   HGB 13.4 02/27/2016   HCT 39.1 02/27/2016   PLT 250.0 02/27/2016   GLUCOSE 207 (H) 02/27/2016   CHOL 178 12/12/2015   TRIG (H) 12/12/2015    557.0 Triglyceride is over 400; calculations on Lipids are invalid.   HDL 39.20 12/12/2015   LDLDIRECT 90.0 12/12/2015   LDLCALC 73 02/27/2015   ALT 23 02/27/2016   AST 21 02/27/2016   NA 136 02/27/2016   K 4.9 02/27/2016   CL 97 02/27/2016   CREATININE 1.46 02/27/2016   BUN 27 (H) 02/27/2016   CO2 31 02/27/2016   TSH 2.44 02/27/2015  HGBA1C 6.9 (H) 12/12/2015   MICROALBUR 2.1 (H) 12/12/2015   Assessment & Plan:   Problem List Items Addressed This Visit    Nephrolithiasis - Primary    New problem. I reviewed the emergency department visit and the labs and imaging. I'm concerned about this patient given his current appearance. CT revealed obstruction and perinephric stranding concerning for underlying infection. IM Rocephin given today. Labs today, when return will plan to start patient on Cipro (tomorrow). Arranging for urgent eval with urology. Rx for Percocet, Flomax, Zofran given today.       Relevant Medications   oxyCODONE-acetaminophen (ROXICET) 5-325 MG tablet   cefTRIAXone (ROCEPHIN) injection 1 g (Completed)   Other Relevant Orders   Ambulatory referral to Urology   CBC w/Diff (Completed)    Comp Met (CMET) (Completed)    Other Visit Diagnoses   None.     Meds ordered this encounter  Medications  . oxyCODONE-acetaminophen (ROXICET) 5-325 MG tablet    Sig: Take 1 tablet by mouth every 8 (eight) hours as needed for severe pain.    Dispense:  20 tablet    Refill:  0  . tamsulosin (FLOMAX) 0.4 MG CAPS capsule    Sig: Take 1 capsule (0.4 mg total) by mouth daily.    Dispense:  30 capsule    Refill:  0  . ondansetron (ZOFRAN) 4 MG tablet    Sig: Take 1 tablet (4 mg total) by mouth every 8 (eight) hours as needed for nausea or vomiting.    Dispense:  20 tablet    Refill:  0  . cefTRIAXone (ROCEPHIN) injection 1 g    Order Specific Question:   Antibiotic Indication:    Answer:   Other Indication (list below)    Order Specific Question:   Other Indication:    Answer:   kidney stone    Follow-up: PRN  [place]

## 2016-02-29 ENCOUNTER — Ambulatory Visit: Payer: Self-pay | Admitting: Urology

## 2016-02-29 ENCOUNTER — Other Ambulatory Visit: Payer: Self-pay | Admitting: Urology

## 2016-02-29 ENCOUNTER — Encounter (HOSPITAL_COMMUNITY)
Admission: RE | Disposition: A | Discharge status: Home / Self Care | Payer: Self-pay | Source: Ambulatory Visit | Attending: Urology

## 2016-02-29 ENCOUNTER — Ambulatory Visit (HOSPITAL_COMMUNITY): Payer: 59

## 2016-02-29 ENCOUNTER — Encounter (HOSPITAL_COMMUNITY): Payer: Self-pay | Admitting: *Deleted

## 2016-02-29 ENCOUNTER — Ambulatory Visit (HOSPITAL_COMMUNITY)
Admission: RE | Admit: 2016-02-29 | Discharge: 2016-02-29 | Disposition: A | Discharge status: Home / Self Care | Payer: 59 | Source: Ambulatory Visit | Attending: Urology | Admitting: Urology

## 2016-02-29 DIAGNOSIS — Z7984 Long term (current) use of oral hypoglycemic drugs: Secondary | ICD-10-CM | POA: Insufficient documentation

## 2016-02-29 DIAGNOSIS — Z79891 Long term (current) use of opiate analgesic: Secondary | ICD-10-CM | POA: Insufficient documentation

## 2016-02-29 DIAGNOSIS — N201 Calculus of ureter: Secondary | ICD-10-CM | POA: Diagnosis not present

## 2016-02-29 DIAGNOSIS — E119 Type 2 diabetes mellitus without complications: Secondary | ICD-10-CM | POA: Insufficient documentation

## 2016-02-29 DIAGNOSIS — Z791 Long term (current) use of non-steroidal anti-inflammatories (NSAID): Secondary | ICD-10-CM | POA: Diagnosis not present

## 2016-02-29 DIAGNOSIS — I10 Essential (primary) hypertension: Secondary | ICD-10-CM | POA: Insufficient documentation

## 2016-02-29 DIAGNOSIS — Z79899 Other long term (current) drug therapy: Secondary | ICD-10-CM | POA: Diagnosis not present

## 2016-02-29 LAB — GLUCOSE, CAPILLARY
GLUCOSE-CAPILLARY: 88 mg/dL (ref 65–99)
Glucose-Capillary: 85 mg/dL (ref 65–99)
Glucose-Capillary: 216 mg/dL — ABNORMAL HIGH (ref 65–99)

## 2016-02-29 SURGERY — LITHOTRIPSY, ESWL
Anesthesia: LOCAL | Laterality: Left

## 2016-02-29 MED ORDER — DEXTROSE-NACL 5-0.45 % IV SOLN
INTRAVENOUS | Status: DC
  Administered 2016-02-29: 13:00:00 via INTRAVENOUS

## 2016-02-29 MED ORDER — HYDROMORPHONE HCL 1 MG/ML IJ SOLN
1.0000 mg | INTRAMUSCULAR | Status: AC
  Administered 2016-02-29: 1 mg via INTRAVENOUS
  Filled 2016-02-29: qty 1

## 2016-02-29 MED ORDER — DIPHENHYDRAMINE HCL 25 MG PO CAPS
25.0000 mg | ORAL_CAPSULE | ORAL | Status: AC
  Administered 2016-02-29: 25 mg via ORAL
  Filled 2016-02-29: qty 1

## 2016-02-29 MED ORDER — DIAZEPAM 5 MG PO TABS
10.0000 mg | ORAL_TABLET | ORAL | Status: AC
  Administered 2016-02-29: 10 mg via ORAL
  Filled 2016-02-29: qty 2

## 2016-02-29 MED ORDER — SODIUM CHLORIDE 0.9 % IV SOLN
INTRAVENOUS | Status: DC
  Administered 2016-02-29: 14:00:00 via INTRAVENOUS

## 2016-02-29 MED ORDER — CIPROFLOXACIN HCL 500 MG PO TABS
500.0000 mg | ORAL_TABLET | ORAL | Status: AC
  Administered 2016-02-29: 500 mg via ORAL
  Filled 2016-02-29: qty 1

## 2016-02-29 NOTE — Progress Notes (Signed)
Pt scheduled for ESWL today from office. He arrived to Short Stay and CBG was 89( he had taken all his AM Diabetes medications) Spoke with Dr Alyson Ingles and orders received for D5 1/2 at 125 ml/hr. After 500 ml had infused CBG was 216 and IV fluids were changed back to NS at 124m/hr. Pt also took his home medication (Oxycodone 5/325) at 1115 and currertly is having pain of 7/10. Orders received for Dilaudid 137mIV which was given. EWSL currently scheduled for 1600.

## 2016-02-29 NOTE — Discharge Instructions (Signed)
Moderate Conscious Sedation, Adult, Care After Refer to this sheet in the next few weeks. These instructions provide you with information on caring for yourself after your procedure. Your health care provider may also give you more specific instructions. Your treatment has been planned according to current medical practices, but problems sometimes occur. Call your health care provider if you have any problems or questions after your procedure. WHAT TO EXPECT AFTER THE PROCEDURE  After your procedure:  You may feel sleepy, clumsy, and have poor balance for several hours.  Vomiting may occur if you eat too soon after the procedure. HOME CARE INSTRUCTIONS  Do not participate in any activities where you could become injured for at least 24 hours. Do not:  Drive.  Swim.  Ride a bicycle.  Operate heavy machinery.  Cook.  Use power tools.  Climb ladders.  Work from a high place.  Do not make important decisions or sign legal documents until you are improved.  If you vomit, drink water, juice, or soup when you can drink without vomiting. Make sure you have little or no nausea before eating solid foods.  Only take over-the-counter or prescription medicines for pain, discomfort, or fever as directed by your health care provider.  Make sure you and your family fully understand everything about the medicines given to you, including what side effects may occur.  You should not drink alcohol, take sleeping pills, or take medicines that cause drowsiness for at least 24 hours.  If you smoke, do not smoke without supervision.  If you are feeling better, you may resume normal activities 24 hours after you were sedated.  Keep all appointments with your health care provider. SEEK MEDICAL CARE IF:  Your skin is pale or bluish in color.  You continue to feel nauseous or vomit.  Your pain is getting worse and is not helped by medicine.  You have bleeding or swelling.  You are still  sleepy or feeling clumsy after 24 hours. SEEK IMMEDIATE MEDICAL CARE IF:  You develop a rash.  You have difficulty breathing.  You develop any type of allergic problem.  You have a fever. MAKE SURE YOU:  Understand these instructions.  Will watch your condition.  Will get help right away if you are not doing well or get worse.   This information is not intended to replace advice given to you by your health care provider. Make sure you discuss any questions you have with your health care provider.   Document Released: 05/12/2013 Document Revised: 08/12/2014 Document Reviewed: 05/12/2013 Elsevier Interactive Patient Education 2016 [place], Care After Refer to this sheet in the next few weeks. These instructions provide you with information on caring for yourself after your procedure. Your health care provider may also give you more specific instructions. Your treatment has been planned according to current medical practices, but problems sometimes occur. Call your health care provider if you have any problems or questions after your procedure. WHAT TO EXPECT AFTER THE PROCEDURE   Your urine may have a red tinge for a few days after treatment. Blood loss is usually minimal.  You may have soreness in the back or flank area. This usually goes away after a few days. The procedure can cause blotches or bruises on the back where the pressure wave enters the skin. These marks usually cause only minimal discomfort and should disappear in a short time.  Stone fragments should begin to pass within 24 hours of treatment. However, a delayed  passage is not unusual.  You may have pain, discomfort, and feel sick to your stomach (nauseated) when the crushed fragments of stone are passed down the tube from the kidney to the bladder. Stone fragments can pass soon after the procedure and may last for up to 4-8 weeks.  A small number of patients may have severe pain when stone  fragments are not able to pass, which leads to an obstruction.  If your stone is greater than 1 inch (2.5 cm) in diameter or if you have multiple stones that have a combined diameter greater than 1 inch (2.5 cm), you may require more than one treatment.  If you had a stent placed prior to your procedure, you may experience some discomfort, especially during urination. You may experience the pain or discomfort in your flank or back, or you may experience a sharp pain or discomfort at the base of your penis or in your lower abdomen. The discomfort usually lasts only a few minutes after urinating. HOME CARE INSTRUCTIONS   Rest at home until you feel your energy improving.  Only take over-the-counter or prescription medicines for pain, discomfort, or fever as directed by your health care provider. Depending on the type of lithotripsy, you may need to take antibiotics and anti-inflammatory medicines for a few days.  Drink enough water and fluids to keep your urine clear or pale yellow. This helps "flush" your kidneys. It helps pass any remaining pieces of stone and prevents stones from coming back.  Most people can resume daily activities within 1-2 days after standard lithotripsy. It can take longer to recover from laser and percutaneous lithotripsy.  Strain all urine through the provided strainer. Keep all particulate matter and stones for your health care provider to see. The stone may be as small as a grain of salt. It is very important to use the strainer each and every time you pass your urine. Any stones that are found can be sent to a medical lab for examination.  Visit your health care provider for a follow-up appointment in a few weeks. Your doctor may remove your stent if you have one. Your health care provider will also check to see whether stone particles still remain. SEEK MEDICAL CARE IF:   Your pain is not relieved by medicine.  You have a lasting nauseous feeling.  You feel there  is too much blood in the urine.  You develop persistent problems with frequent or painful urination that does not at least partially improve after 2 days following the procedure.  You have a congested cough.  You feel lightheaded.  You develop a rash or any other signs that might suggest an allergic problem.  You develop any reaction or side effects to your medicine(s). SEEK IMMEDIATE MEDICAL CARE IF:   You experience severe back or flank pain or both.  You see nothing but blood when you urinate.  You cannot pass any urine at all.  You have a fever or shaking chills.  You develop shortness of breath, difficulty breathing, or chest pain.  You develop vomiting that will not stop after 6-8 hours.  You have a fainting episode.   This information is not intended to replace advice given to you by your health care provider. Make sure you discuss any questions you have with your health care provider.   Document Released: 08/11/2007 Document Revised: 04/12/2015 Document Reviewed: 02/04/2013 Elsevier Interactive Patient Education [employer].

## 2016-04-01 ENCOUNTER — Ambulatory Visit: Payer: 59 | Admitting: Internal Medicine

## 2016-04-17 ENCOUNTER — Other Ambulatory Visit: Payer: Self-pay | Admitting: Internal Medicine

## 2016-04-20 ENCOUNTER — Other Ambulatory Visit: Payer: Self-pay | Admitting: Internal Medicine

## 2016-04-22 NOTE — Telephone Encounter (Signed)
Refill for 30 days only.  OFFICE VISIT NEEDED prior to any more refills 

## 2016-04-22 NOTE — Telephone Encounter (Signed)
Last follow up OV 5/9//17.... Last refill 10/30/15 #30 with 2 refills... Okay to refill?

## 2016-05-07 ENCOUNTER — Other Ambulatory Visit: Payer: Self-pay | Admitting: Internal Medicine

## 2016-05-08 ENCOUNTER — Encounter: Payer: Self-pay | Admitting: Internal Medicine

## 2016-05-08 ENCOUNTER — Ambulatory Visit (INDEPENDENT_AMBULATORY_CARE_PROVIDER_SITE_OTHER): Payer: 59 | Admitting: Internal Medicine

## 2016-05-08 VITALS — BP 142/90 | HR 99 | Temp 98.0°F | Resp 12 | Ht 70.0 in | Wt 173.2 lb

## 2016-05-08 DIAGNOSIS — E1165 Type 2 diabetes mellitus with hyperglycemia: Secondary | ICD-10-CM

## 2016-05-08 DIAGNOSIS — K760 Fatty (change of) liver, not elsewhere classified: Secondary | ICD-10-CM | POA: Diagnosis not present

## 2016-05-08 DIAGNOSIS — IMO0002 Reserved for concepts with insufficient information to code with codable children: Secondary | ICD-10-CM

## 2016-05-08 DIAGNOSIS — E1121 Type 2 diabetes mellitus with diabetic nephropathy: Secondary | ICD-10-CM

## 2016-05-08 DIAGNOSIS — I10 Essential (primary) hypertension: Secondary | ICD-10-CM | POA: Diagnosis not present

## 2016-05-08 DIAGNOSIS — E785 Hyperlipidemia, unspecified: Secondary | ICD-10-CM | POA: Diagnosis not present

## 2016-05-08 DIAGNOSIS — R911 Solitary pulmonary nodule: Secondary | ICD-10-CM

## 2016-05-08 MED ORDER — LISINOPRIL 5 MG PO TABS: 5.0000 mg | ORAL_TABLET | Freq: Every day | ORAL | 1 refills | Status: DC

## 2016-05-08 NOTE — Progress Notes (Signed)
Subjective:  Patient ID: Andrew Molina, male    DOB: 22-Nov-1975  Age: 40 y.o. MRN: [id number]  CC: The primary encounter diagnosis was Essential hypertension. Diagnoses of Hyperlipidemia with target LDL less than 70, NAFLD (nonalcoholic fatty liver disease), Uncontrolled type 2 diabetes mellitus with macroalbuminuric diabetic nephropathy ([place]), and Solitary pulmonary nodule were also pertinent to this visit.  HPI [name] presents for follow up on type 2 dm.  last seen in May. Has been taking metformin , glipizide  as directed .  Some indiscretions at lunch . Diet complicated by recent history of kidney stones requiring specialized diet .   Foot exam done and normal . Overdue for eye exam /   Had a kidney stone treated at [facility]..  Ended up having [facility]otripsy  [facility] .  Still has one on the left,  No symptoms   RML nodule seen incidentally on CT in July CT . Discussed need for follow up in January due to remote history of tobacco abuse quit at age 35 (smoked since age 89)      Outpatient Medications Prior to Visit  Medication Sig Dispense Refill  . ALPRAZolam (XANAX) 0.5 MG tablet TAKE ONE TABLET BY MOUTH AT BEDTIME AS NEEDED FOR SLEEP 30 tablet 0  . atorvastatin (LIPITOR) 20 MG tablet TAKE ONE TABLET BY MOUTH ONCE DAILY 90 tablet 0  . Docusate Calcium (STOOL SOFTENER PO) Take 1 tablet by mouth as needed.    [name] glipiZIDE (GLUCOTROL) 10 MG tablet TAKE ONE TABLET BY MOUTH TWICE DAILY BEFORE MEAL(S) 180 tablet 3  . glucose blood test strip Check sugar twice daily. One Touch Ultra strips. Dx: E11.65 200 each 5  . metFORMIN (GLUCOPHAGE) 1000 MG tablet TAKE ONE TABLET BY MOUTH TWICE DAILY 180 tablet 1  . naproxen sodium (ANAPROX) 220 MG tablet Take 220 mg by mouth 2 (two) times daily with a meal.    . ONGLYZA 5 MG TABS tablet TAKE ONE TABLET BY MOUTH ONCE DAILY 30 tablet 5  . lisinopril (PRINIVIL,ZESTRIL) 2.5 MG tablet TAKE ONE TABLET BY MOUTH ONCE DAILY 90 tablet 3  .  lisinopril (PRINIVIL,ZESTRIL) 2.5 MG tablet TAKE ONE TABLET BY MOUTH ONCE DAILY 90 tablet 2  . Cyanocobalamin 1000 MCG SUBL Place 1 tablet (1,000 mcg total) under the tongue daily. (Patient not taking: Reported on 05/08/2016) 90 tablet 3  . ibuprofen (ADVIL,MOTRIN) 800 MG tablet Take 800 mg by mouth every 8 (eight) hours as needed for moderate pain.    [name] ondansetron (ZOFRAN) 4 MG tablet Take 1 tablet (4 mg total) by mouth every 8 (eight) hours as needed for nausea or vomiting. (Patient not taking: Reported on 05/08/2016) 20 tablet 0  . oxyCODONE-acetaminophen (ROXICET) 5-325 MG tablet Take 1 tablet by mouth every 8 (eight) hours as needed for severe pain. (Patient not taking: Reported on 05/08/2016) 20 tablet 0  . tamsulosin (FLOMAX) 0.4 MG CAPS capsule Take 1 capsule (0.4 mg total) by mouth daily. (Patient not taking: Reported on 05/08/2016) 30 capsule 0  . metFORMIN (GLUCOPHAGE) 1000 MG tablet TAKE ONE TABLET BY MOUTH TWICE DAILY 180 tablet 3   No facility-administered medications prior to visit.     Review of Systems;  Patient denies headache, fevers, malaise, unintentional weight loss, skin rash, eye pain, sinus congestion and sinus pain, sore throat, dysphagia,  hemoptysis , cough, dyspnea, wheezing, chest pain, palpitations, orthopnea, edema, abdominal pain, nausea, melena, diarrhea, constipation, flank pain, dysuria, hematuria, urinary  Frequency, nocturia, numbness,  tingling, seizures,  Focal weakness, Loss of consciousness,  Tremor, insomnia, depression, anxiety, and suicidal ideation.      Objective:  BP (!) 142/90   Pulse 99   Temp 98 F (36.7 C) (Oral)   Resp 12   Ht 5' 10"  (1.778 m)   Wt 173 lb 4 oz (78.6 kg)   SpO2 97%   BMI 24.86 kg/m   BP Readings from Last 3 Encounters:  05/08/16 (!) 142/90  02/29/16 132/85  02/27/16 (!) 154/86    Wt Readings from Last 3 Encounters:  05/08/16 173 lb 4 oz (78.6 kg)  02/29/16 171 lb 6.4 oz (77.7 kg)  02/27/16 175 lb 4 oz (79.5 kg)     General appearance: alert, cooperative and appears stated age Ears: normal TM's and external ear canals both ears Throat: lips, mucosa, and tongue normal; teeth and gums normal Neck: no adenopathy, no carotid bruit, supple, symmetrical, trachea midline and thyroid not enlarged, symmetric, no tenderness/mass/nodules Back: symmetric, no curvature. ROM normal. No CVA tenderness. Lungs: clear to auscultation bilaterally Heart: regular rate and rhythm, S1, S2 normal, no murmur, click, rub or gallop Abdomen: soft, non-tender; bowel sounds normal; no masses,  no organomegaly Pulses: 2+ and symmetric Skin: Skin color, texture, turgor normal. No rashes or lesions Lymph nodes: Cervical, supraclavicular, and axillary nodes normal.  Lab Results  Component Value Date   HGBA1C 6.2 05/08/2016   HGBA1C 6.9 (H) 12/12/2015   HGBA1C 6.2 02/27/2015    Lab Results  Component Value Date   CREATININE 0.76 05/08/2016   CREATININE 1.46 02/27/2016   CREATININE 0.74 12/12/2015    Lab Results  Component Value Date   WBC 6.6 02/27/2016   HGB 13.4 02/27/2016   HCT 39.1 02/27/2016   PLT 250.0 02/27/2016   GLUCOSE 193 (H) 05/08/2016   CHOL 148 05/08/2016   TRIG 302.0 (H) 05/08/2016   HDL 38.70 (L) 05/08/2016   LDLDIRECT 82.0 05/08/2016   LDLCALC 73 02/27/2015   ALT 32 05/08/2016   AST 21 05/08/2016   NA 138 05/08/2016   K 4.2 05/08/2016   CL 100 05/08/2016   CREATININE 0.76 05/08/2016   BUN 16 05/08/2016   CO2 30 05/08/2016   TSH 2.44 02/27/2015   HGBA1C 6.2 05/08/2016   MICROALBUR 2.1 (H) 12/12/2015    Dg Abd 1 View  Result Date: 02/29/2016 CLINICAL DATA:  Preop for left ureteral stone. EXAM: ABDOMEN - 1 VIEW COMPARISON:  02/29/2016 at 8:55 a.m. FINDINGS: Oval calculus in the left lower pelvis is stable consistent with a distal ureteral calculus. Other small right inferior pelvic densities are most consistent with phleboliths. Possible small left lower pole intrarenal stone. No other  evidence of an intrarenal stone. Soft tissues are otherwise unremarkable. Normal bowel gas pattern. There are disc degenerative changes at L4-L5. IMPRESSION: Stable calculus in the region of the left ureterovesicular junction. Possible small left intrarenal stone. Electronically Signed   By: Lajean Manes M.D.   On: 02/29/2016 12:46   Assessment & Plan:   Problem List Items Addressed This Visit    Hypertension - Primary    Not Well controlled on current regimen. Renal function stable, minimal protieinuria; will increase lisinoprill dose to 5 mg daily.  Lab Results  Component Value Date   CREATININE 0.76 05/08/2016   Lab Results  Component Value Date   NA 138 05/08/2016   K 4.2 05/08/2016   CL 100 05/08/2016   CO2 30 05/08/2016   Lab Results  Component Value Date  MICROALBUR 2.1 (H) 12/12/2015         Relevant Medications   lisinopril (PRINIVIL,ZESTRIL) 5 MG tablet   Hyperlipidemia with target LDL less than 70    LDL is at goal,  triglycerides are elevated. He has no side effects and liver enzymes are normal. No changes today   Lab Results  Component Value Date   CHOL 148 05/08/2016   HDL 38.70 (L) 05/08/2016   LDLCALC 73 02/27/2015   LDLDIRECT 82.0 05/08/2016   TRIG 302.0 (H) 05/08/2016   CHOLHDL 4 05/08/2016     Lab Results  Component Value Date   ALT 32 05/08/2016   AST 21 05/08/2016   ALKPHOS 56 05/08/2016   BILITOT 0.3 05/08/2016             Relevant Medications   lisinopril (PRINIVIL,ZESTRIL) 5 MG tablet   Other Relevant Orders   LDL cholesterol, direct (Completed)   NAFLD (nonalcoholic fatty liver disease)    Managed with statin and control of diabetes,  Liver enzymes are  Normal.  Liver was imaged during recent ct scan for kidney stones  Lab Results  Component Value Date   ALT 32 05/08/2016   AST 21 05/08/2016   ALKPHOS 56 05/08/2016   BILITOT 0.3 05/08/2016        Uncontrolled type 2 diabetes mellitus with macroalbuminuric diabetic  nephropathy ([place])    Currently well-controlled on current medications .  hemoglobin A1c is at goal of less than 7.0 . Patient is reminded to schedule an annual eye exam and foot exam is normal today. Patient has  microalbuminuria. Patient is tolerating statin therapy for CAD risk reduction and on ACE/ARB for renal protection and hypertension   Lab Results  Component Value Date   HGBA1C 6.2 05/08/2016         Relevant Medications   lisinopril (PRINIVIL,ZESTRIL) 5 MG tablet   Other Relevant Orders   Hemoglobin A1c (Completed)   Comprehensive metabolic panel (Completed)   Lipid panel (Completed)   Solitary pulmonary nodule    Incidental finding during ER workup for kidney stones.  Remote history of tobacco abuse.  Repeat in 6 months        Other Visit Diagnoses   None.     I have discontinued Mr. Cothron lisinopril. I have also changed his lisinopril. Additionally, I am having him maintain his naproxen sodium, metFORMIN, Cyanocobalamin, glucose blood, glipiZIDE, oxyCODONE-acetaminophen, tamsulosin, ondansetron, Docusate Calcium (STOOL SOFTENER PO), ibuprofen, atorvastatin, ALPRAZolam, and ONGLYZA.  Meds ordered this encounter  Medications  . lisinopril (PRINIVIL,ZESTRIL) 5 MG tablet    Sig: Take 1 tablet (5 mg total) by mouth daily.    Dispense:  90 tablet    Refill:  1    Medications Discontinued During This Encounter  Medication Reason  . metFORMIN (GLUCOPHAGE) 5852 MG tablet Duplicate  . lisinopril (PRINIVIL,ZESTRIL) 2.5 MG tablet   . lisinopril (PRINIVIL,ZESTRIL) 2.5 MG tablet Reorder    Follow-up: No Follow-up on file.   [name], MD

## 2016-05-08 NOTE — Progress Notes (Signed)
Pre-visit discussion using our clinic review tool. No additional management support is needed unless otherwise documented below in the visit note.  

## 2016-05-08 NOTE — Patient Instructions (Addendum)
I HAVE INCREASED YOUR LISINOPRIL OSE TO 5 MG DAILY TO LOWER YOUR BLOOD PRESSURE  IF YOUR A1C IS >8.0 TODAY,  WE'LL DISCUSS A CHANGE IN MEDICATION  YOU WILL NEED TO HAVE A CHEST CT IN January TO FOLLOW UP ON THE LUNG NODULE THAT WAS SEEN ON THE RIGHT LUNG DURING YOUR ER VISIT

## 2016-05-09 LAB — LDL CHOLESTEROL, DIRECT: LDL DIRECT: 82 mg/dL

## 2016-05-09 LAB — COMPREHENSIVE METABOLIC PANEL
ALBUMIN: 4.5 g/dL (ref 3.5–5.2)
ALK PHOS: 56 U/L (ref 39–117)
ALT: 32 U/L (ref 0–53)
AST: 21 U/L (ref 0–37)
BUN: 16 mg/dL (ref 6–23)
CHLORIDE: 100 meq/L (ref 96–112)
CO2: 30 mEq/L (ref 19–32)
Calcium: 9.4 mg/dL (ref 8.4–10.5)
Creatinine, Ser: 0.76 mg/dL (ref 0.40–1.50)
GFR: 120.42 mL/min (ref >60.00)
Glucose, Bld: 193 mg/dL — ABNORMAL HIGH (ref 70–99)
POTASSIUM: 4.2 meq/L (ref 3.5–5.1)
SODIUM: 138 meq/L (ref 135–145)
TOTAL PROTEIN: 7.2 g/dL (ref 6.0–8.3)
Total Bilirubin: 0.3 mg/dL (ref 0.2–1.2)

## 2016-05-09 LAB — LIPID PANEL
Cholesterol: 148 mg/dL (ref 0–200)
HDL: 38.7 mg/dL — AB (ref >39.00)
NonHDL: 109.71
TRIGLYCERIDES: 302 mg/dL — AB (ref 0.0–149.0)
Total CHOL/HDL Ratio: 4
VLDL: 60.4 mg/dL — AB (ref 0.0–40.0)

## 2016-05-09 LAB — HEMOGLOBIN A1C: HEMOGLOBIN A1C: 6.2 % (ref 4.6–6.5)

## 2016-05-11 DIAGNOSIS — R911 Solitary pulmonary nodule: Secondary | ICD-10-CM | POA: Insufficient documentation

## 2016-05-11 NOTE — Assessment & Plan Note (Addendum)
Not Well controlled on current regimen. Renal function stable, minimal protieinuria; will increase lisinoprill dose to 5 mg daily.  Lab Results  Component Value Date   CREATININE 0.76 05/08/2016   Lab Results  Component Value Date   NA 138 05/08/2016   K 4.2 05/08/2016   CL 100 05/08/2016   CO2 30 05/08/2016   Lab Results  Component Value Date   MICROALBUR 2.1 (H) 12/12/2015

## 2016-05-11 NOTE — Assessment & Plan Note (Signed)
Managed with statin and control of diabetes,  Liver enzymes are  Normal.  Liver was imaged during recent ct scan for kidney stones  Lab Results  Component Value Date   ALT 32 05/08/2016   AST 21 05/08/2016   ALKPHOS 56 05/08/2016   BILITOT 0.3 05/08/2016

## 2016-05-11 NOTE — Assessment & Plan Note (Signed)
Incidental finding during ER workup for kidney stones.  Remote history of tobacco abuse.  Repeat in 6 months

## 2016-05-11 NOTE — Assessment & Plan Note (Signed)
LDL is at goal,  triglycerides are elevated. He has no side effects and liver enzymes are normal. No changes today   Lab Results  Component Value Date   CHOL 148 05/08/2016   HDL 38.70 (L) 05/08/2016   LDLCALC 73 02/27/2015   LDLDIRECT 82.0 05/08/2016   TRIG 302.0 (H) 05/08/2016   CHOLHDL 4 05/08/2016     Lab Results  Component Value Date   ALT 32 05/08/2016   AST 21 05/08/2016   ALKPHOS 56 05/08/2016   BILITOT 0.3 05/08/2016

## 2016-05-11 NOTE — Assessment & Plan Note (Signed)
Currently well-controlled on current medications .  hemoglobin A1c is at goal of less than 7.0 . Patient is reminded to schedule an annual eye exam and foot exam is normal today. Patient has  microalbuminuria. Patient is tolerating statin therapy for CAD risk reduction and on ACE/ARB for renal protection and hypertension   Lab Results  Component Value Date   HGBA1C 6.2 05/08/2016

## 2016-06-17 ENCOUNTER — Other Ambulatory Visit: Payer: Self-pay | Admitting: Internal Medicine

## 2016-06-17 NOTE — Telephone Encounter (Signed)
Last filled 04/22/16. Last OV 05/08/16.

## 2016-06-19 NOTE — Telephone Encounter (Signed)
Rx faxed

## 2016-08-14 ENCOUNTER — Ambulatory Visit (INDEPENDENT_AMBULATORY_CARE_PROVIDER_SITE_OTHER): Payer: Commercial Managed Care - HMO | Admitting: Internal Medicine

## 2016-08-14 ENCOUNTER — Encounter: Payer: Self-pay | Admitting: Internal Medicine

## 2016-08-14 VITALS — BP 116/66 | HR 88 | Temp 98.2°F | Resp 16 | Ht 70.0 in | Wt 175.5 lb

## 2016-08-14 DIAGNOSIS — R002 Palpitations: Secondary | ICD-10-CM | POA: Diagnosis not present

## 2016-08-14 DIAGNOSIS — E1121 Type 2 diabetes mellitus with diabetic nephropathy: Secondary | ICD-10-CM | POA: Diagnosis not present

## 2016-08-14 DIAGNOSIS — Z87891 Personal history of nicotine dependence: Secondary | ICD-10-CM

## 2016-08-14 DIAGNOSIS — IMO0002 Reserved for concepts with insufficient information to code with codable children: Secondary | ICD-10-CM

## 2016-08-14 DIAGNOSIS — E1165 Type 2 diabetes mellitus with hyperglycemia: Secondary | ICD-10-CM | POA: Diagnosis not present

## 2016-08-14 DIAGNOSIS — K760 Fatty (change of) liver, not elsewhere classified: Secondary | ICD-10-CM | POA: Diagnosis not present

## 2016-08-14 DIAGNOSIS — F411 Generalized anxiety disorder: Secondary | ICD-10-CM

## 2016-08-14 LAB — COMPREHENSIVE METABOLIC PANEL
ALT: 45 U/L (ref 0–53)
AST: 24 U/L (ref 0–37)
Albumin: 4.5 g/dL (ref 3.5–5.2)
Alkaline Phosphatase: 49 U/L (ref 39–117)
BILIRUBIN TOTAL: 0.4 mg/dL (ref 0.2–1.2)
BUN: 20 mg/dL (ref 6–23)
CO2: 23 meq/L (ref 19–32)
CREATININE: 0.73 mg/dL (ref 0.40–1.50)
Calcium: 9.5 mg/dL (ref 8.4–10.5)
Chloride: 103 mEq/L (ref 96–112)
GFR: 125.98 mL/min (ref >60.00)
GLUCOSE: 110 mg/dL — AB (ref 70–99)
Potassium: 4.3 mEq/L (ref 3.5–5.1)
Sodium: 136 mEq/L (ref 135–145)
Total Protein: 7 g/dL (ref 6.0–8.3)

## 2016-08-14 LAB — TSH: TSH: 1.92 u[IU]/mL (ref 0.35–4.50)

## 2016-08-14 LAB — HEMOGLOBIN A1C: HEMOGLOBIN A1C: 7.3 % — AB (ref 4.6–6.5)

## 2016-08-14 LAB — LDL CHOLESTEROL, DIRECT: LDL DIRECT: 95 mg/dL

## 2016-08-14 MED ORDER — ATORVASTATIN CALCIUM 20 MG PO TABS: 20.0000 mg | ORAL_TABLET | Freq: Every day | ORAL | 1 refills | Status: DC

## 2016-08-14 MED ORDER — VENLAFAXINE HCL 37.5 MG PO TABS: 37.5000 mg | ORAL_TABLET | Freq: Two times a day (BID) | ORAL | 0 refills | Status: DC

## 2016-08-14 MED ORDER — ALPRAZOLAM 0.5 MG PO TABS: 0.5000 mg | ORAL_TABLET | Freq: Every evening | ORAL | 5 refills | Status: DC | PRN

## 2016-08-14 NOTE — Progress Notes (Signed)
Subjective:  Patient ID: Andrew Molina, male    DOB: [birth year]  Age: 41 y.o. MRN: [id number]  CC: The primary encounter diagnosis was Palpitations. Diagnoses of NAFLD (nonalcoholic fatty liver disease), Uncontrolled type 2 diabetes mellitus with macroalbuminuric diabetic nephropathy ([place]), Generalized anxiety disorder, and History of tobacco abuse were also pertinent to this visit.  HPI CALTON HARSHFIELD presents for follow up on type 2 DM,  Hypertension and tobacco abuse.    Patient has no complaints today.  Patient is following a low glycemic index diet and taking all prescribed medications regularly without side effects.  Fasting sugars have been under less than 150 most of the time and post prandials have been under 160 except on rare occasions. Patient is exercising about 2 times per week and not  intentionally trying to lose weight .  Patient has not had an eye exam in the last 12 months and checks feet regularly for signs of infection.  Patient does not walk barefoot outside,  And denies an numbness tingling or burning in feet. Patient is up to date on all recommended vaccinations.  Having trouble affording the onglyza .   Lab Results  Component Value Date   HGBA1C 7.3 (H) 08/14/2016   Lab Results  Component Value Date   MICROALBUR 2.1 (H) 12/12/2015   Using 1/2 glipizide in the am,  No lows as long as he remembers to have a mid morning snack.    Eye APPT TOMORROW  [facility]   Taking alprazolam for recurrent panic attacks.  Occurring on a daily basis but only using it prn  . Heart seems to pound , feels that overall his level of anxiety is getting worse. No prior trial of SNRI or SSRI   Lab Results  Component Value Date   TSH 1.92 08/14/2016     Outpatient Medications Prior to Visit  Medication Sig Dispense Refill  . glipiZIDE (GLUCOTROL) 10 MG tablet TAKE ONE TABLET BY MOUTH TWICE DAILY BEFORE MEAL(S) 180 tablet 3  . glucose blood test strip Check sugar twice daily. One  Touch Ultra strips. Dx: E11.65 200 each 5  . lisinopril (PRINIVIL,ZESTRIL) 5 MG tablet Take 1 tablet (5 mg total) by mouth daily. 90 tablet 1  . metFORMIN (GLUCOPHAGE) 1000 MG tablet TAKE ONE TABLET BY MOUTH TWICE DAILY 180 tablet 1  . naproxen sodium (ANAPROX) 220 MG tablet Take 220 mg by mouth 2 (two) times daily with a meal.    . ONGLYZA 5 MG TABS tablet TAKE ONE TABLET BY MOUTH ONCE DAILY 30 tablet 5  . ALPRAZolam (XANAX) 0.5 MG tablet Take 1 tablet (0.5 mg total) by mouth at bedtime as needed for sleep. 30 tablet 2  . atorvastatin (LIPITOR) 20 MG tablet TAKE ONE TABLET BY MOUTH ONCE DAILY 90 tablet 0  . Cyanocobalamin 1000 MCG SUBL Place 1 tablet (1,000 mcg total) under the tongue daily. (Patient not taking: Reported on 05/08/2016) 90 tablet 3  . Docusate Calcium (STOOL SOFTENER PO) Take 1 tablet by mouth as needed.    [name] ibuprofen (ADVIL,MOTRIN) 800 MG tablet Take 800 mg by mouth every 8 (eight) hours as needed for moderate pain.    [name] ondansetron (ZOFRAN) 4 MG tablet Take 1 tablet (4 mg total) by mouth every 8 (eight) hours as needed for nausea or vomiting. (Patient not taking: Reported on 05/08/2016) 20 tablet 0  . oxyCODONE-acetaminophen (ROXICET) 5-325 MG tablet Take 1 tablet by mouth every 8 (eight) hours as needed for  severe pain. (Patient not taking: Reported on 05/08/2016) 20 tablet 0  . tamsulosin (FLOMAX) 0.4 MG CAPS capsule Take 1 capsule (0.4 mg total) by mouth daily. (Patient not taking: Reported on 05/08/2016) 30 capsule 0   No facility-administered medications prior to visit.     Review of Systems;  Patient denies headache, fevers, malaise, unintentional weight loss, skin rash, eye pain, sinus congestion and sinus pain, sore throat, dysphagia,  hemoptysis , cough, dyspnea, wheezing, chest pain, palpitations, orthopnea, edema, abdominal pain, nausea, melena, diarrhea, constipation, flank pain, dysuria, hematuria, urinary  Frequency, nocturia, numbness, tingling, seizures,  Focal  weakness, Loss of consciousness,  Tremor, insomnia, depression,  and suicidal ideation.      Objective:  BP 116/66   Pulse 88   Temp 98.2 F (36.8 C) (Oral)   Resp 16   Ht 5' 10"  (1.778 m)   Wt 175 lb 8 oz (79.6 kg)   SpO2 97%   BMI 25.18 kg/m   BP Readings from Last 3 Encounters:  08/14/16 116/66  05/08/16 (!) 142/90  02/29/16 132/85    Wt Readings from Last 3 Encounters:  08/14/16 175 lb 8 oz (79.6 kg)  05/08/16 173 lb 4 oz (78.6 kg)  02/29/16 171 lb 6.4 oz (77.7 kg)    General appearance: alert, cooperative and appears stated age Ears: normal TM's and external ear canals both ears Throat: lips, mucosa, and tongue normal; teeth and gums normal Neck: no adenopathy, no carotid bruit, supple, symmetrical, trachea midline and thyroid not enlarged, symmetric, no tenderness/mass/nodules Back: symmetric, no curvature. ROM normal. No CVA tenderness. Lungs: clear to auscultation bilaterally Heart: regular rate and rhythm, S1, S2 normal, no murmur, click, rub or gallop Abdomen: soft, non-tender; bowel sounds normal; no masses,  no organomegaly Pulses: 2+ and symmetric Skin: Skin color, texture, turgor normal. No rashes or lesions Lymph nodes: Cervical, supraclavicular, and axillary nodes normal.  Lab Results  Component Value Date   HGBA1C 7.3 (H) 08/14/2016   HGBA1C 6.2 05/08/2016   HGBA1C 6.9 (H) 12/12/2015    Lab Results  Component Value Date   CREATININE 0.73 08/14/2016   CREATININE 0.76 05/08/2016   CREATININE 1.46 02/27/2016    Lab Results  Component Value Date   WBC 6.6 02/27/2016   HGB 13.4 02/27/2016   HCT 39.1 02/27/2016   PLT 250.0 02/27/2016   GLUCOSE 110 (H) 08/14/2016   CHOL 148 05/08/2016   TRIG 302.0 (H) 05/08/2016   HDL 38.70 (L) 05/08/2016   LDLDIRECT 95.0 08/14/2016   LDLCALC 73 02/27/2015   ALT 45 08/14/2016   AST 24 08/14/2016   NA 136 08/14/2016   K 4.3 08/14/2016   CL 103 08/14/2016   CREATININE 0.73 08/14/2016   BUN 20 08/14/2016     CO2 23 08/14/2016   TSH 1.92 08/14/2016   HGBA1C 7.3 (H) 08/14/2016   MICROALBUR 2.1 (H) 12/12/2015      Assessment & Plan:   Problem List Items Addressed This Visit    Generalized anxiety disorder    With recurrent panic attacks occurring more frequently.  Adding Effexor.       History of tobacco abuse    He continues to report abstinence       NAFLD (nonalcoholic fatty liver disease)    Managed with statin, low glycemic index  and control of diabetes,  Liver enzymes remain  Normal.  Liver was imaged during recent ct scan for kidney stones  Lab Results  Component Value Date   ALT 45  08/14/2016   AST 24 08/14/2016   ALKPHOS 49 08/14/2016   BILITOT 0.4 08/14/2016        Relevant Orders   LDL cholesterol, direct (Completed)   Comprehensive metabolic panel (Completed)   Uncontrolled type 2 diabetes mellitus with macroalbuminuric diabetic nephropathy ([place])    Improving control om  current medications .  hemoglobin A1c is 7.3 . Patient  Can try suspending the onglyza for cost savings and increase the glipizide to 5 mg bid. is reminded to schedule an annual eye exam and foot exam is normal today. Patient has  microalbuminuria. Patient is tolerating statin therapy for CAD risk reduction and on ACE/ARB for renal protection and hypertension   Lab Results  Component Value Date   HGBA1C 7.3 (H) 08/14/2016         Relevant Medications   atorvastatin (LIPITOR) 20 MG tablet   Other Relevant Orders   Hemoglobin A1c (Completed)    Other Visit Diagnoses    Palpitations    -  Primary   Relevant Orders   TSH (Completed)     A total of 25 minutes of face to face time was spent with patient more than half of which was spent in counselling about the above mentioned conditions  and coordination of care  I have discontinued Mr. Turnbo Cyanocobalamin, oxyCODONE-acetaminophen, tamsulosin, ondansetron, Docusate Calcium (STOOL SOFTENER PO), and ibuprofen. I have also changed his  atorvastatin. Additionally, I am having him start on venlafaxine. Lastly, I am having him maintain his naproxen sodium, metFORMIN, glucose blood, glipiZIDE, ONGLYZA, lisinopril, and ALPRAZolam.  Meds ordered this encounter  Medications  . atorvastatin (LIPITOR) 20 MG tablet    Sig: Take 1 tablet (20 mg total) by mouth daily.    Dispense:  90 tablet    Refill:  1  . venlafaxine (EFFEXOR) 37.5 MG tablet    Sig: Take 1 tablet (37.5 mg total) by mouth 2 (two) times daily.    Dispense:  60 tablet    Refill:  0  . ALPRAZolam (XANAX) 0.5 MG tablet    Sig: Take 1 tablet (0.5 mg total) by mouth at bedtime as needed for sleep.    Dispense:  30 tablet    Refill:  5    Medications Discontinued During This Encounter  Medication Reason  . oxyCODONE-acetaminophen (ROXICET) 5-325 MG tablet No longer needed (for PRN medications)  . tamsulosin (FLOMAX) 0.4 MG CAPS capsule No longer needed (for PRN medications)  . Cyanocobalamin 1000 MCG SUBL Patient Discharge  . ondansetron (ZOFRAN) 4 MG tablet No longer needed (for PRN medications)  . Docusate Calcium (STOOL SOFTENER PO) No longer needed (for PRN medications)  . ibuprofen (ADVIL,MOTRIN) 800 MG tablet No longer needed (for PRN medications)  . atorvastatin (LIPITOR) 20 MG tablet Reorder  . ALPRAZolam (XANAX) 0.5 MG tablet Reorder    Follow-up: Return in about 3 months (around 11/12/2016) for follow up diabetes.   [name], MD

## 2016-08-14 NOTE — Patient Instructions (Addendum)
Soft drinks: If you're trying  to get off them:  Try drinking a Kevita  (carbnonated probiotic beverage )  In the refrigerated fruits section of all grocery store  section    If your A1c is 6.5 or lower,  You can try suspending the Onglyza and increasing the glipizide to 10 mg twice daily to save your $$$$  If your post prandials are > 160 on this regimen, let me know   Trial of generic Effexor for anxiety,  Take it am and PM.  It does com ein a once daily formula and the dose can be increased  Venlafaxine tablets What is this medicine? VENLAFAXINE (VEN la fax een) is used to treat depression, anxiety and panic disorder. This medicine may be used for other purposes; ask your health care provider or pharmacist if you have questions. COMMON BRAND NAME(S): Effexor What should I tell my health care provider before I take this medicine? They need to know if you have any of these conditions: -bleeding disorders -glaucoma -heart disease -high blood pressure -high cholesterol -kidney disease -liver disease -low levels of sodium in the blood -mania or bipolar disorder -seizures -suicidal thoughts, plans, or attempt; a previous suicide attempt by you or a family -take medicines that treat or prevent blood clots -thyroid disease -an unusual or allergic reaction to venlafaxine, desvenlafaxine, other medicines, foods, dyes, or preservatives -pregnant or trying to get pregnant -breast-feeding How should I use this medicine? Take this medicine by mouth with a glass of water. Follow the directions on the prescription label. Take it with food. Take your medicine at regular intervals. Do not take your medicine more often than directed. Do not stop taking this medicine suddenly except upon the advice of your doctor. Stopping this medicine too quickly may cause serious side effects or your condition may worsen. A special MedGuide will be given to you by the pharmacist with each prescription and  refill. Be sure to read this information carefully each time. Talk to your pediatrician regarding the use of this medicine in children. Special care may be needed. Overdosage: If you think you have taken too much of this medicine contact a poison control center or emergency room at once. NOTE: This medicine is only for you. Do not share this medicine with others. What if I miss a dose? If you miss a dose, take it as soon as you can. If it is almost time for your next dose, take only that dose. Do not take double or extra doses. What may interact with this medicine? Do not take this medicine with any of the following medications: -certain medicines for fungal infections like fluconazole, itraconazole, ketoconazole, posaconazole, voriconazole -cisapride -desvenlafaxine -dofetilide -dronedarone -duloxetine -levomilnacipran -linezolid -MAOIs like Carbex, Eldepryl, Marplan, Nardil, and Parnate -methylene blue (injected into a vein) -milnacipran -pimozide -thioridazine -ziprasidone This medicine may also interact with the following medications: -amphetamines -aspirin and aspirin-like medicines -certain medicines for depression, anxiety, or psychotic disturbances -certain medicines for migraine headaches like almotriptan, eletriptan, frovatriptan, naratriptan, rizatriptan, sumatriptan, zolmitriptan -certain medicines for sleep -certain medicines that treat or prevent blood clots like dalteparin, enoxaparin, warfarin -cimetidine -clozapine -diuretics -fentanyl -furazolidone -indinavir -isoniazid -lithium -metoprolol -NSAIDS, medicines for pain and inflammation, like ibuprofen or naproxen -other medicines that prolong the QT interval (cause an abnormal heart rhythm) -procarbazine -rasagiline -supplements like St. John's wort, kava kava, valerian -tramadol -tryptophan This list may not describe all possible interactions. Give your health care provider a list of all the medicines,  herbs,  non-prescription drugs, or dietary supplements you use. Also tell them if you smoke, drink alcohol, or use illegal drugs. Some items may interact with your medicine. What should I watch for while using this medicine? Tell your doctor if your symptoms do not get better or if they get worse. Visit your doctor or health care professional for regular checks on your progress. Because it may take several weeks to see the full effects of this medicine, it is important to continue your treatment as prescribed by your doctor. Patients and their families should watch out for new or worsening thoughts of suicide or depression. Also watch out for sudden changes in feelings such as feeling anxious, agitated, panicky, irritable, hostile, aggressive, impulsive, severely restless, overly excited and hyperactive, or not being able to sleep. If this happens, especially at the beginning of treatment or after a change in dose, call your health care professional. This medicine can cause an increase in blood pressure. Check with your doctor for instructions on monitoring your blood pressure while taking this medicine. You may get drowsy or dizzy. Do not drive, use machinery, or do anything that needs mental alertness until you know how this medicine affects you. Do not stand or sit up quickly, especially if you are an older patient. This reduces the risk of dizzy or fainting spells. Alcohol may interfere with the effect of this medicine. Avoid alcoholic drinks. Your mouth may get dry. Chewing sugarless gum, sucking hard candy and drinking plenty of water will help. Contact your doctor if the problem does not go away or is severe. What side effects may I notice from receiving this medicine? Side effects that you should report to your doctor or health care professional as soon as possible: -allergic reactions like skin rash, itching or hives, swelling of the face, lips, or tongue -anxious -breathing  problems -confusion -changes in vision -chest pain -confusion -elevated mood, decreased need for sleep, racing thoughts, impulsive behavior -eye pain -fast, irregular heartbeat -feeling faint or lightheaded, falls -feeling agitated, angry, or irritable -hallucination, loss of contact with reality -high blood pressure -loss of balance or coordination -palpitations -redness, blistering, peeling or loosening of the skin, including inside the mouth -restlessness, pacing, inability to keep still -seizures -stiff muscles -suicidal thoughts or other mood changes -trouble passing urine or change in the amount of urine -trouble sleeping -unusual bleeding or bruising -unusually weak or tired -vomiting Side effects that usually do not require medical attention (report to your doctor or health care professional if they continue or are bothersome): -change in sex drive or performance -change in appetite or weight -constipation -dizziness -dry mouth -headache -increased sweating -nausea -tired This list may not describe all possible side effects. Call your doctor for medical advice about side effects. You may report side effects to FDA at 1-800-FDA-1088. Where should I keep my medicine? Keep out of the reach of children. Store at a controlled temperature between 20 and 25 degrees C (68 and 77 degrees F), in a dry place. Throw away any unused medicine after the expiration date. NOTE: This sheet is a summary. It may not cover all possible information. If you have questions about this medicine, talk to your doctor, pharmacist, or health care provider.  2017 Elsevier/Gold Standard (2015-12-21 18:42:26)

## 2016-08-15 DIAGNOSIS — E119 Type 2 diabetes mellitus without complications: Secondary | ICD-10-CM | POA: Diagnosis not present

## 2016-08-15 LAB — HM DIABETES EYE EXAM

## 2016-08-16 ENCOUNTER — Encounter: Payer: Self-pay | Admitting: Internal Medicine

## 2016-08-16 DIAGNOSIS — Z79899 Other long term (current) drug therapy: Secondary | ICD-10-CM | POA: Diagnosis not present

## 2016-08-17 ENCOUNTER — Encounter: Payer: Self-pay | Admitting: Internal Medicine

## 2016-08-17 NOTE — Assessment & Plan Note (Signed)
Improving control om  current medications .  hemoglobin A1c is 7.3 . Patient  Can try suspending the onglyza for cost savings and increase the glipizide to 5 mg bid. is reminded to schedule an annual eye exam and foot exam is normal today. Patient has  microalbuminuria. Patient is tolerating statin therapy for CAD risk reduction and on ACE/ARB for renal protection and hypertension   Lab Results  Component Value Date   HGBA1C 7.3 (H) 08/14/2016

## 2016-08-17 NOTE — Assessment & Plan Note (Addendum)
Managed with statin, low glycemic index  and control of diabetes,  Liver enzymes remain  Normal.  Liver was imaged during recent ct scan for kidney stones  Lab Results  Component Value Date   ALT 45 08/14/2016   AST 24 08/14/2016   ALKPHOS 49 08/14/2016   BILITOT 0.4 08/14/2016

## 2016-08-17 NOTE — Assessment & Plan Note (Signed)
With recurrent panic attacks occurring more frequently.  Adding Effexor.

## 2016-08-17 NOTE — Assessment & Plan Note (Signed)
He continues to report abstinence

## 2016-08-18 ENCOUNTER — Encounter: Payer: Self-pay | Admitting: Internal Medicine

## 2016-08-20 ENCOUNTER — Encounter: Payer: Self-pay | Admitting: Internal Medicine

## 2016-11-13 ENCOUNTER — Ambulatory Visit: Payer: Commercial Managed Care - HMO | Admitting: Internal Medicine

## 2017-01-12 ENCOUNTER — Other Ambulatory Visit: Payer: Self-pay | Admitting: Internal Medicine

## 2017-03-10 ENCOUNTER — Other Ambulatory Visit: Payer: Self-pay | Admitting: Internal Medicine

## 2017-03-28 ENCOUNTER — Other Ambulatory Visit: Payer: Self-pay | Admitting: Internal Medicine

## 2017-04-09 ENCOUNTER — Other Ambulatory Visit: Payer: Self-pay | Admitting: Internal Medicine

## 2017-04-15 ENCOUNTER — Other Ambulatory Visit: Payer: Self-pay | Admitting: Internal Medicine

## 2017-04-21 ENCOUNTER — Ambulatory Visit (INDEPENDENT_AMBULATORY_CARE_PROVIDER_SITE_OTHER): Payer: 59 | Admitting: Internal Medicine

## 2017-04-21 ENCOUNTER — Encounter: Payer: Self-pay | Admitting: Internal Medicine

## 2017-04-21 VITALS — BP 116/74 | HR 60 | Temp 98.3°F | Resp 15 | Ht 70.0 in | Wt 176.6 lb

## 2017-04-21 DIAGNOSIS — E1165 Type 2 diabetes mellitus with hyperglycemia: Secondary | ICD-10-CM | POA: Diagnosis not present

## 2017-04-21 DIAGNOSIS — IMO0002 Reserved for concepts with insufficient information to code with codable children: Secondary | ICD-10-CM

## 2017-04-21 DIAGNOSIS — E1121 Type 2 diabetes mellitus with diabetic nephropathy: Secondary | ICD-10-CM | POA: Diagnosis not present

## 2017-04-21 DIAGNOSIS — F411 Generalized anxiety disorder: Secondary | ICD-10-CM

## 2017-04-21 DIAGNOSIS — E785 Hyperlipidemia, unspecified: Secondary | ICD-10-CM

## 2017-04-21 DIAGNOSIS — I10 Essential (primary) hypertension: Secondary | ICD-10-CM | POA: Diagnosis not present

## 2017-04-21 LAB — LIPID PANEL
Cholesterol: 180 mg/dL (ref 0–200)
HDL: 43.4 mg/dL (ref >39.00)
LDL CALC: 99 mg/dL (ref 0–99)
NONHDL: 136.13
Total CHOL/HDL Ratio: 4
Triglycerides: 187 mg/dL — ABNORMAL HIGH (ref 0.0–149.0)
VLDL: 37.4 mg/dL (ref 0.0–40.0)

## 2017-04-21 LAB — COMPREHENSIVE METABOLIC PANEL
ALT: 40 U/L (ref 0–53)
AST: 18 U/L (ref 0–37)
Albumin: 4.4 g/dL (ref 3.5–5.2)
Alkaline Phosphatase: 48 U/L (ref 39–117)
BUN: 15 mg/dL (ref 6–23)
CO2: 28 mEq/L (ref 19–32)
Calcium: 9.7 mg/dL (ref 8.4–10.5)
Chloride: 100 mEq/L (ref 96–112)
Creatinine, Ser: 0.7 mg/dL (ref 0.40–1.50)
GFR: 131.79 mL/min (ref >60.00)
GLUCOSE: 231 mg/dL — AB (ref 70–99)
Potassium: 4.4 mEq/L (ref 3.5–5.1)
Sodium: 136 mEq/L (ref 135–145)
TOTAL PROTEIN: 6.8 g/dL (ref 6.0–8.3)
Total Bilirubin: 0.5 mg/dL (ref 0.2–1.2)

## 2017-04-21 LAB — MICROALBUMIN / CREATININE URINE RATIO
Creatinine,U: 160.4 mg/dL
MICROALB UR: 5.7 mg/dL — AB (ref 0.0–1.9)
Microalb Creat Ratio: 3.6 mg/g (ref 0.0–30.0)

## 2017-04-21 LAB — HEMOGLOBIN A1C: HEMOGLOBIN A1C: 7.9 % — AB (ref 4.6–6.5)

## 2017-04-21 MED ORDER — GLUCOSE BLOOD VI STRP: ORAL_STRIP | 5 refills | Status: DC

## 2017-04-21 NOTE — Patient Instructions (Signed)
Continue glipizide and metformin for now  Additional meds to be added once I see your blood sugars and your A1c  Check once daily ,  At variable times:  Fasting, pre lunch,  Post lunch and post dinner (2 hours)

## 2017-04-21 NOTE — Assessment & Plan Note (Signed)
Well controlled on current regimen. Renal function due.,   no changes today.

## 2017-04-21 NOTE — Assessment & Plan Note (Signed)
Secondary to dietary noncompliance and medication noncompliance due to cost.  Warned patient that additional medications including the possibility of insulin may be needed pending review of labs drawn today

## 2017-04-21 NOTE — Assessment & Plan Note (Signed)
managed with atorvastatin. Lipids and lfts due  Lab Results  Component Value Date   CHOL 148 05/08/2016   HDL 38.70 (L) 05/08/2016   LDLCALC 73 02/27/2015   LDLDIRECT 95.0 08/14/2016   TRIG 302.0 (H) 05/08/2016   CHOLHDL 4 05/08/2016   Lab Results  Component Value Date   ALT 45 08/14/2016   AST 24 08/14/2016   ALKPHOS 49 08/14/2016   BILITOT 0.4 08/14/2016

## 2017-04-21 NOTE — Assessment & Plan Note (Signed)
Managed with rare use of xamax. Did not toelrate effexor.

## 2017-04-21 NOTE — Progress Notes (Signed)
Subjective:  Patient ID: Andrew Molina, male    DOB: [birth year]  Age: 41 y.o. MRN: [id number]  CC: The primary encounter diagnosis was Uncontrolled type 2 diabetes mellitus with macroalbuminuric diabetic nephropathy ([place]). Diagnoses of Generalized anxiety disorder, Essential hypertension, and Hyperlipidemia with target LDL less than 70 were also pertinent to this visit.  HPI Andrew Molina presents for follow up on diabetes.  Patient has no complaints today.  Patient is not following a low glycemic index diet  On a regular basis and not taking all prescribed medications regularly without side effects.  Stopped the Onglyza several months ago due to cost,  Still taking glipizide and metformin.  Has not checked BS in "a while"  Fasting sugars had been averaging 180  most of the time and post prandials not checked.  Patient is exercising about 3 times per week and not  intentionally trying to lose weight .  Patient has had an eye exam in the last 12 months and checks feet regularly for signs of infection.  Patient does not walk barefoot outside,  And denies any numbness tingling or burning in feet. Patient is up to date on all recommended vaccinations  Has stopped the onglyza due to the copay was $50 or $60  Per month.    Did not tolerate effexor due to increased anger.  Stopped after one week. Since he eliminated diet sodas, his anxiety has improved. Not evening using xanax daily  Lab Results  Component Value Date   MICROALBUR 2.1 (H) 12/12/2015     Lab Results  Component Value Date   HGBA1C 7.3 (H) 08/14/2016    Outpatient Medications Prior to Visit  Medication Sig Dispense Refill  . ALPRAZolam (XANAX) 0.5 MG tablet TAKE ONE TABLET BY MOUTH AT BEDTIME AS NEEDED FOR SLEEP 30 tablet 5  . atorvastatin (LIPITOR) 20 MG tablet Take 1 tablet (20 mg total) by mouth daily. 90 tablet 1  . glipiZIDE (GLUCOTROL) 10 MG tablet TAKE ONE TABLET BY MOUTH TWICE DAILY BEFORE  MEALS. 180 tablet 3  . lisinopril  (PRINIVIL,ZESTRIL) 5 MG tablet TAKE ONE TABLET BY MOUTH ONCE DAILY 90 tablet 1  . metFORMIN (GLUCOPHAGE) 1000 MG tablet TAKE ONE TABLET BY MOUTH TWICE DAILY 180 tablet 1  . naproxen sodium (ANAPROX) 220 MG tablet Take 220 mg by mouth 2 (two) times daily with a meal.    . glucose blood test strip Check sugar twice daily. One Touch Ultra strips. Dx: E11.65 200 each 5  . metFORMIN (GLUCOPHAGE) 1000 MG tablet TAKE 1 TABLET BY MOUTH TWICE DAILY (Patient not taking: Reported on 04/21/2017) 180 tablet 0  . venlafaxine (EFFEXOR) 37.5 MG tablet Take 1 tablet (37.5 mg total) by mouth 2 (two) times daily. (Patient not taking: Reported on 04/21/2017) 60 tablet 0   No facility-administered medications prior to visit.     Review of Systems;  Patient denies headache, fevers, malaise, unintentional weight loss, skin rash, eye pain, sinus congestion and sinus pain, sore throat, dysphagia,  hemoptysis , cough, dyspnea, wheezing, chest pain, palpitations, orthopnea, edema, abdominal pain, nausea, melena, diarrhea, constipation, flank pain, dysuria, hematuria, urinary  Frequency, nocturia, numbness, tingling, seizures,  Focal weakness, Loss of consciousness,  Tremor, insomnia, depression, anxiety, and suicidal ideation.      Objective:  BP 116/74 (BP Location: Left Arm, Patient Position: Sitting, Cuff Size: Normal)   Pulse 60   Temp 98.3 F (36.8 C) (Oral)   Resp 15   Ht 5' 10"  (1.778 m)  Wt 176 lb 9.6 oz (80.1 kg)   SpO2 99%   BMI 25.34 kg/m   BP Readings from Last 3 Encounters:  04/21/17 116/74  08/14/16 116/66  05/08/16 (!) 142/90    Wt Readings from Last 3 Encounters:  04/21/17 176 lb 9.6 oz (80.1 kg)  08/14/16 175 lb 8 oz (79.6 kg)  05/08/16 173 lb 4 oz (78.6 kg)    General appearance: alert, cooperative and appears stated age Ears: normal TM's and external ear canals both ears Throat: lips, mucosa, and tongue normal; teeth and gums normal Neck: no adenopathy, no carotid bruit, supple,  symmetrical, trachea midline and thyroid not enlarged, symmetric, no tenderness/mass/nodules Back: symmetric, no curvature. ROM normal. No CVA tenderness. Lungs: clear to auscultation bilaterally Heart: regular rate and rhythm, S1, S2 normal, no murmur, click, rub or gallop Abdomen: soft, non-tender; bowel sounds normal; no masses,  no organomegaly Pulses: 2+ and symmetric Skin: Skin color, texture, turgor normal. No rashes or lesions Lymph nodes: Cervical, supraclavicular, and axillary nodes normal.  Lab Results  Component Value Date   HGBA1C 7.3 (H) 08/14/2016   HGBA1C 6.2 05/08/2016   HGBA1C 6.9 (H) 12/12/2015    Lab Results  Component Value Date   CREATININE 0.73 08/14/2016   CREATININE 0.76 05/08/2016   CREATININE 1.46 02/27/2016    Lab Results  Component Value Date   WBC 6.6 02/27/2016   HGB 13.4 02/27/2016   HCT 39.1 02/27/2016   PLT 250.0 02/27/2016   GLUCOSE 110 (H) 08/14/2016   CHOL 148 05/08/2016   TRIG 302.0 (H) 05/08/2016   HDL 38.70 (L) 05/08/2016   LDLDIRECT 95.0 08/14/2016   LDLCALC 73 02/27/2015   ALT 45 08/14/2016   AST 24 08/14/2016   NA 136 08/14/2016   K 4.3 08/14/2016   CL 103 08/14/2016   CREATININE 0.73 08/14/2016   BUN 20 08/14/2016   CO2 23 08/14/2016   TSH 1.92 08/14/2016   HGBA1C 7.3 (H) 08/14/2016   MICROALBUR 2.1 (H) 12/12/2015    Dg Abd 1 View  Result Date: 02/29/2016 CLINICAL DATA:  Preop for left ureteral stone. EXAM: ABDOMEN - 1 VIEW COMPARISON:  02/29/2016 at 8:55 a.m. FINDINGS: Oval calculus in the left lower pelvis is stable consistent with a distal ureteral calculus. Other small right inferior pelvic densities are most consistent with phleboliths. Possible small left lower pole intrarenal stone. No other evidence of an intrarenal stone. Soft tissues are otherwise unremarkable. Normal bowel gas pattern. There are disc degenerative changes at L4-L5. IMPRESSION: Stable calculus in the region of the left ureterovesicular junction.  Possible small left intrarenal stone. Electronically Signed   By: Lajean Manes M.D.   On: 02/29/2016 12:46   Assessment & Plan:   Problem List Items Addressed This Visit    Generalized anxiety disorder    Managed with rare use of xamax. Did not toelrate effexor.       Hyperlipidemia with target LDL less than 70    managed with atorvastatin. Lipids and lfts due  Lab Results  Component Value Date   CHOL 148 05/08/2016   HDL 38.70 (L) 05/08/2016   LDLCALC 73 02/27/2015   LDLDIRECT 95.0 08/14/2016   TRIG 302.0 (H) 05/08/2016   CHOLHDL 4 05/08/2016   Lab Results  Component Value Date   ALT 45 08/14/2016   AST 24 08/14/2016   ALKPHOS 49 08/14/2016   BILITOT 0.4 08/14/2016         Hypertension    Well controlled on current regimen.  Renal function due.,   no changes today.      Uncontrolled type 2 diabetes mellitus with macroalbuminuric diabetic nephropathy ([place]) - Primary    Secondary to dietary noncompliance and medication noncompliance due to cost.  Warned patient that additional medications including the possibility of insulin may be needed pending review of labs drawn today      Relevant Orders   Hemoglobin A1c   Comprehensive metabolic panel   Lipid panel   Microalbumin / creatinine urine ratio    A total of 25 minutes of face to face time was spent with patient more than half of which was spent in counselling about the above mentioned conditions  and coordination of care   I have discontinued Mr. Hippert venlafaxine. I am also having him maintain his naproxen sodium, metFORMIN, atorvastatin, lisinopril, glipiZIDE, ALPRAZolam, and glucose blood.  Meds ordered this encounter  Medications  . glucose blood test strip    Sig: Check sugar twice daily. One Touch Ultra strips. Dx: E11.65    Dispense:  200 each    Refill:  5    Uncontrolled type 2 diabetes mellitus    Medications Discontinued During This Encounter  Medication Reason  . metFORMIN (GLUCOPHAGE) 9702 MG  tablet Duplicate  . venlafaxine (EFFEXOR) 37.5 MG tablet Patient has not taken in last 30 days  . glucose blood test strip Reorder    Follow-up: Return in about 3 months (around 07/21/2017).   [name], MD

## 2017-04-23 ENCOUNTER — Encounter: Payer: Self-pay | Admitting: Internal Medicine

## 2017-05-09 ENCOUNTER — Encounter: Payer: Self-pay | Admitting: Internal Medicine

## 2017-05-14 MED ORDER — EMPAGLIFLOZIN 25 MG PO TABS: 25.0000 mg | ORAL_TABLET | Freq: Every day | ORAL | 5 refills | Status: DC

## 2017-05-14 NOTE — Telephone Encounter (Signed)
Please start PA for Jardiance per patient e mail and let him know.  thanks

## 2017-05-21 ENCOUNTER — Other Ambulatory Visit: Payer: Self-pay | Admitting: Internal Medicine

## 2017-05-21 ENCOUNTER — Telehealth: Payer: Self-pay

## 2017-05-21 NOTE — Telephone Encounter (Signed)
PA submitted for Jardiance and approved thru 05/21/2018.  Key: [id number]

## 2017-07-14 ENCOUNTER — Other Ambulatory Visit: Payer: Self-pay | Admitting: Internal Medicine

## 2017-07-21 ENCOUNTER — Ambulatory Visit: Payer: 59 | Admitting: Internal Medicine

## 2017-07-21 ENCOUNTER — Encounter: Payer: Self-pay | Admitting: Internal Medicine

## 2017-07-21 VITALS — BP 128/70 | HR 81 | Temp 98.0°F | Resp 15 | Ht 70.0 in | Wt 172.2 lb

## 2017-07-21 DIAGNOSIS — K760 Fatty (change of) liver, not elsewhere classified: Secondary | ICD-10-CM

## 2017-07-21 DIAGNOSIS — E1121 Type 2 diabetes mellitus with diabetic nephropathy: Secondary | ICD-10-CM

## 2017-07-21 DIAGNOSIS — E785 Hyperlipidemia, unspecified: Secondary | ICD-10-CM

## 2017-07-21 DIAGNOSIS — IMO0002 Reserved for concepts with insufficient information to code with codable children: Secondary | ICD-10-CM

## 2017-07-21 DIAGNOSIS — E1165 Type 2 diabetes mellitus with hyperglycemia: Secondary | ICD-10-CM

## 2017-07-21 LAB — COMPREHENSIVE METABOLIC PANEL
ALK PHOS: 49 U/L (ref 39–117)
ALT: 39 U/L (ref 0–53)
AST: 22 U/L (ref 0–37)
Albumin: 4.6 g/dL (ref 3.5–5.2)
BUN: 25 mg/dL — AB (ref 6–23)
CO2: 27 mEq/L (ref 19–32)
Calcium: 9.5 mg/dL (ref 8.4–10.5)
Chloride: 102 mEq/L (ref 96–112)
Creatinine, Ser: 0.75 mg/dL (ref 0.40–1.50)
GFR: 121.55 mL/min (ref >60.00)
GLUCOSE: 70 mg/dL (ref 70–99)
POTASSIUM: 4.3 meq/L (ref 3.5–5.1)
SODIUM: 137 meq/L (ref 135–145)
TOTAL PROTEIN: 7.1 g/dL (ref 6.0–8.3)
Total Bilirubin: 0.4 mg/dL (ref 0.2–1.2)

## 2017-07-21 LAB — HEMOGLOBIN A1C: HEMOGLOBIN A1C: 6.2 % (ref 4.6–6.5)

## 2017-07-21 NOTE — Progress Notes (Signed)
Subjective:  Patient ID: Andrew Molina, male    DOB: 06/08/1976  Age: 41 y.o. MRN: [id number]  CC: The primary encounter diagnosis was NAFLD (nonalcoholic fatty liver disease). Diagnoses of Uncontrolled type 2 diabetes mellitus with macroalbuminuric diabetic nephropathy (HCC) and Hyperlipidemia with target LDL less than 70 were also pertinent to this visit.  HPI Andrew Molina presents for 3 month follow up on diabetes.  Patient has no complaints today.  Patient is following a low glycemic index diet and taking all prescribed medications regularly without side effects.  Fasting sugars have been under less than 140 most of the time and post prandials have been under 160 except on rare occasions. Patient is not exercising or intentionally trying to lose weight .  Patient has had an eye exam in the last 12 months and checks feet regularly for signs of infection.  Patient does not walk barefoot outside,  And denies an numbness tingling or burning in feet. Patient is up to date on all recommended vaccinations.  tolerating jardiance except for some mild  Queasiness,  and stools were loose   improved after 10 days.  He has declined the flu vaccine  Not interested   Lab Results  Component Value Date   MICROALBUR 5.7 (H) 04/21/2017       Lab Results  Component Value Date   HGBA1C 6.2 07/21/2017     Outpatient Medications Prior to Visit  Medication Sig Dispense Refill  . ALPRAZolam (XANAX) 0.5 MG tablet TAKE ONE TABLET BY MOUTH AT BEDTIME AS NEEDED FOR SLEEP 30 tablet 5  . atorvastatin (LIPITOR) 20 MG tablet TAKE ONE TABLET BY MOUTH ONCE DAILY 90 tablet 1  . empagliflozin (JARDIANCE) 25 MG TABS tablet Take 25 mg by mouth daily. 30 tablet 5  . glipiZIDE (GLUCOTROL) 10 MG tablet TAKE ONE TABLET BY MOUTH TWICE DAILY BEFORE  MEALS. 180 tablet 3  . glucose blood test strip Check sugar twice daily. One Touch Ultra strips. Dx: E11.65 200 each 5  . lisinopril (PRINIVIL,ZESTRIL) 5 MG tablet TAKE ONE TABLET  BY MOUTH ONCE DAILY 90 tablet 1  . metFORMIN (GLUCOPHAGE) 1000 MG tablet TAKE ONE TABLET BY MOUTH TWICE DAILY 180 tablet 1  . naproxen sodium (ANAPROX) 220 MG tablet Take 220 mg by mouth 2 (two) times daily with a meal.    . metFORMIN (GLUCOPHAGE) 1000 MG tablet TAKE 1 TABLET BY MOUTH TWICE DAILY (Patient not taking: Reported on 07/21/2017) 180 tablet 0   No facility-administered medications prior to visit.     Review of Systems;  Patient denies headache, fevers, malaise, unintentional weight loss, skin rash, eye pain, sinus congestion and sinus pain, sore throat, dysphagia,  hemoptysis , cough, dyspnea, wheezing, chest pain, palpitations, orthopnea, edema, abdominal pain, nausea, melena, diarrhea, constipation, flank pain, dysuria, hematuria, urinary  Frequency, nocturia, numbness, tingling, seizures,  Focal weakness, Loss of consciousness,  Tremor, insomnia, depression, anxiety, and suicidal ideation.      Objective:  BP 128/70 (BP Location: Left Arm, Patient Position: Sitting, Cuff Size: Normal)   Pulse 81   Temp 98 F (36.7 C) (Oral)   Resp 15   Ht 5' 10"  (1.778 m)   Wt 172 lb 3.2 oz (78.1 kg)   SpO2 97%   BMI 24.71 kg/m   BP Readings from Last 3 Encounters:  07/21/17 128/70  04/21/17 116/74  08/14/16 116/66    Wt Readings from Last 3 Encounters:  07/21/17 172 lb 3.2 oz (78.1 kg)  04/21/17 176  lb 9.6 oz (80.1 kg)  08/14/16 175 lb 8 oz (79.6 kg)    General appearance: alert, cooperative and appears stated age Ears: normal TM's and external ear canals both ears Throat: lips, mucosa, and tongue normal; teeth and gums normal Neck: no adenopathy, no carotid bruit, supple, symmetrical, trachea midline and thyroid not enlarged, symmetric, no tenderness/mass/nodules Back: symmetric, no curvature. ROM normal. No CVA tenderness. Lungs: clear to auscultation bilaterally Heart: regular rate and rhythm, S1, S2 normal, no murmur, click, rub or gallop Abdomen: soft, non-tender;  bowel sounds normal; no masses,  no organomegaly Pulses: 2+ and symmetric Skin: Skin color, texture, turgor normal. No rashes or lesions Lymph nodes: Cervical, supraclavicular, and axillary nodes normal.  Lab Results  Component Value Date   HGBA1C 6.2 07/21/2017   HGBA1C 7.9 (H) 04/21/2017   HGBA1C 7.3 (H) 08/14/2016    Lab Results  Component Value Date   CREATININE 0.75 07/21/2017   CREATININE 0.70 04/21/2017   CREATININE 0.73 08/14/2016    Lab Results  Component Value Date   WBC 6.6 02/27/2016   HGB 13.4 02/27/2016   HCT 39.1 02/27/2016   PLT 250.0 02/27/2016   GLUCOSE 70 07/21/2017   CHOL 180 04/21/2017   TRIG 187.0 (H) 04/21/2017   HDL 43.40 04/21/2017   LDLDIRECT 95.0 08/14/2016   LDLCALC 99 04/21/2017   ALT 39 07/21/2017   AST 22 07/21/2017   NA 137 07/21/2017   K 4.3 07/21/2017   CL 102 07/21/2017   CREATININE 0.75 07/21/2017   BUN 25 (H) 07/21/2017   CO2 27 07/21/2017   TSH 1.92 08/14/2016   HGBA1C 6.2 07/21/2017   MICROALBUR 5.7 (H) 04/21/2017    Dg Abd 1 View  Result Date: 02/29/2016 CLINICAL DATA:  Preop for left ureteral stone. EXAM: ABDOMEN - 1 VIEW COMPARISON:  02/29/2016 at 8:55 a.m. FINDINGS: Oval calculus in the left lower pelvis is stable consistent with a distal ureteral calculus. Other small right inferior pelvic densities are most consistent with phleboliths. Possible small left lower pole intrarenal stone. No other evidence of an intrarenal stone. Soft tissues are otherwise unremarkable. Normal bowel gas pattern. There are disc degenerative changes at L4-L5. IMPRESSION: Stable calculus in the region of the left ureterovesicular junction. Possible small left intrarenal stone. Electronically Signed   By: Lajean Manes M.D.   On: 02/29/2016 12:46   Assessment & Plan:   Problem List Items Addressed This Visit    Hyperlipidemia with target LDL less than 70    LDL and triglycerides are at goal on current medications. He has no side effects and  liver enzymes are normal. No changes today   Lab Results  Component Value Date   CHOL 180 04/21/2017   HDL 43.40 04/21/2017   LDLCALC 99 04/21/2017   LDLDIRECT 95.0 08/14/2016   TRIG 187.0 (H) 04/21/2017   CHOLHDL 4 04/21/2017   Lab Results  Component Value Date   ALT 39 07/21/2017   AST 22 07/21/2017   ALKPHOS 49 07/21/2017   BILITOT 0.4 07/21/2017         NAFLD (nonalcoholic fatty liver disease) - Primary    Managed with statin, low glycemic index  and control of diabetes,  Liver enzymes remain  Normal.  Liver was imaged during recent ct scan for kidney stones  Lab Results  Component Value Date   ALT 39 07/21/2017   AST 22 07/21/2017   ALKPHOS 49 07/21/2017   BILITOT 0.4 07/21/2017  Relevant Orders   Comprehensive metabolic panel (Completed)   Comprehensive metabolic panel   Uncontrolled type 2 diabetes mellitus with macroalbuminuric diabetic nephropathy (HCC)    Improved control om  current medications .  hemoglobin A1c is down from 7.3 to 6.2 on Jardiance 25 mg and glipizide  5 mg bid. is reminded to schedule an annual eye exam and foot exam is normal today. Patient has  microalbuminuria. Patient is tolerating statin therapy for CAD risk reduction and on ACE/ARB for renal protection and hypertension   Lab Results  Component Value Date   HGBA1C 6.2 07/21/2017   Lab Results  Component Value Date   MICROALBUR 5.7 (H) 04/21/2017          Relevant Orders   Hemoglobin A1c (Completed)   Hemoglobin A1c   Lipid panel     A total of 25 minutes of face to face time was spent with patient more than half of which was spent in counselling about the above mentioned conditions  and coordination of care   I am having Kito B. Lichtenwalner maintain his naproxen sodium, metFORMIN, lisinopril, glipiZIDE, ALPRAZolam, glucose blood, empagliflozin, and atorvastatin.  No orders of the defined types were placed in this encounter.   Medications Discontinued During This  Encounter  Medication Reason  . metFORMIN (GLUCOPHAGE) 5400 MG tablet Duplicate    Follow-up: Return in about 4 months (around 11/19/2017) for follow up diabetes.   [name], MD

## 2017-07-21 NOTE — Patient Instructions (Addendum)
We have mutually agreed to suspend your atorvastatin (cholesterol) in favor of trying daily organiC apple cider vinegar with "the mother"    Return in 4 months ,  Have fasting labs done PRIOR TO VISIT

## 2017-07-22 ENCOUNTER — Encounter: Payer: Self-pay | Admitting: Internal Medicine

## 2017-07-22 NOTE — Assessment & Plan Note (Signed)
Managed with statin, low glycemic index  and control of diabetes,  Liver enzymes remain  Normal.  Liver was imaged during recent ct scan for kidney stones  Lab Results  Component Value Date   ALT 39 07/21/2017   AST 22 07/21/2017   ALKPHOS 49 07/21/2017   BILITOT 0.4 07/21/2017

## 2017-07-22 NOTE — Assessment & Plan Note (Signed)
LDL and triglycerides are at goal on current medications. He has no side effects and liver enzymes are normal. No changes today   Lab Results  Component Value Date   CHOL 180 04/21/2017   HDL 43.40 04/21/2017   LDLCALC 99 04/21/2017   LDLDIRECT 95.0 08/14/2016   TRIG 187.0 (H) 04/21/2017   CHOLHDL 4 04/21/2017   Lab Results  Component Value Date   ALT 39 07/21/2017   AST 22 07/21/2017   ALKPHOS 49 07/21/2017   BILITOT 0.4 07/21/2017

## 2017-07-22 NOTE — Assessment & Plan Note (Signed)
Improved control om  current medications .  hemoglobin A1c is down from 7.3 to 6.2 on Jardiance 25 mg and glipizide  5 mg bid. is reminded to schedule an annual eye exam and foot exam is normal today. Patient has  microalbuminuria. Patient is tolerating statin therapy for CAD risk reduction and on ACE/ARB for renal protection and hypertension   Lab Results  Component Value Date   HGBA1C 6.2 07/21/2017   Lab Results  Component Value Date   MICROALBUR 5.7 (H) 04/21/2017

## 2017-10-15 ENCOUNTER — Other Ambulatory Visit: Payer: Self-pay | Admitting: Internal Medicine

## 2017-11-04 ENCOUNTER — Other Ambulatory Visit: Payer: Self-pay | Admitting: Internal Medicine

## 2017-11-04 NOTE — Telephone Encounter (Signed)
Refilled: 04/09/2017 Last OV: 07/21/2017 Next OV: 11/19/2017

## 2017-11-05 NOTE — Telephone Encounter (Signed)
Printed, signed and faxed.  

## 2017-11-12 ENCOUNTER — Other Ambulatory Visit (INDEPENDENT_AMBULATORY_CARE_PROVIDER_SITE_OTHER): Payer: 59

## 2017-11-12 DIAGNOSIS — IMO0002 Reserved for concepts with insufficient information to code with codable children: Secondary | ICD-10-CM

## 2017-11-12 DIAGNOSIS — E1121 Type 2 diabetes mellitus with diabetic nephropathy: Secondary | ICD-10-CM

## 2017-11-12 DIAGNOSIS — K760 Fatty (change of) liver, not elsewhere classified: Secondary | ICD-10-CM | POA: Diagnosis not present

## 2017-11-12 DIAGNOSIS — E1165 Type 2 diabetes mellitus with hyperglycemia: Secondary | ICD-10-CM | POA: Diagnosis not present

## 2017-11-12 LAB — LIPID PANEL
CHOL/HDL RATIO: 4
Cholesterol: 148 mg/dL (ref 0–200)
HDL: 40.7 mg/dL (ref >39.00)
LDL Cholesterol: 86 mg/dL (ref 0–99)
NONHDL: 107.1
TRIGLYCERIDES: 107 mg/dL (ref 0.0–149.0)
VLDL: 21.4 mg/dL (ref 0.0–40.0)

## 2017-11-12 LAB — COMPREHENSIVE METABOLIC PANEL
ALK PHOS: 47 U/L (ref 39–117)
ALT: 37 U/L (ref 0–53)
AST: 24 U/L (ref 0–37)
Albumin: 4.5 g/dL (ref 3.5–5.2)
BILIRUBIN TOTAL: 0.6 mg/dL (ref 0.2–1.2)
BUN: 16 mg/dL (ref 6–23)
CALCIUM: 9.3 mg/dL (ref 8.4–10.5)
CO2: 28 meq/L (ref 19–32)
Chloride: 102 mEq/L (ref 96–112)
Creatinine, Ser: 0.72 mg/dL (ref 0.40–1.50)
GFR: 127.22 mL/min (ref >60.00)
GLUCOSE: 107 mg/dL — AB (ref 70–99)
POTASSIUM: 4.7 meq/L (ref 3.5–5.1)
Sodium: 137 mEq/L (ref 135–145)
Total Protein: 7.1 g/dL (ref 6.0–8.3)

## 2017-11-12 LAB — HEMOGLOBIN A1C: Hgb A1c MFr Bld: 6.5 % (ref 4.6–6.5)

## 2017-11-17 ENCOUNTER — Other Ambulatory Visit: Payer: Self-pay | Admitting: Internal Medicine

## 2017-11-19 ENCOUNTER — Ambulatory Visit: Payer: 59 | Admitting: Internal Medicine

## 2017-12-10 ENCOUNTER — Other Ambulatory Visit: Payer: Self-pay | Admitting: Internal Medicine

## 2017-12-10 NOTE — Telephone Encounter (Signed)
Refilled: 11/04/2017 Last OV: 07/21/2017 Next OV: 01/05/2018

## 2017-12-11 NOTE — Telephone Encounter (Signed)
Printed, signed and faxed.  

## 2018-01-05 ENCOUNTER — Ambulatory Visit: Payer: 59 | Admitting: Internal Medicine

## 2018-01-18 ENCOUNTER — Other Ambulatory Visit: Payer: Self-pay | Admitting: Internal Medicine

## 2018-01-22 ENCOUNTER — Other Ambulatory Visit: Payer: Self-pay | Admitting: Internal Medicine

## 2018-01-22 NOTE — Telephone Encounter (Signed)
Refilled: 12/10/2017 Last OV: 07/21/2017 Next OV: 02/10/2018

## 2018-02-10 ENCOUNTER — Encounter: Payer: Self-pay | Admitting: Internal Medicine

## 2018-02-10 ENCOUNTER — Ambulatory Visit: Payer: 59 | Admitting: Internal Medicine

## 2018-02-10 DIAGNOSIS — E785 Hyperlipidemia, unspecified: Secondary | ICD-10-CM | POA: Diagnosis not present

## 2018-02-10 DIAGNOSIS — E119 Type 2 diabetes mellitus without complications: Secondary | ICD-10-CM

## 2018-02-10 DIAGNOSIS — E1121 Type 2 diabetes mellitus with diabetic nephropathy: Secondary | ICD-10-CM | POA: Diagnosis not present

## 2018-02-10 DIAGNOSIS — F411 Generalized anxiety disorder: Secondary | ICD-10-CM | POA: Diagnosis not present

## 2018-02-10 MED ORDER — ALPRAZOLAM 0.5 MG PO TABS: 0.5000 mg | ORAL_TABLET | Freq: Every evening | ORAL | 5 refills | Status: DC | PRN

## 2018-02-10 NOTE — Patient Instructions (Addendum)
See you in 6 months  Return for non Fasting labs in 3 months, no office visit needed

## 2018-02-10 NOTE — Progress Notes (Signed)
Subjective:  Patient ID: Andrew Molina, male    DOB: [birth year]  Age: 42 y.o. MRN: [id number]  CC: Diagnoses of Diabetes mellitus without complication ([name]), Controlled type 2 diabetes mellitus with microalbuminuric diabetic nephropathy ([name]), Generalized anxiety disorder, and Hyperlipidemia with target LDL less than 70 were pertinent to this visit.  HPI [name] presents for 6 month follow up on diabetes.  Patient has no complaints today.  Patient is following a low glycemic index diet and taking all prescribed medications regularly without side effects.  Fasting sugars have been under less than 140 most of the time and post prandials have been under 160 except on rare occasions. Patient is exercising about 3 times per week and intentionally trying to lose weight .  Patient has not had an eye exam in the last 12 months and checks feet regularly for signs of infection.  Patient does not walk barefoot outside,  And denies an numbness tingling or burning in feet. Patient is up to date on all recommended vaccinations  Insurance not covering eye exam.  Scheduled for September   Tried Using CBD mints for back pain ,  Worked miraculously initially,  Was taking it daily and  pain free for a week .  Still helping his anxiety no longer helping his back pain       Lab Results  Component Value Date   HGBA1C 6.5 11/12/2017      Outpatient Medications Prior to Visit  Medication Sig Dispense Refill  . atorvastatin (LIPITOR) 20 MG tablet TAKE ONE TABLET BY MOUTH ONCE DAILY 90 tablet 1  . glipiZIDE (GLUCOTROL) 10 MG tablet TAKE ONE TABLET BY MOUTH TWICE DAILY BEFORE  MEALS. 180 tablet 3  . glucose blood test strip Check sugar twice daily. One Touch Ultra strips. Dx: E11.65 200 each 5  . JARDIANCE 25 MG TABS tablet TAKE 1 TABLET BY MOUTH ONCE DAILY 90 tablet 1  . lisinopril (PRINIVIL,ZESTRIL) 5 MG tablet TAKE 1 TABLET BY MOUTH ONCE DAILY 90 tablet 1  . metFORMIN (GLUCOPHAGE) 1000 MG tablet TAKE ONE  TABLET BY MOUTH TWICE DAILY 180 tablet 1  . metFORMIN (GLUCOPHAGE) 1000 MG tablet TAKE 1 TABLET BY MOUTH TWICE DAILY 180 tablet 1  . naproxen sodium (ANAPROX) 220 MG tablet Take 220 mg by mouth 2 (two) times daily with a meal.    . ALPRAZolam (XANAX) 0.5 MG tablet TAKE 1 TABLET BY MOUTH AT BEDTIME AS NEEDED FOR SLEEP 30 tablet 0   No facility-administered medications prior to visit.     Review of Systems;  Patient denies headache, fevers, malaise, unintentional weight loss, skin rash, eye pain, sinus congestion and sinus pain, sore throat, dysphagia,  hemoptysis , cough, dyspnea, wheezing, chest pain, palpitations, orthopnea, edema, abdominal pain, nausea, melena, diarrhea, constipation, flank pain, dysuria, hematuria, urinary  Frequency, nocturia, numbness, tingling, seizures,  Focal weakness, Loss of consciousness,  Tremor, insomnia, depression, anxiety, and suicidal ideation.      Objective:  BP 110/76 (BP Location: Left Arm, Patient Position: Sitting, Cuff Size: Normal)   Pulse 74   Temp 98.3 F (36.8 C) (Oral)   Resp 16   Ht 5' 10"  (1.778 m)   Wt 173 lb 9.6 oz (78.7 kg)   SpO2 98%   BMI 24.91 kg/m   BP Readings from Last 3 Encounters:  02/10/18 110/76  07/21/17 128/70  04/21/17 116/74    Wt Readings from Last 3 Encounters:  02/10/18 173 lb 9.6 oz (78.7 kg)  07/21/17  172 lb 3.2 oz (78.1 kg)  04/21/17 176 lb 9.6 oz (80.1 kg)    General appearance: alert, cooperative and appears stated age Ears: normal TM's and external ear canals both ears Throat: lips, mucosa, and tongue normal; teeth and gums normal Neck: no adenopathy, no carotid bruit, supple, symmetrical, trachea midline and thyroid not enlarged, symmetric, no tenderness/mass/nodules Back: symmetric, no curvature. ROM normal. No CVA tenderness. Lungs: clear to auscultation bilaterally Heart: regular rate and rhythm, S1, S2 normal, no murmur, click, rub or gallop Abdomen: soft, non-tender; bowel sounds normal; no  masses,  no organomegaly Pulses: 2+ and symmetric Skin: Skin color, texture, turgor normal. No rashes or lesions Lymph nodes: Cervical, supraclavicular, and axillary nodes normal.  Lab Results  Component Value Date   HGBA1C 6.5 11/12/2017   HGBA1C 6.2 07/21/2017   HGBA1C 7.9 (H) 04/21/2017    Lab Results  Component Value Date   CREATININE 0.72 11/12/2017   CREATININE 0.75 07/21/2017   CREATININE 0.70 04/21/2017    Lab Results  Component Value Date   WBC 6.6 02/27/2016   HGB 13.4 02/27/2016   HCT 39.1 02/27/2016   PLT 250.0 02/27/2016   GLUCOSE 107 (H) 11/12/2017   CHOL 148 11/12/2017   TRIG 107.0 11/12/2017   HDL 40.70 11/12/2017   LDLDIRECT 95.0 08/14/2016   LDLCALC 86 11/12/2017   ALT 37 11/12/2017   AST 24 11/12/2017   NA 137 11/12/2017   K 4.7 11/12/2017   CL 102 11/12/2017   CREATININE 0.72 11/12/2017   BUN 16 11/12/2017   CO2 28 11/12/2017   TSH 1.92 08/14/2016   HGBA1C 6.5 11/12/2017   MICROALBUR 5.7 (H) 04/21/2017    Dg Abd 1 View  Result Date: 02/29/2016 CLINICAL DATA:  Preop for left ureteral stone. EXAM: ABDOMEN - 1 VIEW COMPARISON:  02/29/2016 at 8:55 a.m. FINDINGS: Oval calculus in the left lower pelvis is stable consistent with a distal ureteral calculus. Other small right inferior pelvic densities are most consistent with phleboliths. Possible small left lower pole intrarenal stone. No other evidence of an intrarenal stone. Soft tissues are otherwise unremarkable. Normal bowel gas pattern. There are disc degenerative changes at L4-L5. IMPRESSION: Stable calculus in the region of the left ureterovesicular junction. Possible small left intrarenal stone. Electronically Signed   By: Lajean Manes M.D.   On: 02/29/2016 12:46   Assessment & Plan:   Problem List Items Addressed This Visit    Controlled type 2 diabetes mellitus with microalbuminuric diabetic nephropathy ([place])    Continue jardiance , lisinopril, atorvastatin and metformin      Diabetes  mellitus without complication ([name])    Improved control with addition of Jardiance.  Increased urination noted without symptoms of infection.  Warned about potential cellulitis .   No changes today.   Lab Results  Component Value Date   HGBA1C 6.5 11/12/2017   Lab Results  Component Value Date   MICROALBUR 5.7 (H) 04/21/2017         Relevant Orders   Hemoglobin A1c   Comprehensive metabolic panel   Microalbumin / creatinine urine ratio   Generalized anxiety disorder    Managed with rare use of xamax. Did not tolerate Effexor trial.       Relevant Medications   ALPRAZolam (XANAX) 0.5 MG tablet   Hyperlipidemia with target LDL less than 70    LDL and triglycerides are at goal on current medications. He has no side effects and liver enzymes are normal. No changes today  Lab Results  Component Value Date   CHOL 148 11/12/2017   HDL 40.70 11/12/2017   LDLCALC 86 11/12/2017   LDLDIRECT 95.0 08/14/2016   TRIG 107.0 11/12/2017   CHOLHDL 4 11/12/2017   Lab Results  Component Value Date   ALT 37 11/12/2017   AST 24 11/12/2017   ALKPHOS 47 11/12/2017   BILITOT 0.6 11/12/2017            I have changed Trequan B. Tiller's ALPRAZolam. I am also having him maintain his naproxen sodium, metFORMIN, glipiZIDE, glucose blood, atorvastatin, lisinopril, JARDIANCE, and metFORMIN.  Meds ordered this encounter  Medications  . ALPRAZolam (XANAX) 0.5 MG tablet    Sig: Take 1 tablet (0.5 mg total) by mouth at bedtime as needed. for sleep    Dispense:  30 tablet    Refill:  5    Medications Discontinued During This Encounter  Medication Reason  . ALPRAZolam (XANAX) 0.5 MG tablet Reorder    Follow-up: Return in about 6 months (around 08/13/2018).   [name], MD

## 2018-02-10 NOTE — Assessment & Plan Note (Addendum)
Improved control with addition of Jardiance.  Increased urination noted without symptoms of infection.  Warned about potential cellulitis .   No changes today.   Lab Results  Component Value Date   HGBA1C 6.5 11/12/2017   Lab Results  Component Value Date   MICROALBUR 5.7 (H) 04/21/2017

## 2018-02-11 DIAGNOSIS — E1121 Type 2 diabetes mellitus with diabetic nephropathy: Secondary | ICD-10-CM | POA: Insufficient documentation

## 2018-02-11 NOTE — Assessment & Plan Note (Signed)
Managed with rare use of xamax. Did not tolerate Effexor trial.

## 2018-02-11 NOTE — Assessment & Plan Note (Signed)
Continue jardiance , lisinopril, atorvastatin and metformin

## 2018-02-11 NOTE — Assessment & Plan Note (Signed)
LDL and triglycerides are at goal on current medications. He has no side effects and liver enzymes are normal. No changes today   Lab Results  Component Value Date   CHOL 148 11/12/2017   HDL 40.70 11/12/2017   LDLCALC 86 11/12/2017   LDLDIRECT 95.0 08/14/2016   TRIG 107.0 11/12/2017   CHOLHDL 4 11/12/2017   Lab Results  Component Value Date   ALT 37 11/12/2017   AST 24 11/12/2017   ALKPHOS 47 11/12/2017   BILITOT 0.6 11/12/2017

## 2018-03-14 ENCOUNTER — Other Ambulatory Visit: Payer: Self-pay | Admitting: Internal Medicine

## 2018-04-02 ENCOUNTER — Other Ambulatory Visit: Payer: Self-pay | Admitting: Internal Medicine

## 2018-05-13 ENCOUNTER — Other Ambulatory Visit (INDEPENDENT_AMBULATORY_CARE_PROVIDER_SITE_OTHER): Payer: 59

## 2018-05-13 DIAGNOSIS — E119 Type 2 diabetes mellitus without complications: Secondary | ICD-10-CM | POA: Diagnosis not present

## 2018-05-13 LAB — COMPREHENSIVE METABOLIC PANEL
ALT: 39 U/L (ref 0–53)
AST: 20 U/L (ref 0–37)
Albumin: 4.5 g/dL (ref 3.5–5.2)
Alkaline Phosphatase: 62 U/L (ref 39–117)
BUN: 20 mg/dL (ref 6–23)
CO2: 28 meq/L (ref 19–32)
Calcium: 9.6 mg/dL (ref 8.4–10.5)
Chloride: 100 mEq/L (ref 96–112)
Creatinine, Ser: 0.75 mg/dL (ref 0.40–1.50)
GFR: 121.08 mL/min (ref >60.00)
GLUCOSE: 200 mg/dL — AB (ref 70–99)
POTASSIUM: 4.4 meq/L (ref 3.5–5.1)
SODIUM: 136 meq/L (ref 135–145)
TOTAL PROTEIN: 7.1 g/dL (ref 6.0–8.3)
Total Bilirubin: 0.4 mg/dL (ref 0.2–1.2)

## 2018-05-13 LAB — HEMOGLOBIN A1C: Hgb A1c MFr Bld: 6.9 % — ABNORMAL HIGH (ref 4.6–6.5)

## 2018-05-13 LAB — MICROALBUMIN / CREATININE URINE RATIO
CREATININE, U: 53.3 mg/dL
Microalb Creat Ratio: 10.6 mg/g (ref 0.0–30.0)
Microalb, Ur: 5.7 mg/dL — ABNORMAL HIGH (ref 0.0–1.9)

## 2018-07-02 ENCOUNTER — Other Ambulatory Visit: Payer: Self-pay | Admitting: Internal Medicine

## 2018-07-16 LAB — HM DIABETES EYE EXAM

## 2018-08-10 DIAGNOSIS — E119 Type 2 diabetes mellitus without complications: Secondary | ICD-10-CM | POA: Diagnosis not present

## 2018-08-10 LAB — HM DIABETES EYE EXAM

## 2018-08-13 ENCOUNTER — Ambulatory Visit: Payer: 59 | Admitting: Internal Medicine

## 2018-08-13 ENCOUNTER — Encounter: Payer: Self-pay | Admitting: Internal Medicine

## 2018-08-13 VITALS — BP 102/70 | HR 84 | Temp 98.0°F | Resp 14 | Ht 70.0 in | Wt 178.0 lb

## 2018-08-13 DIAGNOSIS — F411 Generalized anxiety disorder: Secondary | ICD-10-CM

## 2018-08-13 DIAGNOSIS — E785 Hyperlipidemia, unspecified: Secondary | ICD-10-CM | POA: Diagnosis not present

## 2018-08-13 DIAGNOSIS — E119 Type 2 diabetes mellitus without complications: Secondary | ICD-10-CM

## 2018-08-13 DIAGNOSIS — Z79899 Other long term (current) drug therapy: Secondary | ICD-10-CM

## 2018-08-13 DIAGNOSIS — K760 Fatty (change of) liver, not elsewhere classified: Secondary | ICD-10-CM

## 2018-08-13 DIAGNOSIS — I10 Essential (primary) hypertension: Secondary | ICD-10-CM

## 2018-08-13 DIAGNOSIS — E1121 Type 2 diabetes mellitus with diabetic nephropathy: Secondary | ICD-10-CM

## 2018-08-13 LAB — URINALYSIS, ROUTINE W REFLEX MICROSCOPIC
Bilirubin Urine: NEGATIVE
Hgb urine dipstick: NEGATIVE
Ketones, ur: 15 — AB
Leukocytes, UA: NEGATIVE
Nitrite: NEGATIVE
PH: 5.5 (ref 5.0–8.0)
RBC / HPF: NONE SEEN (ref 0–?)
Specific Gravity, Urine: 1.015 (ref 1.000–1.030)
Total Protein, Urine: NEGATIVE
Urine Glucose: >=1000 — AB
Urobilinogen, UA: 0.2 (ref 0.0–1.0)
WBC, UA: NONE SEEN (ref 0–?)

## 2018-08-13 MED ORDER — LISINOPRIL 2.5 MG PO TABS: 2.5000 mg | ORAL_TABLET | Freq: Every day | ORAL | 1 refills | Status: DC

## 2018-08-13 MED ORDER — ALPRAZOLAM 1 MG PO TABS: 1.0000 mg | ORAL_TABLET | Freq: Every day | ORAL | 3 refills | Status: DC | PRN

## 2018-08-13 NOTE — Progress Notes (Signed)
Subjective:  Patient ID: Andrew Molina, male    DOB: 10/30/1975  Age: 43 y.o. MRN: [id number]  CC: The primary encounter diagnosis was NAFLD (nonalcoholic fatty liver disease). Diagnoses of Hyperlipidemia with target LDL less than 70, Diabetes mellitus without complication ([place]), Long-term use of high-risk medication, Essential hypertension, Controlled type 2 diabetes mellitus with microalbuminuric diabetic nephropathy ([place]), and Generalized anxiety disorder were also pertinent to this visit.  HPI Andrew Molina presents for follow up  3 month follow up on diabetes.  Patient has no complaints today.  Patient is following a low glycemic index diet and taking all prescribed medications regularly without side effects.  Fasting sugars have been under less than 140 most of the time and post prandials have been under 160 except on rare occasions. Patient is exercising about 3 times per week and intentionally trying to lose weight .  Patient has had an eye exam in the last month and checks feet regularly for signs of infection.  Patient does not walk barefoot outside,  And denies any numbness tingling or burning in feet. Patient is up to date on all recommended vaccinations    Hypertension: patient checks blood pressure twice weekly at home.  Readings have been for the most part < 110/70. at rest . Patient is following a reduced salt diet most days and is taking medications as prescribed  GAD: he has been having   Outpatient Medications Prior to Visit  Medication Sig Dispense Refill  . atorvastatin (LIPITOR) 20 MG tablet TAKE 1 TABLET BY MOUTH ONCE DAILY 90 tablet 1  . glipiZIDE (GLUCOTROL) 10 MG tablet TAKE 1 TABLET BY MOUTH TWICE DAILY BEFORE  MEALS 180 tablet 1  . glucose blood test strip Check sugar twice daily. One Touch Ultra strips. Dx: E11.65 200 each 5  . JARDIANCE 25 MG TABS tablet TAKE 1 TABLET BY MOUTH ONCE DAILY 90 tablet 1  . metFORMIN (GLUCOPHAGE) 1000 MG tablet TAKE ONE TABLET BY MOUTH  TWICE DAILY 180 tablet 1  . metFORMIN (GLUCOPHAGE) 1000 MG tablet TAKE 1 TABLET BY MOUTH TWICE DAILY 180 tablet 1  . naproxen sodium (ANAPROX) 220 MG tablet Take 220 mg by mouth 2 (two) times daily with a meal.    . ALPRAZolam (XANAX) 0.5 MG tablet Take 1 tablet (0.5 mg total) by mouth at bedtime as needed. for sleep 30 tablet 5  . lisinopril (PRINIVIL,ZESTRIL) 5 MG tablet TAKE 1 TABLET BY MOUTH ONCE DAILY 90 tablet 1   No facility-administered medications prior to visit.     Review of Systems;  Patient denies headache, fevers, malaise, unintentional weight loss, skin rash, eye pain, sinus congestion and sinus pain, sore throat, dysphagia,  hemoptysis , cough, dyspnea, wheezing, chest pain, palpitations, orthopnea, edema, abdominal pain, nausea, melena, diarrhea, constipation, flank pain, dysuria, hematuria, urinary  Frequency, nocturia, numbness, tingling, seizures,  Focal weakness, Loss of consciousness,  Tremor, insomnia, depression, anxiety, and suicidal ideation.      Objective:  BP 102/70 (BP Location: Left Arm, Patient Position: Sitting, Cuff Size: Normal)   Pulse 84   Temp 98 F (36.7 C) (Oral)   Resp 14   Ht 5' 10"  (1.778 m)   Wt 178 lb (80.7 kg)   SpO2 97%   BMI 25.54 kg/m   BP Readings from Last 3 Encounters:  08/13/18 102/70  02/10/18 110/76  07/21/17 128/70    Wt Readings from Last 3 Encounters:  08/13/18 178 lb (80.7 kg)  02/10/18 173 lb 9.6 oz (  78.7 kg)  07/21/17 172 lb 3.2 oz (78.1 kg)    General appearance: alert, cooperative and appears stated age Ears: normal TM's and external ear canals both ears Throat: lips, mucosa, and tongue normal; teeth and gums normal Neck: no adenopathy, no carotid bruit, supple, symmetrical, trachea midline and thyroid not enlarged, symmetric, no tenderness/mass/nodules Back: symmetric, no curvature. ROM normal. No CVA tenderness. Lungs: clear to auscultation bilaterally Heart: regular rate and rhythm, S1, S2 normal, no  murmur, click, rub or gallop Abdomen: soft, non-tender; bowel sounds normal; no masses,  no organomegaly Pulses: 2+ and symmetric Skin: Skin color, texture, turgor normal. No rashes or lesions Lymph nodes: Cervical, supraclavicular, and axillary nodes normal.  Lab Results  Component Value Date   HGBA1C 6.9 (H) 05/13/2018   HGBA1C 6.5 11/12/2017   HGBA1C 6.2 07/21/2017    Lab Results  Component Value Date   CREATININE 0.75 05/13/2018   CREATININE 0.72 11/12/2017   CREATININE 0.75 07/21/2017    Lab Results  Component Value Date   WBC 6.6 02/27/2016   HGB 13.4 02/27/2016   HCT 39.1 02/27/2016   PLT 250.0 02/27/2016   GLUCOSE 200 (H) 05/13/2018   CHOL 148 11/12/2017   TRIG 107.0 11/12/2017   HDL 40.70 11/12/2017   LDLDIRECT 95.0 08/14/2016   LDLCALC 86 11/12/2017   ALT 39 05/13/2018   AST 20 05/13/2018   NA 136 05/13/2018   K 4.4 05/13/2018   CL 100 05/13/2018   CREATININE 0.75 05/13/2018   BUN 20 05/13/2018   CO2 28 05/13/2018   TSH 1.92 08/14/2016   HGBA1C 6.9 (H) 05/13/2018   MICROALBUR 5.7 (H) 05/13/2018    Dg Abd 1 View  Result Date: 02/29/2016 CLINICAL DATA:  Preop for left ureteral stone. EXAM: ABDOMEN - 1 VIEW COMPARISON:  02/29/2016 at 8:55 a.m. FINDINGS: Oval calculus in the left lower pelvis is stable consistent with a distal ureteral calculus. Other small right inferior pelvic densities are most consistent with phleboliths. Possible small left lower pole intrarenal stone. No other evidence of an intrarenal stone. Soft tissues are otherwise unremarkable. Normal bowel gas pattern. There are disc degenerative changes at L4-L5. IMPRESSION: Stable calculus in the region of the left ureterovesicular junction. Possible small left intrarenal stone. Electronically Signed   By: Lajean Manes M.D.   On: 02/29/2016 12:46   Assessment & Plan:   Problem List Items Addressed This Visit    Controlled type 2 diabetes mellitus with microalbuminuric diabetic nephropathy  ([place])    Continue jardiance , lisinopril, atorvastatin and metformin.  Repeat labs in April   Lab Results  Component Value Date   HGBA1C 6.9 (H) 05/13/2018         Relevant Medications   lisinopril (PRINIVIL,ZESTRIL) 2.5 MG tablet (Start on 09/03/2018)   Diabetes mellitus without complication (H[facility])   Relevant Medications   lisinopril (PRINIVIL,ZESTRIL) 2.5 MG tablet (Start on 09/03/2018)   Other Relevant Orders   Hemoglobin A1c   Generalized anxiety disorder    Managed with rare use of xamax. Did not tolerate Effexor trial. He has had to double the dose when used. The risks and benefits of benzodiazepine use were discussed with patient today including excessive sedation leading to respiratory depression,  impaired thinking/driving, and addiction.  Patient was advised to avoid concurrent use with alcohol, to use medication only as needed and not to share with others  .       Relevant Medications   ALPRAZolam (XANAX) 1 MG tablet   Hyperlipidemia  with target LDL less than 70   Relevant Medications   lisinopril (PRINIVIL,ZESTRIL) 2.5 MG tablet (Start on 09/03/2018)   Other Relevant Orders   Lipid panel   Hypertension    reducig lisinopril dose for hypotension   Lab Results  Component Value Date   CREATININE 0.75 05/13/2018   Lab Results  Component Value Date   NA 136 05/13/2018   K 4.4 05/13/2018   CL 100 05/13/2018   CO2 28 05/13/2018         Relevant Medications   lisinopril (PRINIVIL,ZESTRIL) 2.5 MG tablet (Start on 09/03/2018)   NAFLD (nonalcoholic fatty liver disease) - Primary   Relevant Orders   Comprehensive metabolic panel    Other Visit Diagnoses    Long-term use of high-risk medication       Relevant Orders   Urinalysis, Routine w reflex microscopic (Completed)    A total of 25 minutes of face to face time was spent with patient more than half of which was spent in counselling about the above mentioned conditions  and coordination of care   I have changed  Kallon B. Haney's ALPRAZolam and lisinopril. I am also having him maintain his naproxen sodium, metFORMIN, glucose blood, metFORMIN, atorvastatin, glipiZIDE, and JARDIANCE.  Meds ordered this encounter  Medications  . ALPRAZolam (XANAX) 1 MG tablet    Sig: Take 1 tablet (1 mg total) by mouth daily as needed. for sleep    Dispense:  30 tablet    Refill:  3  . lisinopril (PRINIVIL,ZESTRIL) 2.5 MG tablet    Sig: Take 1 tablet (2.5 mg total) by mouth daily.    Dispense:  90 tablet    Refill:  1    Medications Discontinued During This Encounter  Medication Reason  . ALPRAZolam (XANAX) 0.5 MG tablet   . lisinopril (PRINIVIL,ZESTRIL) 5 MG tablet     Follow-up: Return in about 6 months (around 02/11/2019) for follow up diabetes.   [name], MD

## 2018-08-13 NOTE — Patient Instructions (Addendum)
I have reduced your lisinopril to 2.5 mg daily  I have increased your alprazolam strength to 1 mg daily   Return for fasting labs in April  I'll see you in July

## 2018-08-16 NOTE — Assessment & Plan Note (Signed)
Managed with rare use of xamax. Did not tolerate Effexor trial. He has had to double the dose when used. The risks and benefits of benzodiazepine use were discussed with patient today including excessive sedation leading to respiratory depression,  impaired thinking/driving, and addiction.  Patient was advised to avoid concurrent use with alcohol, to use medication only as needed and not to share with others  .

## 2018-08-16 NOTE — Assessment & Plan Note (Signed)
reducig lisinopril dose for hypotension   Lab Results  Component Value Date   CREATININE 0.75 05/13/2018   Lab Results  Component Value Date   NA 136 05/13/2018   K 4.4 05/13/2018   CL 100 05/13/2018   CO2 28 05/13/2018

## 2018-08-16 NOTE — Assessment & Plan Note (Signed)
Continue jardiance , lisinopril, atorvastatin and metformin.  Repeat labs in April   Lab Results  Component Value Date   HGBA1C 6.9 (H) 05/13/2018

## 2018-08-21 ENCOUNTER — Other Ambulatory Visit: Payer: Self-pay | Admitting: Internal Medicine

## 2018-10-13 ENCOUNTER — Other Ambulatory Visit: Payer: Self-pay | Admitting: Internal Medicine

## 2018-10-27 ENCOUNTER — Telehealth: Payer: Self-pay | Admitting: Internal Medicine

## 2018-11-12 ENCOUNTER — Other Ambulatory Visit: Payer: 59

## 2018-11-15 ENCOUNTER — Other Ambulatory Visit: Payer: Self-pay | Admitting: Internal Medicine

## 2018-11-22 ENCOUNTER — Other Ambulatory Visit: Payer: Self-pay | Admitting: Internal Medicine

## 2018-12-20 ENCOUNTER — Other Ambulatory Visit: Payer: Self-pay | Admitting: Internal Medicine

## 2018-12-21 NOTE — Telephone Encounter (Signed)
Refilled: 08/13/2018 Last OV: 08/13/2018 Next OV: 02/11/2019

## 2019-01-16 ENCOUNTER — Other Ambulatory Visit: Payer: Self-pay | Admitting: Internal Medicine

## 2019-02-09 ENCOUNTER — Other Ambulatory Visit: Payer: Self-pay | Admitting: Internal Medicine

## 2019-02-09 NOTE — Telephone Encounter (Signed)
Refilled: 12/21/2018 Last OV: 08/13/2018 Next OV: 02/11/2019

## 2019-02-11 ENCOUNTER — Ambulatory Visit: Payer: 59 | Admitting: Internal Medicine

## 2019-02-11 ENCOUNTER — Other Ambulatory Visit (INDEPENDENT_AMBULATORY_CARE_PROVIDER_SITE_OTHER): Payer: 59

## 2019-02-11 ENCOUNTER — Other Ambulatory Visit: Payer: Self-pay

## 2019-02-11 ENCOUNTER — Ambulatory Visit (INDEPENDENT_AMBULATORY_CARE_PROVIDER_SITE_OTHER): Payer: 59 | Admitting: Internal Medicine

## 2019-02-11 ENCOUNTER — Ambulatory Visit (INDEPENDENT_AMBULATORY_CARE_PROVIDER_SITE_OTHER): Payer: 59

## 2019-02-11 ENCOUNTER — Inpatient Hospital Stay: Admission: RE | Admit: 2019-02-11 | Payer: 59 | Source: Ambulatory Visit

## 2019-02-11 DIAGNOSIS — F411 Generalized anxiety disorder: Secondary | ICD-10-CM

## 2019-02-11 DIAGNOSIS — K760 Fatty (change of) liver, not elsewhere classified: Secondary | ICD-10-CM

## 2019-02-11 DIAGNOSIS — M5386 Other specified dorsopathies, lumbar region: Secondary | ICD-10-CM

## 2019-02-11 DIAGNOSIS — I10 Essential (primary) hypertension: Secondary | ICD-10-CM

## 2019-02-11 DIAGNOSIS — E119 Type 2 diabetes mellitus without complications: Secondary | ICD-10-CM

## 2019-02-11 DIAGNOSIS — E785 Hyperlipidemia, unspecified: Secondary | ICD-10-CM

## 2019-02-11 DIAGNOSIS — E1121 Type 2 diabetes mellitus with diabetic nephropathy: Secondary | ICD-10-CM

## 2019-02-11 MED ORDER — LOSARTAN POTASSIUM 25 MG PO TABS: 25.0000 mg | ORAL_TABLET | Freq: Every day | ORAL | 1 refills | Status: DC

## 2019-02-11 MED ORDER — PANTOPRAZOLE SODIUM 40 MG PO TBEC: 40.0000 mg | DELAYED_RELEASE_TABLET | Freq: Every day | ORAL | 3 refills | Status: DC

## 2019-02-11 MED ORDER — ALPRAZOLAM 1 MG PO TABS: ORAL_TABLET | ORAL | 0 refills | Status: DC

## 2019-02-11 NOTE — Progress Notes (Addendum)
Virtual Visit via [email or web address]  This visit type was conducted due to national recommendations for restrictions regarding the COVID-19 pandemic (e.g. social distancing).  This format is felt to be most appropriate for this patient at this time.  All issues noted in this document were discussed and addressed.  No physical exam was performed (except for noted visual exam findings with Video Visits).   I connected with@ on 02/11/19 at  8:30 AM EDT by a video enabled telemedicine application  and verified that I am speaking with the correct person using two identifiers. Location patient: home Location provider: work or home office Persons participating in the virtual visit: patient, provider  I discussed the limitations, risks, security and privacy concerns of performing an evaluation and management service by telephone and the availability of in person appointments. I also discussed with the patient that there may be a patient responsible charge related to this service. The patient expressed understanding and agreed to proceed.  Reason for visit: follow up on T2DM with microalbuminuria  , HTN, hyperlipidemia  and fatty liver  HPI:  T2DM:  Last A1c  Oct 2019 6.9    Last eye exam jan 2020 not checking sugars  more than once a week.  He feels generally well, is exercising several times per week and checking blood sugars once or twice per week  at variable times.  BS have been under 130 fasting and < 150 post prandially.  Denies any recent hypoglyemic events.  Taking his medications as directed. Following a carbohydrate modified diet 6 days per week. [name] Appetite is good.   Right sided back pain with sciatica: has been present for 4 months .remote back surgery.  No foot drop,  Loss of bowel or bladder control.   Hypertension: patient checks blood pressure twice weekly at home.  Readings have been for the most part > 140/80 at rest . Patient is following a reduce salt diet most days and is taking medications as  prescribed   ROS: See pertinent positives and negatives per HPI.  Past Medical History:  Diagnosis Date  . Cataract   . Diabetes mellitus 2005  . Hyperlipidemia   . Hypertension   . Other chronic nonalcoholic liver disease     Past Surgical History:  Procedure Laterality Date  . SPINE SURGERY     microdiskectomy    Family History  Problem Relation Age of Onset  . Diabetes Mother   . Hypertension Mother   . Hypertension Father   . Heart disease Father     SOCIAL HX:  reports that he quit smoking about 18 years ago. His smoking use included e-cigarettes. He quit smokeless tobacco use about 6 years ago.  His smokeless tobacco use included snuff. He reports current alcohol use of about 5.0 standard drinks of alcohol per week. He reports that he does not use drugs.   Current Outpatient Medications:  .  ALPRAZolam (XANAX) 1 MG tablet, TAKE 1 TABLET BY MOUTH ONCE DAILY AS NEEDED FOR SLEEP, Disp: 30 tablet, Rfl: 0 .  atorvastatin (LIPITOR) 20 MG tablet, Take 1 tablet by mouth once daily, Disp: 90 tablet, Rfl: 0 .  glipiZIDE (GLUCOTROL) 10 MG tablet, TAKE 1 TABLET BY MOUTH TWICE DAILY BEFORE MEAL(S), Disp: 180 tablet, Rfl: 0 .  glucose blood test strip, Check sugar twice daily. One Touch Ultra strips. Dx: E11.65, Disp: 200 each, Rfl: 5 .  JARDIANCE 25 MG TABS tablet, Take 1 tablet by mouth once daily, Disp: 90 tablet, Rfl: 0 .  metFORMIN (GLUCOPHAGE) 1000 MG tablet, TAKE ONE TABLET BY MOUTH TWICE DAILY, Disp: 180 tablet, Rfl: 1 .  naproxen sodium (ANAPROX) 220 MG tablet, Take 220 mg by mouth 2 (two) times daily with a meal., Disp: , Rfl:  .  losartan (COZAAR) 25 MG tablet, Take 1 tablet (25 mg total) by mouth at bedtime., Disp: 90 tablet, Rfl: 1 .  pantoprazole (PROTONIX) 40 MG tablet, Take 1 tablet (40 mg total) by mouth daily., Disp: 30 tablet, Rfl: 3  EXAM:  VITALS per patient if applicable:  GENERAL: alert, oriented, appears well and in no acute distress  HEENT: atraumatic,  conjunttiva clear, no obvious abnormalities on inspection of external nose and ears  NECK: normal movements of the head and neck  LUNGS: on inspection no signs of respiratory distress, breathing rate appears normal, no obvious gross SOB, gasping or wheezing  CV: no obvious cyanosis  MS: moves all visible extremities without noticeable abnormality  PSYCH/NEURO: pleasant and cooperative, no obvious depression or anxiety, speech and thought processing grossly intact  ASSESSMENT AND PLAN:  Discussed the following assessment and plan:  Diabetes mellitus without complication ([facility]) Continue jardiance , lisinopril, atorvastatin and metformin.  Repeat labs show slight loss of control . He has mild proteinuria but is taking lisinopril   Lab Results  Component Value Date   HGBA1C 7.2 (H) 02/11/2019   Lab Results  Component Value Date   MICROALBUR 5.7 (H) 05/13/2018     Controlled type 2 diabetes mellitus with microalbuminuric diabetic nephropathy ([facility]) Slight loss of control due to dietary noncompliance. Continue  jardiance , metformin glipizide  And ARB   Lab Results  Component Value Date   HGBA1C 7.2 (H) 02/11/2019   Lab Results  Component Value Date   MICROALBUR 5.7 (H) 05/13/2018    Generalized anxiety disorder Managed with rare use of xamax. Did not tolerate Effexor trial. He has had to double the dose when used. The risks and benefits of benzodiazepine use were discussed with patient today including excessive sedation leading to respiratory depression,  impaired thinking/driving, and addiction.  Patient was advised to avoid concurrent use with alcohol, to use medication only as needed and not to share with others  .   Sciatica of right side associated with disorder of lumbar spine He has history of surgery in 2003.  Current pain has been  present for the last 4 month s.  His myelopathy .  He is using Aleve maximal dose.  He has no recent MRI or plain films, which have been  ordered   Hypertension Well controlled on current regimen. I am making a decision to change patient's ACE Inhibitor to an ARB  based on increased reports of  angioedema.  I also advised patient to to take it at night instead of morning,  as recent studies have shown a reduction in incidence of heart attacks and strokes.     I discussed the assessment and treatment plan with the patient. The patient was provided an opportunity to ask questions and all were answered. The patient agreed with the plan and demonstrated an understanding of the instructions.   The patient was advised to call back or seek an in-person evaluation if the symptoms worsen or if the condition fails to improve as anticipated.  I provided 25 minutes of non-face-to-face time during this encounter.   [name], MD

## 2019-02-12 LAB — COMPREHENSIVE METABOLIC PANEL
ALT: 36 U/L (ref 0–53)
AST: 22 U/L (ref 0–37)
Albumin: 4.8 g/dL (ref 3.5–5.2)
Alkaline Phosphatase: 52 U/L (ref 39–117)
BUN: 20 mg/dL (ref 6–23)
CO2: 26 mEq/L (ref 19–32)
Calcium: 9.4 mg/dL (ref 8.4–10.5)
Chloride: 103 mEq/L (ref 96–112)
Creatinine, Ser: 0.8 mg/dL (ref 0.40–1.50)
GFR: 105.37 mL/min (ref >60.00)
Glucose, Bld: 115 mg/dL — ABNORMAL HIGH (ref 70–99)
Potassium: 4 mEq/L (ref 3.5–5.1)
Sodium: 138 mEq/L (ref 135–145)
Total Bilirubin: 0.3 mg/dL (ref 0.2–1.2)
Total Protein: 6.8 g/dL (ref 6.0–8.3)

## 2019-02-12 LAB — LIPID PANEL
Cholesterol: 169 mg/dL (ref 0–200)
HDL: 39.2 mg/dL (ref >39.00)
NonHDL: 130.2
Total CHOL/HDL Ratio: 4
Triglycerides: 291 mg/dL — ABNORMAL HIGH (ref 0.0–149.0)
VLDL: 58.2 mg/dL — ABNORMAL HIGH (ref 0.0–40.0)

## 2019-02-12 LAB — LDL CHOLESTEROL, DIRECT: Direct LDL: 100 mg/dL

## 2019-02-12 LAB — HEMOGLOBIN A1C: Hgb A1c MFr Bld: 7.2 % — ABNORMAL HIGH (ref 4.6–6.5)

## 2019-02-13 NOTE — Assessment & Plan Note (Signed)
Managed with rare use of xamax. Did not tolerate Effexor trial. He has had to double the dose when used. The risks and benefits of benzodiazepine use were discussed with patient today including excessive sedation leading to respiratory depression,  impaired thinking/driving, and addiction.  Patient was advised to avoid concurrent use with alcohol, to use medication only as needed and not to share with others  .

## 2019-02-13 NOTE — Assessment & Plan Note (Signed)
Slight loss of control due to dietary noncompliance. Continue  jardiance , metformin glipizide  And ARB   Lab Results  Component Value Date   HGBA1C 7.2 (H) 02/11/2019   Lab Results  Component Value Date   MICROALBUR 5.7 (H) 05/13/2018

## 2019-02-13 NOTE — Assessment & Plan Note (Signed)
Continue jardiance , lisinopril, atorvastatin and metformin.  Repeat labs show slight loss of control . He has mild proteinuria but is taking lisinopril   Lab Results  Component Value Date   HGBA1C 7.2 (H) 02/11/2019   Lab Results  Component Value Date   MICROALBUR 5.7 (H) 05/13/2018

## 2019-02-13 NOTE — Assessment & Plan Note (Signed)
He has history of surgery in 2003.  Current pain has been  present for the last 4 month s.  His myelopathy .  He is using Aleve maximal dose.  He has no recent MRI or plain films, which have been ordered

## 2019-02-13 NOTE — Assessment & Plan Note (Signed)
Well controlled on current regimen. I am making a decision to change patient's ACE Inhibitor to an ARB  based on increased reports of  angioedema.  I also advised patient to to take it at night instead of morning,  as recent studies have shown a reduction in incidence of heart attacks and strokes.

## 2019-02-15 ENCOUNTER — Ambulatory Visit (INDEPENDENT_AMBULATORY_CARE_PROVIDER_SITE_OTHER): Payer: 59 | Admitting: Internal Medicine

## 2019-02-15 ENCOUNTER — Other Ambulatory Visit: Payer: Self-pay

## 2019-02-15 ENCOUNTER — Encounter: Payer: Self-pay | Admitting: Internal Medicine

## 2019-02-15 DIAGNOSIS — R202 Paresthesia of skin: Secondary | ICD-10-CM

## 2019-02-15 DIAGNOSIS — K760 Fatty (change of) liver, not elsewhere classified: Secondary | ICD-10-CM

## 2019-02-15 DIAGNOSIS — E119 Type 2 diabetes mellitus without complications: Secondary | ICD-10-CM | POA: Diagnosis not present

## 2019-02-15 DIAGNOSIS — E785 Hyperlipidemia, unspecified: Secondary | ICD-10-CM

## 2019-02-15 DIAGNOSIS — E781 Pure hyperglyceridemia: Secondary | ICD-10-CM

## 2019-02-15 DIAGNOSIS — M5386 Other specified dorsopathies, lumbar region: Secondary | ICD-10-CM | POA: Diagnosis not present

## 2019-02-15 DIAGNOSIS — M795 Residual foreign body in soft tissue: Secondary | ICD-10-CM | POA: Diagnosis not present

## 2019-02-15 DIAGNOSIS — R200 Anesthesia of skin: Secondary | ICD-10-CM

## 2019-02-15 DIAGNOSIS — E1121 Type 2 diabetes mellitus with diabetic nephropathy: Secondary | ICD-10-CM

## 2019-02-15 MED ORDER — GABAPENTIN 100 MG PO CAPS: 100.0000 mg | ORAL_CAPSULE | Freq: Three times a day (TID) | ORAL | 3 refills | Status: DC

## 2019-02-15 MED ORDER — PREDNISONE 10 MG PO TABS: ORAL_TABLET | ORAL | 0 refills | Status: DC

## 2019-02-15 MED ORDER — CEPHALEXIN 500 MG PO CAPS: 500.0000 mg | ORAL_CAPSULE | Freq: Four times a day (QID) | ORAL | 0 refills | Status: DC

## 2019-02-15 NOTE — Progress Notes (Signed)
Virtual Visit via [email or web address]  This visit type was conducted due to national recommendations for restrictions regarding the COVID-19 pandemic (e.g. social distancing).  This format is felt to be most appropriate for this patient at this time.  All issues noted in this document were discussed and addressed.  No physical exam was performed (except for noted visual exam findings with Video Visits).   I connected with@ on 02/15/19 at  9:30 AM EDT by a video enabled telemedicine application or telephone and verified that I am speaking with the correct person using two identifiers. Location patient: home Location provider: work or home office Persons participating in the virtual visit: patient, provider  I discussed the limitations, risks, security and privacy concerns of performing an evaluation and management service by telephone and the availability of in person appointments. I also discussed with the patient that there may be a patient responsible charge related to this service. The patient expressed understanding and agreed to proceed.  Reason for visit: 1) follow up on back pain 2) unresolved superficial wound right cheekbone   HPI:   1) Seen last week for follow up on type 2 DM etc.  Reported a 4 month history of back pain with 2 months of right sided sciatica and intermittent numbness of the entire right leg and foot  for the last month, brought on by position change,  Walking,  Bending over.  History of lumbar spine surgery in 2003/   2) patient thinks he has a retained foreign body (copper sliver likely ) in his right cheek , on the zygoma,  since he sustained a wound at home in June when he was cutting a copper pipe. He as not seen by me but states that a sliver of copper sheared off and struck him near his right eye.  He was wearing safety glasses.   Had a 0.75 since the laceration right zygoma,  Went to urgent care.  Wound was not explored.  The wound has closed naturally except for one end  which has remained open and draining.  His wife noticed a piece of metal protruding from the open edge that could not  be pulled out with a tweezer .   No records are available from urgent care visit     ROS: See pertinent positives and negatives per HPI.  Past Medical History:  Diagnosis Date  . Cataract   . Diabetes mellitus 2005  . Hyperlipidemia   . Hypertension   . Other chronic nonalcoholic liver disease     Past Surgical History:  Procedure Laterality Date  . SPINE SURGERY     microdiskectomy    Family History  Problem Relation Age of Onset  . Diabetes Mother   . Hypertension Mother   . Hypertension Father   . Heart disease Father     SOCIAL HX:  reports that he quit smoking about 18 years ago. His smoking use included e-cigarettes. He quit smokeless tobacco use about 6 years ago.  His smokeless tobacco use included snuff. He reports current alcohol use of about 5.0 standard drinks of alcohol per week. He reports that he does not use drugs.   Current Outpatient Medications:  .  ALPRAZolam (XANAX) 1 MG tablet, TAKE 1 TABLET BY MOUTH ONCE DAILY AS NEEDED FOR SLEEP, Disp: 30 tablet, Rfl: 0 .  atorvastatin (LIPITOR) 20 MG tablet, Take 1 tablet by mouth once daily, Disp: 90 tablet, Rfl: 0 .  glipiZIDE (GLUCOTROL) 10 MG tablet, TAKE 1 TABLET BY MOUTH  TWICE DAILY BEFORE MEAL(S), Disp: 180 tablet, Rfl: 0 .  glucose blood test strip, Check sugar twice daily. One Touch Ultra strips. Dx: E11.65, Disp: 200 each, Rfl: 5 .  JARDIANCE 25 MG TABS tablet, Take 1 tablet by mouth once daily, Disp: 90 tablet, Rfl: 0 .  losartan (COZAAR) 25 MG tablet, Take 1 tablet (25 mg total) by mouth at bedtime., Disp: 90 tablet, Rfl: 1 .  metFORMIN (GLUCOPHAGE) 1000 MG tablet, TAKE ONE TABLET BY MOUTH TWICE DAILY, Disp: 180 tablet, Rfl: 1 .  naproxen sodium (ANAPROX) 220 MG tablet, Take 220 mg by mouth 2 (two) times daily with a meal., Disp: , Rfl:  .  pantoprazole (PROTONIX) 40 MG tablet, Take 1  tablet (40 mg total) by mouth daily., Disp: 30 tablet, Rfl: 3 .  cephALEXin (KEFLEX) 500 MG capsule, Take 1 capsule (500 mg total) by mouth 4 (four) times daily., Disp: 28 capsule, Rfl: 0 .  gabapentin (NEURONTIN) 100 MG capsule, Take 1 capsule (100 mg total) by mouth 3 (three) times daily., Disp: 90 capsule, Rfl: 3 .  predniSONE (DELTASONE) 10 MG tablet, 6 tablets on Day 1 , then reduce by 1 tablet daily until gone, Disp: 21 tablet, Rfl: 0  EXAM:  VITALS per patient if applicable:  GENERAL: alert, oriented, appears well and in no acute distress  HEENT: no obvious cellulitis of face, conjunctiva clear, no obvious abnormalities on inspection of external nose and ears  NECK: normal movements of the head and neck  LUNGS: on inspection no signs of respiratory distress, breathing rate appears normal, no obvious gross SOB, gasping or wheezing  CV: no obvious cyanosis  MS: moves all visible extremities without noticeable abnormality  PSYCH/NEURO: pleasant and cooperative, no obvious depression or anxiety, speech and thought processing grossly intact  ASSESSMENT AND PLAN:  Discussed the following assessment and plan:  Sciatica of right side associated with disorder of lumbar spine Plain films note disk space narrowing at L4 .  Symptoms are intermittent. Prednisone taper , gabapentin and PT  Referral  made.  If no improvement in 6 weeks,  Needs MRI lumbar spine and Neurosurgery referral   Numbness and tingling of foot Present since 2014,  Intermittent.  Prior workup for reversible causes negative.  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  NAFLD (nonalcoholic fatty liver disease) Managed with statin, low glycemic index  and control of diabetes,  Liver enzymes remain  Normal.    Lab Results  Component Value Date   ALT 36 02/11/2019   AST 22 02/11/2019   ALKPHOS 52 02/11/2019   BILITOT 0.3 02/11/2019    Hyperlipidemia with target LDL less than 70 LDL at goal on current medications  liver  enzymes are normal. No changes today. Triglycerides are elevated today to over 300.  Recommended a low glycemic index diet, weight  loss and regular exercise.  Advised t o  repeat in 6 months.   Lab Results  Component Value Date   CHOL 169 02/11/2019   HDL 39.20 02/11/2019   LDLCALC 86 11/12/2017   LDLDIRECT 100.0 02/11/2019   TRIG 291.0 (H) 02/11/2019   CHOLHDL 4 02/11/2019   Lab Results  Component Value Date   ALT 36 02/11/2019   AST 22 02/11/2019   ALKPHOS 52 02/11/2019   BILITOT 0.3 02/11/2019     Foreign body (FB) in soft tissue Likely a copper sliver from incident in June which unfortunately was mishandled by urgent Care (per patient , wound was not explored) .  This will require exploration with local anesthetic,  Since the wound has been open for over a month,  Starting cephalexin ahead of surgical consult for July 16 appt .  Last Td booster was done in 2015.    I discussed the assessment and treatment plan with the patient. The patient was provided an opportunity to ask questions and all were answered. The patient agreed with the plan and demonstrated an understanding of the instructions.   The patient was advised to call back or seek an in-person evaluation if the symptoms worsen or if the condition fails to improve as anticipated.  I provided 40 minutes of non-face-to-face time during this encounter.   [name], MD

## 2019-02-16 DIAGNOSIS — M795 Residual foreign body in soft tissue: Secondary | ICD-10-CM | POA: Insufficient documentation

## 2019-02-16 DIAGNOSIS — E781 Pure hyperglyceridemia: Secondary | ICD-10-CM | POA: Insufficient documentation

## 2019-02-16 NOTE — Assessment & Plan Note (Addendum)
Plain films note disk space narrowing at L4 .  Symptoms are intermittent. Prednisone taper , gabapentin and PT  Referral  made.  If no improvement in 6 weeks,  Needs MRI lumbar spine and Neurosurgery referral

## 2019-02-16 NOTE — Assessment & Plan Note (Signed)
Managed with statin, low glycemic index  and control of diabetes,  Liver enzymes remain  Normal.    Lab Results  Component Value Date   ALT 36 02/11/2019   AST 22 02/11/2019   ALKPHOS 52 02/11/2019   BILITOT 0.3 02/11/2019

## 2019-02-16 NOTE — Assessment & Plan Note (Signed)
Present since 2014,  Intermittent.  Prior workup for reversible causes negative.  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

## 2019-02-16 NOTE — Assessment & Plan Note (Signed)
LDL at goal on current medications  liver enzymes are normal. No changes today. Triglycerides are elevated today to over 300.  Recommended a low glycemic index diet, weight  loss and regular exercise.  Advised t o  repeat in 6 months.   Lab Results  Component Value Date   CHOL 169 02/11/2019   HDL 39.20 02/11/2019   LDLCALC 86 11/12/2017   LDLDIRECT 100.0 02/11/2019   TRIG 291.0 (H) 02/11/2019   CHOLHDL 4 02/11/2019   Lab Results  Component Value Date   ALT 36 02/11/2019   AST 22 02/11/2019   ALKPHOS 52 02/11/2019   BILITOT 0.3 02/11/2019

## 2019-02-16 NOTE — Assessment & Plan Note (Addendum)
Likely a copper sliver from incident in June which unfortunately was mishandled by urgent Care (per patient , wound was not explored) .  This will require exploration with local anesthetic,  Since the wound has been open for over a month,  Starting cephalexin ahead of surgical consult for July 16 appt .  Last Td booster was done in 2015.

## 2019-02-17 ENCOUNTER — Telehealth: Payer: Self-pay

## 2019-02-17 NOTE — Telephone Encounter (Signed)
LMTCB. Need to schedule pt for fasting labs in 3 months and an OV with Dr. Derrel Nip in a few days later.

## 2019-02-17 NOTE — Telephone Encounter (Signed)
Pt returned office call and labs and follow up appt has been scheduled.

## 2019-02-18 ENCOUNTER — Ambulatory Visit
Admission: RE | Admit: 2019-02-18 | Discharge: 2019-02-18 | Disposition: A | Discharge status: Home / Self Care | Payer: 59 | Source: Ambulatory Visit | Attending: General Surgery | Admitting: General Surgery

## 2019-02-18 ENCOUNTER — Encounter: Payer: Self-pay | Admitting: General Surgery

## 2019-02-18 ENCOUNTER — Other Ambulatory Visit: Payer: Self-pay

## 2019-02-18 ENCOUNTER — Ambulatory Visit: Payer: 59 | Admitting: General Surgery

## 2019-02-18 VITALS — BP 144/81 | HR 90 | Temp 97.3°F | Resp 14 | Ht 70.0 in | Wt 171.0 lb

## 2019-02-18 DIAGNOSIS — M795 Residual foreign body in soft tissue: Secondary | ICD-10-CM

## 2019-02-18 NOTE — Progress Notes (Unsigned)
Patient ID: Andrew Molina, male   DOB: 11-Dec-1975, 43 y.o.   MRN: [id number]  Chief Complaint  Patient presents with  . New Patient (Initial Visit)    HPI Andrew Molina is a 43 y.o. male here for evaluation of foreign body in his cheek. This was from cutting copper pipe with an angel grinder. Was seen at Urgent care, where the wound was cleaned, but was not explored. He is currently on antibiotics. He reports the area is still open.  HPI  Past Medical History:  Diagnosis Date  . Cataract   . Diabetes mellitus 2005  . Hyperlipidemia   . Hypertension   . Other chronic nonalcoholic liver disease     Past Surgical History:  Procedure Laterality Date  . SPINE SURGERY     microdiskectomy    Family History  Problem Relation Age of Onset  . Diabetes Mother   . Hypertension Mother   . Hypertension Father   . Heart disease Father     Social History Social History   Tobacco Use  . Smoking status: Former Smoker    Types: E-cigarettes    Quit date: 06/17/2000    Years since quitting: 18.6  . Smokeless tobacco: Former Systems developer    Types: Snuff    Quit date: 01/04/2013  Substance Use Topics  . Alcohol use: Yes    Alcohol/week: 5.0 standard drinks    Types: 5 Cans of beer per week  . Drug use: No    Allergies  Allergen Reactions  . Prednisone     Jittery.  Insomnia.  . Venlafaxine Anxiety    Increased irritability    Current Outpatient Medications  Medication Sig Dispense Refill  . ALPRAZolam (XANAX) 1 MG tablet TAKE 1 TABLET BY MOUTH ONCE DAILY AS NEEDED FOR SLEEP 30 tablet 0  . atorvastatin (LIPITOR) 20 MG tablet Take 1 tablet by mouth once daily 90 tablet 0  . cephALEXin (KEFLEX) 500 MG capsule Take 1 capsule (500 mg total) by mouth 4 (four) times daily. 28 capsule 0  . gabapentin (NEURONTIN) 100 MG capsule Take 1 capsule (100 mg total) by mouth 3 (three) times daily. 90 capsule 3  . glipiZIDE (GLUCOTROL) 10 MG tablet TAKE 1 TABLET BY MOUTH TWICE DAILY BEFORE MEAL(S) 180  tablet 0  . glucose blood test strip Check sugar twice daily. One Touch Ultra strips. Dx: E11.65 200 each 5  . JARDIANCE 25 MG TABS tablet Take 1 tablet by mouth once daily 90 tablet 0  . losartan (COZAAR) 25 MG tablet Take 1 tablet (25 mg total) by mouth at bedtime. 90 tablet 1  . metFORMIN (GLUCOPHAGE) 1000 MG tablet TAKE ONE TABLET BY MOUTH TWICE DAILY 180 tablet 1  . naproxen sodium (ANAPROX) 220 MG tablet Take 220 mg by mouth 2 (two) times daily with a meal.    . pantoprazole (PROTONIX) 40 MG tablet Take 1 tablet (40 mg total) by mouth daily. 30 tablet 3  . predniSONE (DELTASONE) 10 MG tablet 6 tablets on Day 1 , then reduce by 1 tablet daily until gone 21 tablet 0   No current facility-administered medications for this visit.     Review of Systems Review of Systems  Constitutional: Negative.   Respiratory: Negative.   Cardiovascular: Negative.     Blood pressure (!) 144/81, pulse 90, temperature (!) 97.3 F (36.3 C), resp. rate 14, height 5' 10"  (1.778 m), weight 171 lb (77.6 kg), SpO2 99 %.  Physical Exam Physical Exam  Data  Reviewed ***  Assessment ***  Plan   HPI, Physical Exam, Assessment and Plan have been scribed under the direction and in the presence of Robert Bellow, MD  Concepcion Living, LPN   Concepcion Living M 02/18/2019, 8:33 AM

## 2019-02-18 NOTE — Patient Instructions (Signed)
You may shower. The steri strips will fall off in 1-2 weeks. Call is any signs of infection develop.  Follow up as needed.

## 2019-02-22 ENCOUNTER — Other Ambulatory Visit: Payer: Self-pay | Admitting: Internal Medicine

## 2019-03-08 ENCOUNTER — Ambulatory Visit: Payer: 59 | Attending: Internal Medicine

## 2019-03-08 ENCOUNTER — Other Ambulatory Visit: Payer: Self-pay

## 2019-03-08 DIAGNOSIS — M5441 Lumbago with sciatica, right side: Secondary | ICD-10-CM | POA: Diagnosis present

## 2019-03-08 DIAGNOSIS — R293 Abnormal posture: Secondary | ICD-10-CM

## 2019-03-08 NOTE — Therapy (Signed)
[facility] [address], [place], [address] Phone: [phone]   Fax:  [phone]  Physical Therapy Evaluation  Patient Details  Name: Andrew Molina MRN: [id number] Date of Birth: 1976/02/20 Referring Provider (PT): Deborra Medina, MD   Encounter Date: 03/08/2019  PT End of Session - 03/08/19 1659    Visit Number  1    Number of Visits  13    Date for PT Re-Evaluation  04/22/19    PT Start Time  1700    PT Stop Time  1812    PT Time Calculation (min)  72 min    Activity Tolerance  Patient tolerated treatment well    Behavior During Therapy  [facility] for tasks assessed/performed       Past Medical History:  Diagnosis Date  . Cataract   . Diabetes mellitus 2005  . Hyperlipidemia   . Hypertension   . Other chronic nonalcoholic liver disease     Past Surgical History:  Procedure Laterality Date  . SPINE SURGERY     microdiskectomy    There were no vitals filed for this visit.   Subjective Assessment - 03/08/19 1704    Subjective  R low back weakness and pain. Also has R LE numbness. R low back pain: 3/10 currently, 6/10 at worst for the past month.  R LE paresthesia 1/10 currently and  8/10 at worst for the past month.    Pertinent History  R sided sciatica. Symptoms are hit or miss per pt but pretty regular. Pain began March. Pain began in his R LE and sometimes in his L. Back feels a little bit weaker and has pain.  Has to walk around a lot at work and his pain wears him out.  Pt does a fair amount of walking on concrete. 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 Had back surgery (microdiscectomy) 17-18 years ago. Pt has 3 ruptured disks in his low back.  Pt feels tingling and numbness R LE (around the L5/S1/S2 dermatome distribution). Denies saddle anesthesia or loss of bowel or bladder control.  Has had PT before after is back surgery.    Patient Stated Goals  Be able  to walk normally, get rid of his pain and symptoms. Be able to spend time with his family after work without being fatigued from his back pain.    Currently in Pain?  Yes    Pain Score  3     Pain Location  Back    Pain Orientation  Right;Lower    Pain Type  Acute pain    Pain Onset  More than a month ago    Pain Frequency  Occasional    Aggravating Factors   walking (sometimes causes back pain but sometimes does not), bending down and moving stuff at work, then standing up.    Pain Relieving Factors  Sitting down, supine or prone position. Aleve and Tylenol         [facility] PT Assessment - 03/08/19 1721      Assessment   Medical Diagnosis  R sided sciatica    Referring Provider (PT)  Deborra Medina, MD    Onset Date/Surgical Date  10/04/18   Pain began March 2020. No specific date provided   Prior Therapy  Pt has had PT for back surgery about 17-18 years ago.       Precautions   Precaution Comments  hx  of back surgery      Restrictions   Other Position/Activity Restrictions  no known restrictions      Balance Screen   Has the patient fallen in the past 6 months  No    Has the patient had a decrease in activity level because of a fear of falling?   No    Is the patient reluctant to leave their home because of a fear of falling?   No      Prior Function   Vocation  Full time employment    Vocation Requirements  PLOF: better able to tolerate standing, performing standing tasks, lifting, pushing/pulling heavy items at work      Observation/Other Assessments   Observations  Repeated flexion (-) but R LE symptoms disappeared; (+) SLUMP L > R for sciatic tighness. Long sit test suggests anterior nutation L innominate.       Posture/Postural Control   Posture Comments  L lateral shift, B protracted shoulder L > R, R posterior pelvic rotation      AROM   Lumbar Flexion  WFL    Lumbar Extension  limited with R anterior thigh numbness    Lumbar - Right Side Bend  WFL    Lumbar - Left  Side Bend  WFL    Lumbar - Right Rotation  WFL with R LE symptoms    Lumbar - Left Rotation  WFL with decreased R LE symptoms.       PROM   Overall PROM Comments  supine with hip in 90/90: IR: R WFL, L limited, tight end feel       Strength   Right Hip Flexion  4+/5    Right Hip Extension  4-/5    Right Hip ABduction  5/5    Left Hip Flexion  4/5    Left Hip Extension  4/5    Left Hip ABduction  5/5    Right Knee Flexion  5/5    Right Knee Extension  5/5    Left Knee Flexion  5/5    Left Knee Extension  5/5      Palpation   Palpation comment  R lumbar paraspinal muscle tension.       Ambulation/Gait   Gait Comments  decreased trunk rotation.                 Objective measurements completed on examination: See above findings.     No latex band allergies Blood pressure controlled per pt No lumbar fusions per pt   Sitting posture: L lateral shift, L hip weight bearing.    Static standing: increased R LE symptoms at L4 dermatome.   Decreased R LE symptoms with movements to promote R intervertebral foraminal opening   Supine passive SLR hip flexion   L LE (sciatic tightness) which decreases with cervical flexion   R LE  (sciatic tightness). No change with cervical flexion    Medbridge Access Code: [email or web address]  Therapeutic exercise   Seated lumbar roll. Decreased R LE symptoms.   Reviewed ergonomic lifting.   L lateral shift correction manually. Increased R LE symptoms.    Supine manual R hip IR stretch with PT 1 minute x 3   Static standing with abdominal contraction, then B scapular retraction hold  Decreased R LE symptoms    Sitting transversus abdominis contraction and B scapular retraction 1 minute x 3   Reviewed and given as part of his HEP. Pt demonstrated and verbalized understanding. Handout provided.  Improved exercise technique, movement at target joints, use of target muscles after mod verbal, visual, tactile cues.   Reviewed plan of  care 2x/week for 6 weeks but try appointment by appointment first secondary to co-pay.   Patient is a 43 year old male who came to physical therapy secondary to low back pain with sciatica R > L. He also presents with altered posture, R lumbar paraspinal muscle tension, bilateral glute max weakness, reproduction of symptoms with movements that decrease R intervertebral foraminal opening, decreased trunk muscle use, limited L hip IR, and difficulty performing functional tasks such as moving items at work, walking, as well as tolerating the standing position secondary to symptoms. Pt will benefit from skilled physical therapy services to address the aforementioned deficits.      PT Education - 03/08/19 [place]    Education Details  ther-ex, HEP, plan of care    Person(s) Educated  Patient    Methods  Explanation;Demonstration;Tactile cues;Verbal cues;Handout    Comprehension  Verbalized understanding;Returned demonstration       PT Short Term Goals - 03/08/19 1835      PT SHORT TERM GOAL #1   Title  Patient will be independent with his HEP to improve trunk and hip strength, and promote ability to tolerate standing and work tasks more comfortably for his back and LE.    Baseline  Pt has started his HEP  (03/08/2019)    Time  3    Period  Weeks    Status  New    Target Date  04/01/19        PT Long Term Goals - 03/08/19 1836      PT LONG TERM GOAL #1   Title  Patient will have a decrease in low back pain to 3/10 or less at worst to promote ability to perform standing tasks as well as ability to lift and carry items at work.    Baseline  6/10 back pain at most for the past month (03/08/2019)    Time  6    Period  Weeks    Status  New    Target Date  04/22/19      PT LONG TERM GOAL #2   Title  Patient will have a decrease in R LE paresthesia to 2/10 or less at worst to promote ability to tolerate standing and performing work related tasks.    Baseline  8/10 R LE paresthesia at worst for  the past month (03/09/2019)    Time  6    Period  Weeks    Status  New    Target Date  04/22/19      PT LONG TERM GOAL #3   Title  Patient will improve bilateral glute max muscle strength by at least 1/2 MMT grade to promote ability to perform standing tasks with less back and LE pain.    Baseline  hip extension: 4-/5 R, 4/5 L (03/08/2019)    Time  6    Period  Weeks    Status  New    Target Date  04/22/19             Plan - 03/08/19 1829    Clinical Impression Statement  Patient is a 43 year old male who came to physical therapy secondary to low back pain with sciatica R > L. He also presents with altered posture, R lumbar paraspinal muscle tension, bilateral glute max weakness, reproduction of symptoms with movements that decrease R intervertebral foraminal opening, decreased  trunk muscle use, limited L hip IR, and difficulty performing functional tasks such as moving items at work, walking, as well as tolerating the standing position secondary to symptoms. Pt will benefit from skilled physical therapy services to address the aforementioned deficits.    Personal Factors and Comorbidities  Other;Comorbidity 1;Profession   high co-pay   Comorbidities  history of back surgery; job involves heavy lifting at times, walking on concrete floor    Examination-Activity Limitations  Lift;Carry;Stand    Stability/Clinical Decision Making  Evolving/Moderate complexity    Clinical Decision Making  Moderate    Rehab Potential  Fair   hi co-pay   PT Frequency  2x / week    PT Duration  6 weeks    PT Treatment/Interventions  Manual techniques;Therapeutic exercise;Therapeutic activities;Gait training;Functional mobility training;Neuromuscular re-education;Patient/family education;Dry needling;Spinal Manipulations;Joint Manipulations;Aquatic Therapy;Electrical Stimulation;Iontophoresis 79m/ml Dexamethasone;Moist Heat;Traction;Ultrasound   traction and manipulation if appropriate   PT Next Visit Plan   trunk, glute max, scapular strengthening, thoracic extension, manual techniques, modalities PRN    PT Home Exercise Plan  Medbridge Access Code: [email or web address]   Consulted and Agree with Plan of Care  Patient       Patient will benefit from skilled therapeutic intervention in order to improve the following deficits and impairments:  Pain, Postural dysfunction, Improper body mechanics, Difficulty walking  Visit Diagnosis: 1. Acute right-sided low back pain with right-sided sciatica   2. Abnormal posture        Problem List Patient Active Problem List   Diagnosis Date Noted  . Foreign body (FB) in soft tissue 02/16/2019  . Hypertriglyceridemia 02/16/2019  . Sciatica of right side associated with disorder of lumbar spine 02/11/2019  . Controlled type 2 diabetes mellitus with microalbuminuric diabetic nephropathy ([place] 02/11/2018  . Solitary pulmonary nodule 05/11/2016  . Nephrolithiasis 02/27/2016  . Numbness and tingling of foot 03/22/2013  . History of tobacco abuse 02/22/2013  . Generalized anxiety disorder 11/23/2012  . Diabetes mellitus without complication ([place] 140/03/6760 . Gastritis due to nonsteroidal anti-inflammatory drug 05/06/2012  . NAFLD (nonalcoholic fatty liver disease) 09/22/2011  . Hypertension   . Hyperlipidemia with target LDL less than [address]PT, DPT   03/08/2019, 6:59 PM  [facility] [address] [place] [id number]: [phone]  Fax:  [phone] Name: Andrew VASTINEMRN: [id number] of Birth: [birth year]

## 2019-03-08 NOTE — Patient Instructions (Signed)
Medbridge Access Code: [email or web address]  Seated transversus abdominis contraction with scapular retraction 1 min x 3 every hour.

## 2019-03-10 ENCOUNTER — Other Ambulatory Visit: Payer: Self-pay | Admitting: Internal Medicine

## 2019-03-10 ENCOUNTER — Ambulatory Visit: Payer: 59

## 2019-03-10 ENCOUNTER — Other Ambulatory Visit: Payer: Self-pay

## 2019-03-10 DIAGNOSIS — R293 Abnormal posture: Secondary | ICD-10-CM

## 2019-03-10 DIAGNOSIS — M5441 Lumbago with sciatica, right side: Secondary | ICD-10-CM | POA: Diagnosis not present

## 2019-03-10 NOTE — Patient Instructions (Addendum)
    Seated shoulder extension isometrics  Sitting on a chair    Press your hands on your thighs to feel your abdominal muscles contract.   Hold for 5 seconds comfortably.   Repeat 10 times.   Perform 3 sets daily.      Medbridge Access Code: [email or web address]  Seated diagonal chop to the R with double green band 10x5 seconds for 3 sets daily

## 2019-03-10 NOTE — Therapy (Signed)
[facility] [address], [place], [address] Phone: [phone]   Fax:  [phone]  Physical Therapy Treatment  Patient Details  Name: Andrew Molina MRN: [id number] Date of Birth: 29-Sep-1975 Referring Provider (PT): Deborra Medina, MD   Encounter Date: 03/10/2019  PT End of Session - 03/10/19 1747    Visit Number  2    Number of Visits  13    Date for PT Re-Evaluation  04/22/19    Authorization Type  2    Authorization Time Period  23 per calendar year    PT Start Time  1747    PT Stop Time  1855    PT Time Calculation (min)  68 min    Activity Tolerance  Patient tolerated treatment well    Behavior During Therapy  [facility] for tasks assessed/performed       Past Medical History:  Diagnosis Date  . Cataract   . Diabetes mellitus 2005  . Hyperlipidemia   . Hypertension   . Other chronic nonalcoholic liver disease     Past Surgical History:  Procedure Laterality Date  . SPINE SURGERY     microdiskectomy    There were no vitals filed for this visit.  Subjective Assessment - 03/10/19 1748    Subjective  Back is about like it has been. 3/10 back, 1-2/10 R LE paresthesia in sitting.    Pertinent History  R sided sciatica. Symptoms are hit or miss per pt but pretty regular. Pain began March. Pain began in his R LE and sometimes in his L. Back feels a little bit weaker and has pain.  Has to walk around a lot at work and his pain wears him out.  Pt does a fair amount of walking on concrete. 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 Had back surgery (microdiscectomy) 17-18 years ago. Pt has 3 ruptured disks in his low back.  Pt feels tingling and numbness R LE (around the L5/S1/S2 dermatome distribution). Denies saddle anesthesia or loss of bowel or bladder control.  Has had PT before after is back surgery.    Patient Stated Goals  Be able to walk normally, get rid of his pain  and symptoms. Be able to spend time with his family after work without being fatigued from his back pain.    Currently in Pain?  Yes    Pain Score  3     Pain Onset  More than a month ago                               PT Education - 03/10/19 1804    Education Details  ther-ex, HEP    Person(s) Educated  Patient    Methods  Explanation;Demonstration;Tactile cues;Verbal cues;Handout    Comprehension  Returned demonstration;Verbalized understanding         Objective   No latex band allergies Blood pressure controlled per pt No lumbar fusions per pt   Sitting posture: L lateral shift, L hip weight bearing.    Static standing: increased R LE symptoms at L4 dermatome.   Decreased R LE symptoms with movements to promote R intervertebral foraminal opening   Supine passive SLR hip flexion              L LE (sciatic tightness) which decreases with cervical flexion  R LE  (sciatic tightness). No change with cervical flexion    Medbridge Access Code: [email or web address]  Therapeutic exercise   Seated L hip extension isometrics   L 10x5 seconds for 3 sets  No R LE symptoms afterwards  Seated B shoulder extension isometrics, hands on thighs 10x3 with 5 second holds   Seated hip adduction pillow and glute max squeeze 10x3 with 5 second holds  No R LE symptoms and decreased low back pain to 1/10 with walking after aforementioned exercises.    Supine L hip extension isometrics in SKTC position 10x5 seconds for 3 sets  Prone position with 2 pillows under abdomen  prone glute max set   R 10x5 seconds    L 10x5 seconds for 2 sets   Decreased R lumbar paraspinal muscle tension palpated.   Prone L glute max extension 10x  Seated manually resisted R lateral shift isometrics in neutral to counter L lateral shift posture 10x5 seconds for 3 sets  Decreased R low back pain   Seated manually resisted trunk rotation  L rotation 10x5 seconds for 2  sets, then 4x5 seconds (R lateral thigh symptoms which disappeared with rest.    Decreased R lumbar paraspinal muscle tension palpated   Sitting up on R hip   Improved lumbar posture  Standing pallof press position  Manual PT resistance to R trunk rotation and B shoulder extension   No R LE symptoms but slight low back symptoms in standing    Standing PNF D2 extension chop double green band 2x5 seconds. R LE symptoms   Then in sitting 10x5 seconds for 3 sets  Gait around gym 1 lap. R LE symptoms.   Then with 10 lbs on R side for counterbalance for R lateral lean during R LE stance phase 3 laps. No symptoms.   Then gait x 3 laps with 10 lbs R side after sitting rest  R LE tightness along L5 dermatome    Work on glute med strength in standing next visit if appropriate    Improved exercise technique, movement at target joints, use of target muscles after mod verbal, visual, tactile cues.     Response to treatment Good muscle use felt with exercises. Decreased R lumbar paraspinal muscle tension palpated with resistance to R trunk rotation and trunk flexion in standing pallof press position. Decreased low back pain with L lateral shift correction using trunk muscles. No R LE symptoms with activation of L glute max, trunk, and hip adductor muscles in sitting.     Clinical impression Pt demonstrates R lumbar paraspinal muscle tension, L lateral shift and L glute max weakness. Decreased back pain with treatment to promote trunk (abdominals and obliques) and L glute max strengthening to help decrease muscle tension as well as trunk strengthening to improve posture. Symptoms returned with standing and walking, especially during R LE stance phase when his trunk would demonstrate R lateral lean to compensate for decreased R glute med activation. Symptoms decreased with use of counterbalance to decrease the aforementioned compensation. Pt will benefit from continued skilled physical therapy  services to decrease pain and symptoms, improve trunk and LE strength, function, and ability to ambulate with less symptoms.       PT Short Term Goals - 03/08/19 1835      PT SHORT TERM GOAL #1   Title  Patient will be independent with his HEP to improve trunk and hip strength, and promote ability to tolerate standing and work tasks more comfortably for  his back and LE.    Baseline  Pt has started his HEP  (03/08/2019)    Time  3    Period  Weeks    Status  New    Target Date  04/01/19        PT Long Term Goals - 03/08/19 1836      PT LONG TERM GOAL #1   Title  Patient will have a decrease in low back pain to 3/10 or less at worst to promote ability to perform standing tasks as well as ability to lift and carry items at work.    Baseline  6/10 back pain at most for the past month (03/08/2019)    Time  6    Period  Weeks    Status  New    Target Date  04/22/19      PT LONG TERM GOAL #2   Title  Patient will have a decrease in R LE paresthesia to 2/10 or less at worst to promote ability to tolerate standing and performing work related tasks.    Baseline  8/10 R LE paresthesia at worst for the past month (03/09/2019)    Time  6    Period  Weeks    Status  New    Target Date  04/22/19      PT LONG TERM GOAL #3   Title  Patient will improve bilateral glute max muscle strength by at least 1/2 MMT grade to promote ability to perform standing tasks with less back and LE pain.    Baseline  hip extension: 4-/5 R, 4/5 L (03/08/2019)    Time  6    Period  Weeks    Status  New    Target Date  04/22/19            Plan - 03/10/19 1744    Clinical Impression Statement  Pt demonstrates R lumbar paraspinal muscle tension, L lateral shift and L glute max weakness. Decreased back pain with treatment to promote trunk (abdominals and obliques) and L glute max strengthening to help decrease muscle tension as well as trunk strengthening to improve posture. Symptoms returned with standing and  walking, especially during R LE stance phase when his trunk would demonstrate R lateral lean to compensate for decreased R glute med activation. Symptoms decreased with use of counterbalance to decrease the aforementioned compensation. Pt will benefit from continued skilled physical therapy services to decrease pain and symptoms, improve trunk and LE strength, function, and ability to ambulate with less symptoms.    Personal Factors and Comorbidities  Other;Comorbidity 1;Profession   high co-pay   Comorbidities  history of back surgery; job involves heavy lifting at times, walking on concrete floor    Examination-Activity Limitations  Lift;Carry;Stand    Stability/Clinical Decision Making  Evolving/Moderate complexity    Rehab Potential  Fair   hi co-pay   PT Frequency  2x / week    PT Duration  6 weeks    PT Treatment/Interventions  Manual techniques;Therapeutic exercise;Therapeutic activities;Gait training;Functional mobility training;Neuromuscular re-education;Patient/family education;Dry needling;Spinal Manipulations;Joint Manipulations;Aquatic Therapy;Electrical Stimulation;Iontophoresis 69m/ml Dexamethasone;Moist Heat;Traction;Ultrasound   traction and manipulation if appropriate   PT Next Visit Plan  trunk, glute max, scapular strengthening, thoracic extension, manual techniques, modalities PRN    PT Home Exercise Plan  Medbridge Access Code: [email or web address]   Consulted and Agree with Plan of Care  Patient       Patient will benefit from skilled therapeutic intervention in order to improve the following  deficits and impairments:  Pain, Postural dysfunction, Improper body mechanics, Difficulty walking  Visit Diagnosis: 1. Acute right-sided low back pain with right-sided sciatica   2. Abnormal posture        Problem List Patient Active Problem List   Diagnosis Date Noted  . Foreign body (FB) in soft tissue 02/16/2019  . Hypertriglyceridemia 02/16/2019  . Sciatica of right side  associated with disorder of lumbar spine 02/11/2019  . Controlled type 2 diabetes mellitus with microalbuminuric diabetic nephropathy ([place]) 02/11/2018  . Solitary pulmonary nodule 05/11/2016  . Nephrolithiasis 02/27/2016  . Numbness and tingling of foot 03/22/2013  . History of tobacco abuse 02/22/2013  . Generalized anxiety disorder 11/23/2012  . Diabetes mellitus without complication ([place]) 55/73/2202  . Gastritis due to nonsteroidal anti-inflammatory drug 05/06/2012  . NAFLD (nonalcoholic fatty liver disease) 09/22/2011  . Hypertension   . Hyperlipidemia with target LDL less than [address] PT, DPT   03/10/2019, 7:19 PM  [facility] [address] [place], [address] Phone: [phone]   Fax:  [phone]  Name: Andrew Molina MRN: [id number] Date of Birth: Sep 19, 1975

## 2019-03-15 ENCOUNTER — Ambulatory Visit: Payer: 59

## 2019-03-17 ENCOUNTER — Other Ambulatory Visit: Payer: Self-pay

## 2019-03-17 ENCOUNTER — Ambulatory Visit: Payer: 59

## 2019-03-17 DIAGNOSIS — R293 Abnormal posture: Secondary | ICD-10-CM

## 2019-03-17 DIAGNOSIS — M5441 Lumbago with sciatica, right side: Secondary | ICD-10-CM | POA: Diagnosis not present

## 2019-03-17 NOTE — Patient Instructions (Addendum)
Medbridge Access Code: [email or web address]  Sidelying Hip Abduction  10x3 each LE  Supine leg press 10x5 seconds for 3 sets each LE   Bridge 10x3 with 5 seconds

## 2019-03-17 NOTE — Therapy (Signed)
[facility] [address] [place], [address] Phone: [phone]   Fax:  [phone]  Physical Therapy Treatment  Patient Details  Name: Andrew Molina MRN: [id number] Date of Birth: Jun 09, 1976 Referring Provider (PT): Deborra Medina, MD   Encounter Date: 03/17/2019  PT End of Session - 03/17/19 1749    Visit Number  3    Number of Visits  13    Date for PT Re-Evaluation  04/22/19    Authorization Type  3    Authorization Time Period  23 per calendar year    PT Start Time  1749    PT Stop Time  1845    PT Time Calculation (min)  56 min    Activity Tolerance  Patient tolerated treatment well    Behavior During Therapy  [facility] for tasks assessed/performed       Past Medical History:  Diagnosis Date  . Cataract   . Diabetes mellitus 2005  . Hyperlipidemia   . Hypertension   . Other chronic nonalcoholic liver disease     Past Surgical History:  Procedure Laterality Date  . SPINE SURGERY     microdiskectomy    There were no vitals filed for this visit.  Subjective Assessment - 03/17/19 1750    Subjective  Back and R LE is about the same. 1/10 back pain, R LE 2/10 currently    Pertinent History  R sided sciatica. Symptoms are hit or miss per pt but pretty regular. Pain began March. Pain began in his R LE and sometimes in his L. Back feels a little bit weaker and has pain.  Has to walk around a lot at work and his pain wears him out.  Pt does a fair amount of walking on concrete. 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 Had back surgery (microdiscectomy) 17-18 years ago. Pt has 3 ruptured disks in his low back.  Pt feels tingling and numbness R LE (around the L5/S1/S2 dermatome distribution). Denies saddle anesthesia or loss of bowel or bladder control.  Has had PT before after is back surgery.    Patient Stated Goals  Be able to walk normally, get rid of his pain and  symptoms. Be able to spend time with his family after work without being fatigued from his back pain.    Currently in Pain?  Yes    Pain Score  2    R LE   Pain Onset  More than a month ago                               PT Education - 03/17/19 1852    Education Details  ther-ex, HEP    Person(s) Educated  Patient    Methods  Explanation;Demonstration;Tactile cues;Verbal cues;Handout    Comprehension  Returned demonstration;Verbalized understanding       Objective   No latex band allergies Blood pressure controlled per pt No lumbar fusions per pt   Sitting posture: L lateral shift, L hip weight bearing.   Static standing: increased R LE symptoms at L4 dermatome.   Decreased R LE symptoms with movements to promote R intervertebral foraminal opening   Supine passive SLR hip flexion  L LE (sciatic tightness) which decreases with cervical flexion  R LE (sciatic tightness). No change with cervical flexion    Medbridge Access Code: [email or web address]  Therapeutic exercise    Standing B shoulder extension isometrics to the R hands on high mat table 10x2 with 5 seconds. R LE numbness   Standing with B UE in pallof press position  Manually resisted L trunk rotation and shoulder extension 10x5 seconds gently for 3 sets Decreased R LE numbness  Standing B shoulder extension isometrics to the L gentle, hands on high mat table 10x3 with 5 seconds.   Eased R LE numbness   L shoulder adduction green band 10x3 with 5 seconds   No R LE numbness at the end. Decreased R lumbar paraspinal muscle tension palpated   Add to HEP next visit if appropriate.      Running man R LE 2x  R LE numbness. Eases with rest  Standing ankle DF/PF rockerboard 2 min  Good tolerance with small movements   hip abduction in S/L 10x3 each side  Supine Leg Press (resting leg in hooklying, working leg straight)  R LE 10x3 with 5 second holds   L  LE 10x3 with 5 second holds  Supine mini bridge with glute max squeeze and B shoulder extension isometrics to promote abdominal activation  10x, then 10x5 seconds for 2 sets    Improved exercise technique, movement at target joints, use of target muscles after mod verbal, visual, tactile cues.     Response to treatment No back or R LE symptoms at end of session.     Clinical impression Decreased standing symptoms with treatment to decrease L trunk convexity (L shoulder adduction resisted green band) as well as decreasing R lumbar paraspinal muscle tension and improving abdominal activation to decrease low back extension pressure. Also worked on bilateral glute med and max strengthening with addition to abdominal muscle activation is supine and S/L to promote lumbopelvic stability when performing tasks. No pain in back or R LE paresthesia after session at rest as well as after and during gait x 200 ft. Pt will benefit from continued skilled physical therapy services to decrease pain, improve posture, trunk and glute strength, and function.      PT Short Term Goals - 03/08/19 1835      PT SHORT TERM GOAL #1   Title  Patient will be independent with his HEP to improve trunk and hip strength, and promote ability to tolerate standing and work tasks more comfortably for his back and LE.    Baseline  Pt has started his HEP  (03/08/2019)    Time  3    Period  Weeks    Status  New    Target Date  04/01/19        PT Long Term Goals - 03/08/19 1836      PT LONG TERM GOAL #1   Title  Patient will have a decrease in low back pain to 3/10 or less at worst to promote ability to perform standing tasks as well as ability to lift and carry items at work.    Baseline  6/10 back pain at most for the past month (03/08/2019)    Time  6    Period  Weeks    Status  New    Target Date  04/22/19      PT LONG TERM GOAL #2   Title  Patient will have a decrease in R LE paresthesia to 2/10 or less  at worst to promote ability to tolerate standing and performing work related tasks.    Baseline  8/10 R LE paresthesia at worst for  the past month (03/09/2019)    Time  6    Period  Weeks    Status  New    Target Date  04/22/19      PT LONG TERM GOAL #3   Title  Patient will improve bilateral glute max muscle strength by at least 1/2 MMT grade to promote ability to perform standing tasks with less back and LE pain.    Baseline  hip extension: 4-/5 R, 4/5 L (03/08/2019)    Time  6    Period  Weeks    Status  New    Target Date  04/22/19            Plan - 03/17/19 1850    Clinical Impression Statement  Decreased standing symptoms with treatment to decrease L trunk convexity (L shoulder adduction resisted green band) as well as decreasing R lumbar paraspinal muscle tension and improving abdominal activation to decrease low back extension pressure. Also worked on bilateral glute med and max strengthening with addition to abdominal muscle activation is supine and S/L to promote lumbopelvic stability when performing tasks. No pain in back or R LE paresthesia after session at rest as well as after and during gait x 200 ft. Pt will benefit from continued skilled physical therapy services to decrease pain, improve posture, trunk and glute strength, and function.    Personal Factors and Comorbidities  Other;Comorbidity 1;Profession   high co-pay   Comorbidities  history of back surgery; job involves heavy lifting at times, walking on concrete floor    Examination-Activity Limitations  Lift;Carry;Stand    Stability/Clinical Decision Making  Evolving/Moderate complexity    Rehab Potential  Fair   hi co-pay   PT Frequency  2x / week    PT Duration  6 weeks    PT Treatment/Interventions  Manual techniques;Therapeutic exercise;Therapeutic activities;Gait training;Functional mobility training;Neuromuscular re-education;Patient/family education;Dry needling;Spinal Manipulations;Joint  Manipulations;Aquatic Therapy;Electrical Stimulation;Iontophoresis 56m/ml Dexamethasone;Moist Heat;Traction;Ultrasound   traction and manipulation if appropriate   PT Next Visit Plan  trunk, glute max, scapular strengthening, thoracic extension, manual techniques, modalities PRN    PT Home Exercise Plan  Medbridge Access Code: [email or web address]   Consulted and Agree with Plan of Care  Patient       Patient will benefit from skilled therapeutic intervention in order to improve the following deficits and impairments:  Pain, Postural dysfunction, Improper body mechanics, Difficulty walking  Visit Diagnosis: 1. Acute right-sided low back pain with right-sided sciatica   2. Abnormal posture        Problem List Patient Active Problem List   Diagnosis Date Noted  . Foreign body (FB) in soft tissue 02/16/2019  . Hypertriglyceridemia 02/16/2019  . Sciatica of right side associated with disorder of lumbar spine 02/11/2019  . Controlled type 2 diabetes mellitus with [place]albuminuric diabetic nephropathy ([place] 02/11/2018  . Solitary pulmonary nodule 05/11/2016  . Nephrolithiasis 02/27/2016  . Numbness and tingling of foot 03/22/2013  . History of tobacco abuse 02/22/2013  . Generalized anxiety disorder 11/23/2012  . Diabetes mellitus without complication ([place] 154/04/8118 . Gastritis due to nonsteroidal anti-inflammatory drug 05/06/2012  . NAFLD (nonalcoholic fatty liver disease) 09/22/2011  . Hypertension   . Hyperlipidemia with target LDL less than [address]PT, DPT   03/17/2019, 6:53 PM  [facility] [address] [place] [address]Phone: [phone]  Fax:  [phone] Name: Andrew STOFFERSMRN: [id number]  of Birth: [birth year]

## 2019-03-18 ENCOUNTER — Encounter: Payer: Self-pay | Admitting: *Deleted

## 2019-03-22 ENCOUNTER — Other Ambulatory Visit: Payer: Self-pay

## 2019-03-22 ENCOUNTER — Ambulatory Visit: Payer: 59

## 2019-03-22 DIAGNOSIS — M5441 Lumbago with sciatica, right side: Secondary | ICD-10-CM

## 2019-03-22 DIAGNOSIS — R293 Abnormal posture: Secondary | ICD-10-CM

## 2019-03-22 NOTE — Therapy (Signed)
[facility] [address], [place], [address] Phone: [phone]   Fax:  [phone]  Physical Therapy Treatment  Patient Details  Name: Andrew Molina MRN: [id number] Date of Birth: 06/03/1976 Referring Provider (PT): Deborra Medina, MD   Encounter Date: 03/22/2019  PT End of Session - 03/22/19 1749    Visit Number  4    Number of Visits  13    Date for PT Re-Evaluation  04/22/19    Authorization Type  4    Authorization Time Period  23 per calendar year    PT Start Time  1746    PT Stop Time  1831    PT Time Calculation (min)  45 min    Activity Tolerance  Patient tolerated treatment well    Behavior During Therapy  [facility] for tasks assessed/performed       Past Medical History:  Diagnosis Date  . Cataract   . Diabetes mellitus 2005  . Hyperlipidemia   . Hypertension   . Other chronic nonalcoholic liver disease     Past Surgical History:  Procedure Laterality Date  . SPINE SURGERY     microdiskectomy    There were no vitals filed for this visit.  Subjective Assessment - 03/22/19 1748    Subjective  Patient reported no symptoms at start of session, said that its been a pretty decent day.    Pertinent History  R sided sciatica. Symptoms are hit or miss per pt but pretty regular. Pain began March. Pain began in his R LE and sometimes in his L. Back feels a little bit weaker and has pain.  Has to walk around a lot at work and his pain wears him out.  Pt does a fair amount of walking on concrete. 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 Had back surgery (microdiscectomy) 17-18 years ago. Pt has 3 ruptured disks in his low back.  Pt feels tingling and numbness R LE (around the L5/S1/S2 dermatome distribution). Denies saddle anesthesia or loss of bowel or bladder control.  Has had PT before after is back surgery.    Patient Stated Goals  Be able to walk normally, get rid  of his pain and symptoms. Be able to spend time with his family after work without being fatigued from his back pain.    Currently in Pain?  No/denies    Pain Onset  More than a month ago        TREATMENT: Assessment of directional preference  Lumbar flexion, extension, side bending, rotation x5 reps ea direction Seated lumbar stretching anteriorly, lateral flexion x5 reps x 5 second holds  Thoracic rotation with dowel x10 x3 second holds  Supine double knees to chest with therapy ball x12  Supine bridges with legs up on therapy ball 2x10, no reported increase in pain  Supine hamstring curls with therapy ball 2x5  childs pose 3x30secs   Cat and cow stretches x7 reps improved spinal mobility each time   hip abduction in S/L 10x3 each side  sidelying femoral nerve glides for LLE, 2x10, initially signficant increase in symptoms with DF/PF, decreased with reps      Improved exercise technique, movement at target joints, use of target muscles after mod verbal, visual, tactile cues.    Pt response/clinical impression: Patient flexion/extension biases assessed, pt reported increased symptoms/radicular symptoms with lumbar extension, no symptoms with flexion based exercises. Combination movements unprovocative.  PT Education - 03/22/19 1748    Education Details  therex technique/form    Person(s) Educated  Patient    Methods  Explanation;Demonstration;Tactile cues;Handout    Comprehension  Verbalized understanding;Returned demonstration;Verbal cues required;Tactile cues required       PT Short Term Goals - 03/08/19 1835      PT SHORT TERM GOAL #1   Title  Patient will be independent with his HEP to improve trunk and hip strength, and promote ability to tolerate standing and work tasks more comfortably for his back and LE.    Baseline  Pt has started his HEP  (03/08/2019)    Time  3    Period  Weeks    Status  New    Target Date  04/01/19        PT Long Term  Goals - 03/08/19 1836      PT LONG TERM GOAL #1   Title  Patient will have a decrease in low back pain to 3/10 or less at worst to promote ability to perform standing tasks as well as ability to lift and carry items at work.    Baseline  6/10 back pain at most for the past month (03/08/2019)    Time  6    Period  Weeks    Status  New    Target Date  04/22/19      PT LONG TERM GOAL #2   Title  Patient will have a decrease in R LE paresthesia to 2/10 or less at worst to promote ability to tolerate standing and performing work related tasks.    Baseline  8/10 R LE paresthesia at worst for the past month (03/09/2019)    Time  6    Period  Weeks    Status  New    Target Date  04/22/19      PT LONG TERM GOAL #3   Title  Patient will improve bilateral glute max muscle strength by at least 1/2 MMT grade to promote ability to perform standing tasks with less back and LE pain.    Baseline  hip extension: 4-/5 R, 4/5 L (03/08/2019)    Time  6    Period  Weeks    Status  New    Target Date  04/22/19            Plan - 03/22/19 1749    Clinical Impression Statement  Patient with increased symptoms with back in an extended position throughout session. Improvement noted with rest/lumbar flexion or hip flexion. The patient responded well to strengthening exercises and remained motivated. Overall the patient would benefit from further skilled PT intervention to continue to progress program as able and address radicular symptoms.    Personal Factors and Comorbidities  Other;Comorbidity 1;Profession    Comorbidities  history of back surgery; job involves heavy lifting at times, walking on concrete floor    Examination-Activity Limitations  Lift;Carry;Stand    Rehab Potential  Fair   high copay   PT Frequency  2x / week    PT Duration  6 weeks    PT Treatment/Interventions  Manual techniques;Therapeutic exercise;Therapeutic activities;Gait training;Functional mobility training;Neuromuscular  re-education;Patient/family education;Dry needling;Spinal Manipulations;Joint Manipulations;Aquatic Therapy;Electrical Stimulation;Iontophoresis 81m/ml Dexamethasone;Moist Heat;Traction;Ultrasound    PT Next Visit Plan  trunk, glute max, scapular strengthening, thoracic extension, manual techniques, modalities PRN    PT Home Exercise Plan  Medbridge Access Code: [email or web address]   Consulted and Agree with Plan of Care  Patient  Patient will benefit from skilled therapeutic intervention in order to improve the following deficits and impairments:  Pain, Postural dysfunction, Improper body mechanics, Difficulty walking  Visit Diagnosis: 1. Acute right-sided low back pain with right-sided sciatica   2. Abnormal posture        Problem List Patient Active Problem List   Diagnosis Date Noted  . Foreign body (FB) in soft tissue 02/16/2019  . Hypertriglyceridemia 02/16/2019  . Sciatica of right side associated with disorder of lumbar spine 02/11/2019  . Controlled type 2 diabetes mellitus with microalbuminuric diabetic nephropathy ([place]) 02/11/2018  . Solitary pulmonary nodule 05/11/2016  . Nephrolithiasis 02/27/2016  . Numbness and tingling of foot 03/22/2013  . History of tobacco abuse 02/22/2013  . Generalized anxiety disorder 11/23/2012  . Diabetes mellitus without complication ([place]) 76/19/5093  . Gastritis due to nonsteroidal anti-inflammatory drug 05/06/2012  . NAFLD (nonalcoholic fatty liver disease) 09/22/2011  . Hypertension   . Hyperlipidemia with target LDL less than [address]     Lieutenant Diego PT, [place] 6:39 PM,03/22/19 [facility] [address], [place], [address] Phone: [phone]   Fax:  [phone]  Name: DEVONTRE SIEDSCHLAG MRN: [id number] Date of Birth: [birth year]

## 2019-03-24 ENCOUNTER — Other Ambulatory Visit: Payer: Self-pay

## 2019-03-24 ENCOUNTER — Ambulatory Visit: Payer: 59

## 2019-03-24 DIAGNOSIS — R293 Abnormal posture: Secondary | ICD-10-CM

## 2019-03-24 DIAGNOSIS — M5441 Lumbago with sciatica, right side: Secondary | ICD-10-CM

## 2019-03-24 NOTE — Patient Instructions (Signed)
Access Code: [email or web address]  URL: [email or web address]  Date: 03/24/2019  Prepared by: Joneen Boers   Exercises Seated Transversus Abdominis Bracing - 3 reps - 1 sets - 1 minute hold - 1x daily - 7x weekly Seated Diagonal Chop - 10 reps - 3 sets - 5 seconds hold - 1x daily - 7x weekly Sidelying Hip Abduction - 10 reps - 3 sets - 1x daily - 7x weekly Supine Leg Press - 10 reps - 3 sets - 5 seconds hold - 1x daily - 7x weekly Supine Bridge - 10 reps - 3 sets - 5 seconds hold - 1x daily - 7x weekly Hooklying Shoulder T - 10 reps - 3 sets - 5 seconds hold - 2x daily                            - 7x weekly

## 2019-03-24 NOTE — Therapy (Signed)
[facility] [address] [place], [address] Phone: [phone]   Fax:  [phone]  Physical Therapy Treatment  Patient Details  Name: Andrew Molina MRN: [id number] Date of Birth: September 15, 1975 Referring Provider (PT): Deborra Medina, MD   Encounter Date: 03/24/2019  PT End of Session - 03/24/19 1747    Visit Number  5    Number of Visits  13    Date for PT Re-Evaluation  04/22/19    Authorization Type  5    Authorization Time Period  23 per calendar year    PT Start Time  1747    PT Stop Time  1854    PT Time Calculation (min)  67 min    Activity Tolerance  Patient tolerated treatment well    Behavior During Therapy  [facility] for tasks assessed/performed       Past Medical History:  Diagnosis Date  . Cataract   . Diabetes mellitus 2005  . Hyperlipidemia   . Hypertension   . Other chronic nonalcoholic liver disease     Past Surgical History:  Procedure Laterality Date  . SPINE SURGERY     microdiskectomy    There were no vitals filed for this visit.  Subjective Assessment - 03/24/19 1748    Subjective  Had a little issue with his R LE when he walked about 20 ft from the waiting room to treatment room. Standing up after sitting for a while increases his R LE symptoms. 2/10 back soreness currently. Had symptoms after last session and yesterday. Sitting for a few minutes and standing up afterwards usually increases his R LE symptoms, but inconsistent.    Pertinent History  R sided sciatica. Symptoms are hit or miss per pt but pretty regular. Pain began March. Pain began in his R LE and sometimes in his L. Back feels a little bit weaker and has pain.  Has to walk around a lot at work and his pain wears him out.  Pt does a fair amount of walking on concrete. 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 Had back surgery (microdiscectomy) 17-18 years ago. Pt has 3 ruptured disks in  his low back.  Pt feels tingling and numbness R LE (around the L5/S1/S2 dermatome distribution). Denies saddle anesthesia or loss of bowel or bladder control.  Has had PT before after is back surgery.    Patient Stated Goals  Be able to walk normally, get rid of his pain and symptoms. Be able to spend time with his family after work without being fatigued from his back pain.    Currently in Pain?  Yes    Pain Score  2     Pain Onset  More than a month ago                               PT Education - 03/24/19 1814    Education Details  ther-ex    Person(s) Educated  Patient    Methods  Explanation;Demonstration;Tactile cues;Verbal cues    Comprehension  Returned demonstration;Verbalized understanding        Objective   No latex band allergies Blood pressure controlled per pt No lumbar fusions per pt   Sitting posture: L lateral shift, L hip weight bearing.   Static standing: increased R LE symptoms at L4 dermatome.   Decreased R LE symptoms  with movements to promote R intervertebral foraminal opening   Supine passive SLR hip flexion  L LE (sciatic tightness) which decreases with cervical flexion  R LE (sciatic tightness). No change with cervical flexion    Pt states that his back surgery was a microdiscectomy. No hardware or fusion in back.     Medbridge Access Code: [email or web address]  Therapeutic exercise   Seated L trunk rotation 10x3  L shoulder adduction green band 10x3 with 5 seconds               Decreased R lumbar paraspinal muscle tension palpated  Slight decreased R LE symptoms in standing  shoulder extension green band .   L 10x3 with 5 second holds  R 5x. Increased R LE symptoms  Hook lying B shoulder flexion to promote thoracic extension and decrease lumbar extension pressure 10x5 seconds for 3 sets  Hook lying open books (B shoulder horizontal abduction) 10x3 with 5 second holds   Standing L trunk  side bend 10x5 seconds with transversus abdominis activation on the return motion  Decreased R LE symptoms on the return motion with addition of transversus abdominis activation   Seated manually resisted trunk flexion isometrics 10x5 seconds.    Improved exercise technique, movement at target joints, use of target muscles after mod verbal, visual, tactile cues.    Manual therapy  Prone R UPA pressure to L2 and L3 transverse process area produced very slight R sciatic symptoms. Disappears with release of pressure  Prone central P to A to mid thoracic spine grade 3 to promote mobility  Decreased symptoms during gait initially. Symptoms returned with prolonged ambulation.      Response to treatment Fair tolerance to today's session.     Clinical impression Worked on thoracic extension to decrease extension pressure to low back. Pt able to ambulate longer after treatment to promote thoracic mobility. Continued working on trunk strengthening to help decrease R lumbar paraspinal muscle tension and R LE symptoms. Fair tolerance to today's session. Symptoms continue to return with continued standing exercises. Added hooklying B shoulder horizontal abduction to promote thoracic extension as part of his HEP to help redistribute extension pressure throughout his spine. Pt will benefit from continued skilled physical therapy services to decrease back pain and R LE paresthesia, improve strength, and ability to perform standing tasks and ambulate more comfortably.      PT Short Term Goals - 03/08/19 1835      PT SHORT TERM GOAL #1   Title  Patient will be independent with his HEP to improve trunk and hip strength, and promote ability to tolerate standing and work tasks more comfortably for his back and LE.    Baseline  Pt has started his HEP  (03/08/2019)    Time  3    Period  Weeks    Status  New    Target Date  04/01/19        PT Long Term Goals - 03/08/19 1836      PT LONG TERM  GOAL #1   Title  Patient will have a decrease in low back pain to 3/10 or less at worst to promote ability to perform standing tasks as well as ability to lift and carry items at work.    Baseline  6/10 back pain at most for the past month (03/08/2019)    Time  6    Period  Weeks    Status  New    Target Date  04/22/19  PT LONG TERM GOAL #2   Title  Patient will have a decrease in R LE paresthesia to 2/10 or less at worst to promote ability to tolerate standing and performing work related tasks.    Baseline  8/10 R LE paresthesia at worst for the past month (03/09/2019)    Time  6    Period  Weeks    Status  New    Target Date  04/22/19      PT LONG TERM GOAL #3   Title  Patient will improve bilateral glute max muscle strength by at least 1/2 MMT grade to promote ability to perform standing tasks with less back and LE pain.    Baseline  hip extension: 4-/5 R, 4/5 L (03/08/2019)    Time  6    Period  Weeks    Status  New    Target Date  04/22/19            Plan - 03/24/19 1746    Clinical Impression Statement  Worked on thoracic extension to decrease extension pressure to low back. Pt able to ambulate longer after treatment to promote thoracic mobility. Continued working on trunk strengthening to help decrease R lumbar paraspinal muscle tension and R LE symptoms. Fair tolerance to today's session. Symptoms continue to return with continued standing exercises. Added hooklying B shoulder horizontal abduction to promote thoracic extension as part of his HEP to help redistribute extension pressure throughout his spine. Pt will benefit from continued skilled physical therapy services to decrease back pain and R LE paresthesia, improve strength, and ability to perform standing tasks and ambulate more comfortably.    Personal Factors and Comorbidities  Other;Comorbidity 1;Profession    Comorbidities  history of back surgery; job involves heavy lifting at times, walking on concrete floor     Examination-Activity Limitations  Lift;Carry;Stand    Rehab Potential  Fair   high copay   PT Frequency  2x / week    PT Duration  6 weeks    PT Treatment/Interventions  Manual techniques;Therapeutic exercise;Therapeutic activities;Gait training;Functional mobility training;Neuromuscular re-education;Patient/family education;Dry needling;Spinal Manipulations;Joint Manipulations;Aquatic Therapy;Electrical Stimulation;Iontophoresis 68m/ml Dexamethasone;Moist Heat;Traction;Ultrasound    PT Next Visit Plan  trunk, glute max, scapular strengthening, thoracic extension, manual techniques, modalities PRN    PT Home Exercise Plan  Medbridge Access Code: [email or web address]   Consulted and Agree with Plan of Care  Patient       Patient will benefit from skilled therapeutic intervention in order to improve the following deficits and impairments:  Pain, Postural dysfunction, Improper body mechanics, Difficulty walking  Visit Diagnosis: 1. Acute right-sided low back pain with right-sided sciatica   2. Abnormal posture        Problem List Patient Active Problem List   Diagnosis Date Noted  . Foreign body (FB) in soft tissue 02/16/2019  . Hypertriglyceridemia 02/16/2019  . Sciatica of right side associated with disorder of lumbar spine 02/11/2019  . Controlled type 2 diabetes mellitus with [place]albuminuric [place] 02/11/2018  . Solitary pulmonary nodule 05/11/2016  . Nephrolithiasis 02/27/2016  . Numbness and tingling of foot 03/22/2013  . History of tobacco abuse 02/22/2013  . Generalized anxiety disorder 11/23/2012  . [place] 131/54/0086 . Gastritis due to nonsteroidal anti-inflammatory drug 05/06/2012  . NAFLD (nonalcoholic fatty liver disease) 09/22/2011  . Hypertension   . Hyperlipidemia with target LDL less than [address]PT, DPT  03/24/2019, 7:09 PM  [facility] [address], [place], [address] Phone: [phone]   Fax:  [phone]  Name: Andrew Molina MRN: [id number] Date of Birth: 26-Dec-1975

## 2019-03-29 ENCOUNTER — Ambulatory Visit: Payer: 59

## 2019-03-29 ENCOUNTER — Other Ambulatory Visit: Payer: Self-pay

## 2019-03-29 DIAGNOSIS — M5441 Lumbago with sciatica, right side: Secondary | ICD-10-CM | POA: Diagnosis not present

## 2019-03-29 DIAGNOSIS — R293 Abnormal posture: Secondary | ICD-10-CM

## 2019-03-29 NOTE — Therapy (Signed)
[place] [facility] [address] [place], [address] Phone: [phone]   Fax:  [phone]  Physical Therapy Treatment  Patient Details  Name: Andrew Molina MRN: [id number] Date of Birth: 07-09-1976 Referring Provider (PT): Deborra Medina, MD   Encounter Date: 03/29/2019  PT End of Session - 03/29/19 1749    Visit Number  6    Number of Visits  13    Date for PT Re-Evaluation  04/22/19    Authorization Type  6    Authorization Time Period  23 per calendar year    PT Start Time  1748    PT Stop Time  1829    PT Time Calculation (min)  41 min    Activity Tolerance  Patient tolerated treatment well    Behavior During Therapy  [facility] for tasks assessed/performed       Past Medical History:  Diagnosis Date  . Cataract   . Diabetes mellitus 2005  . Hyperlipidemia   . Hypertension   . Other chronic nonalcoholic liver disease     Past Surgical History:  Procedure Laterality Date  . SPINE SURGERY     microdiskectomy    There were no vitals filed for this visit.  Subjective Assessment - 03/29/19 1752    Subjective  Patient stated that he does try to do his exercises at home, did them all on Saturday, but had a significant increase in pain on Saturday that may be due to the exercises. Reported that it was not only back pain that was worse but LE symptoms/numbness. He stated he did not do them on Sunday and it was a better day, less painful.    Pertinent History  R sided sciatica. Symptoms are hit or miss per pt but pretty regular. Pain began March. Pain began in his R LE and sometimes in his L. Back feels a little bit weaker and has pain.  Has to walk around a lot at work and his pain wears him out.  Pt does a fair amount of walking on concrete. 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 Had back surgery (microdiscectomy) 17-18 years ago. Pt has 3 ruptured disks in his low back.  Pt  feels tingling and numbness R LE (around the L5/S1/S2 dermatome distribution). Denies saddle anesthesia or loss of bowel or bladder control.  Has had PT before after is back surgery.    Patient Stated Goals  Be able to walk normally, get rid of his pain and symptoms. Be able to spend time with his family after work without being fatigued from his back pain.    Currently in Pain?  Yes    Pain Score  4     Pain Location  Back    Pain Orientation  Right;Lower    Pain Type  Acute pain    Pain Onset  More than a month ago       Objective   No latex band allergies Blood pressure controlled per pt No lumbar fusions per pt     Sitting posture: L lateral shift, L hip weight bearing.    Static standing: increased R LE symptoms at L4 dermatome.    Decreased R LE symptoms with movements to promote R intervertebral foraminal opening    Supine passive SLR hip flexion              L LE (sciatic tightness) which decreases with cervical  flexion              R LE  (sciatic tightness). No change with cervical flexion      Pt states that his back surgery was a microdiscectomy. No hardware or fusion in back.        Medbridge Access Code: [email or web address]   Therapeutic exercise     Seated resisted trunk rotation L and then R 10x5 ( PT resisted)     Hook lying open books (B shoulder horizontal abduction) 10x3 with 5 second holds     Prone prop on elbows x5 mins   Seated manually resisted trunk extension isometrics 10x5 seconds.      Improved exercise technique, movement at target joints, use of target muscles after mod verbal, visual, tactile cues.      Manual therapy   Prone central P to A to mid thoracic spine - L5 grade 3 to promote mobility. Pt reported decreased back pain once in prone. Intermittent trigger pointing to L lumbar/thoracic paraspinals.               pt response/clinical impression: Pt reported decreased lumbar pain after prone positioning/prone propping. Pt did express  frustration over condition and varying presentation. PT provided active listening and education as able. PT and pt discussed centralization and peripheralization of symptoms as well, and pros/cons of imaging. At end of session pt reported low back pain decreased from 4/10 to 0/10 in sitting, but did experience R leg numbness while walking.The patient would benefit from further skilled PT to continue to address and assess patient as able to improve functional abilities and continue pain management.      PT Education - 03/29/19 1749    Education Details  ther-ex    Person(s) Educated  Patient    Methods  Explanation;Demonstration;Tactile cues;Verbal cues    Comprehension  Verbalized understanding;Returned demonstration       PT Short Term Goals - 03/08/19 1835      PT SHORT TERM GOAL #1   Title  Patient will be independent with his HEP to improve trunk and hip strength, and promote ability to tolerate standing and work tasks more comfortably for his back and LE.    Baseline  Pt has started his HEP  (03/08/2019)    Time  3    Period  Weeks    Status  New    Target Date  04/01/19        PT Long Term Goals - 03/08/19 1836      PT LONG TERM GOAL #1   Title  Patient will have a decrease in low back pain to 3/10 or less at worst to promote ability to perform standing tasks as well as ability to lift and carry items at work.    Baseline  6/10 back pain at most for the past month (03/08/2019)    Time  6    Period  Weeks    Status  New    Target Date  04/22/19      PT LONG TERM GOAL #2   Title  Patient will have a decrease in R LE paresthesia to 2/10 or less at worst to promote ability to tolerate standing and performing work related tasks.    Baseline  8/10 R LE paresthesia at worst for the past month (03/09/2019)    Time  6    Period  Weeks    Status  New    Target Date  04/22/19  PT LONG TERM GOAL #3   Title  Patient will improve bilateral glute max muscle strength by at least 1/2  MMT grade to promote ability to perform standing tasks with less back and LE pain.    Baseline  hip extension: 4-/5 R, 4/5 L (03/08/2019)    Time  6    Period  Weeks    Status  New    Target Date  04/22/19            Plan - 03/29/19 1755    Personal Factors and Comorbidities  Other;Comorbidity 1;Profession    Comorbidities  history of back surgery; job involves heavy lifting at times, walking on concrete floor    Examination-Activity Limitations  Lift;Carry;Stand    Stability/Clinical Decision Making  Evolving/Moderate complexity    Rehab Potential  Fair    PT Frequency  2x / week    PT Duration  6 weeks    PT Treatment/Interventions  Manual techniques;Therapeutic exercise;Therapeutic activities;Gait training;Functional mobility training;Neuromuscular re-education;Patient/family education;Dry needling;Spinal Manipulations;Joint Manipulations;Aquatic Therapy;Electrical Stimulation;Iontophoresis 35m/ml Dexamethasone;Moist Heat;Traction;Ultrasound    PT Next Visit Plan  trunk, glute max, scapular strengthening, thoracic extension, manual techniques, modalities PRN    PT Home Exercise Plan  Medbridge Access Code: [email or web address]   Consulted and Agree with Plan of Care  Patient       Patient will benefit from skilled therapeutic intervention in order to improve the following deficits and impairments:  Pain, Postural dysfunction, Improper body mechanics, Difficulty walking  Visit Diagnosis: Acute right-sided low back pain with right-sided sciatica  Abnormal posture     Problem List Patient Active Problem List   Diagnosis Date Noted  . Foreign body (FB) in soft tissue 02/16/2019  . Hypertriglyceridemia 02/16/2019  . Sciatica of right side associated with disorder of lumbar spine 02/11/2019  . Controlled type 2 diabetes mellitus with microalbuminuric diabetic nephropathy ([place] 02/11/2018  . Solitary pulmonary nodule 05/11/2016  . Nephrolithiasis 02/27/2016  . Numbness and tingling of  foot 03/22/2013  . History of tobacco abuse 02/22/2013  . Generalized anxiety disorder 11/23/2012  . Diabetes mellitus without complication ([facility] 107/21/8288 . Gastritis due to nonsteroidal anti-inflammatory drug 05/06/2012  . NAFLD (nonalcoholic fatty liver disease) 09/22/2011  . Hypertension   . Hyperlipidemia with target LDL less than [address]    DLieutenant DiegoPT, DDelaware12:36 PM,03/30/19 3[facility] [address] [place] [address]Phone: [phone]  Fax:  [phone] Name: Andrew PAVONMRN: [id number] of Birth: [birth year]

## 2019-03-31 ENCOUNTER — Ambulatory Visit: Payer: 59

## 2019-03-31 MED ORDER — JARDIANCE 25 MG PO TABS: 25.0000 mg | ORAL_TABLET | Freq: Every day | ORAL | 1 refills | Status: DC

## 2019-04-05 ENCOUNTER — Other Ambulatory Visit: Payer: Self-pay

## 2019-04-05 ENCOUNTER — Ambulatory Visit: Payer: 59

## 2019-04-05 DIAGNOSIS — M5441 Lumbago with sciatica, right side: Secondary | ICD-10-CM

## 2019-04-05 DIAGNOSIS — R293 Abnormal posture: Secondary | ICD-10-CM

## 2019-04-05 NOTE — Therapy (Signed)
[place] [facility] [address], [place], [address] Phone: [phone]   Fax:  [phone]  Physical Therapy Treatment  Patient Details  Name: Andrew Molina MRN: [id number] Date of Birth: 1975-11-22 Referring Provider (PT): Deborra Medina, MD   Encounter Date: 04/05/2019  PT End of Session - 04/05/19 1753    Visit Number  7    Number of Visits  13    Date for PT Re-Evaluation  04/22/19    Authorization Type  7    Authorization Time Period  23 per calendar year    PT Start Time  1753    PT Stop Time  1907    PT Time Calculation (min)  74 min    Activity Tolerance  Patient tolerated treatment well    Behavior During Therapy  [facility] for tasks assessed/performed       Past Medical History:  Diagnosis Date  . Cataract   . Diabetes mellitus 2005  . Hyperlipidemia   . Hypertension   . Other chronic nonalcoholic liver disease     Past Surgical History:  Procedure Laterality Date  . SPINE SURGERY     microdiskectomy    There were no vitals filed for this visit.  Subjective Assessment - 04/05/19 1753    Subjective  Back is alright had a few spells. No pain or R LE symptoms. Was ok after last session. Not as active at work anymore because they hired a guy to move stuff around at work. 3/10 low back pain and 7/10 R LE symptoms at most for the past 7 days    Pertinent History  R sided sciatica. Symptoms are hit or miss per pt but pretty regular. Pain began March. Pain began in his R LE and sometimes in his L. Back feels a little bit weaker and has pain.  Has to walk around a lot at work and his pain wears him out.  Pt does a fair amount of walking on concrete. 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 Had back surgery (microdiscectomy) 17-18 years ago. Pt has 3 ruptured disks in his low back.  Pt feels tingling and numbness R LE (around the L5/S1/S2 dermatome distribution). Denies saddle  anesthesia or loss of bowel or bladder control.  Has had PT before after is back surgery.    Patient Stated Goals  Be able to walk normally, get rid of his pain and symptoms. Be able to spend time with his family after work without being fatigued from his back pain.    Currently in Pain?  No/denies    Pain Score  0-No pain    Pain Onset  More than a month ago         [facility] PT Assessment - 04/05/19 0001      Strength   Right Hip Flexion  5/5    Right Hip Extension  5/5    Left Hip Flexion  5/5    Left Hip Extension  5/5                           PT Education - 04/05/19 1822    Education Details  ther-ex    Person(s) Educated  Patient    Methods  Explanation;Demonstration;Tactile cues;Verbal cues    Comprehension  Returned demonstration;Verbalized understanding      Objective  No latex band allergies Blood pressure controlled per  pt No lumbar fusions per pt   Sitting posture: L lateral shift, L hip weight bearing.   Static standing: increased R LE symptoms at L4 dermatome.   Decreased R LE symptoms with movements to promote R intervertebral foraminal opening   Supine passive SLR hip flexion  L LE (sciatic tightness) which decreases with cervical flexion  R LE (sciatic tightness). No change with cervical flexion    Pt states that his back surgery was a microdiscectomy. No hardware or fusion in back.    Medbridge Access Code: [email or web address]  Manual therapy  prone STM R lumbar paraspinal muscles Prone STM R quadratus lumborum muscle Prone gentle manual R lumbar traction   Slight R ankle tingling which eases with rest.    Therapeutic exercise   Gait x 100 ft. R L4 symptoms to ankle     Supine LTR 10x3 to promote mobility  Supine B shoulder flexion to promote thoracic extension 10x3 with 5 second holds   Able to ambulate further with symptoms only radiating to R mid thigh (radiated to R foot prior to  mobility exercises)  Lower trunk rotation again 10x3   hooklying B shoulder flexion 10x3 with 5 second holds again  Seated press-ups 10x5 seconds for 2 sets  Standing R trunk side bend 10x  R L5 symptoms just past the knee  Standing L trunk side bend 10x. No symptoms  Then 10x more. R lateral thigh numbness  standing L side bend and L rotation. Increased symptoms  Standing trunk flexion. 10x3  Decreased R LE numbness in standing.   No R LE numbness when walking 100 ft lap  Prone manually resisted hip extension, hip abduction    Seated trunk flexion stretch 10x5 seconds for 2 sets    Discussed plan of care with pt: continue until 04/22/2019 and reassess    Improved exercise technique, movement at target joints, use of target muscles after mod verbal, visual, tactile cues.     Response to treatment Decreased R LE symptoms with trunk flexion based movements to open R intervertebral foraminae.    Clinical impression  Pt demonstrates significant improvement in glute max strength, and decreased low back pain since initial evaluation. R LE paresthesia level remain similar to baseline level. Pt able to ambulate 100 ft today with decreased R LE paresthesia after exercises to promote intervertebral foraminal opening, thoracic extension and trunk muscle activation, and posterior pelvic tilt, demonstrating flexion preference. Pt will benefit from continued skilled physical therapy services to decrease pain, improve strength, function, and decrease R LE paresthesia.        PT Short Term Goals - 03/08/19 1835      PT SHORT TERM GOAL #1   Title  Patient will be independent with his HEP to improve trunk and hip strength, and promote ability to tolerate standing and work tasks more comfortably for his back and LE.    Baseline  Pt has started his HEP  (03/08/2019)    Time  3    Period  Weeks    Status  New    Target Date  04/01/19        PT Long Term Goals - 04/05/19 1821       PT LONG TERM GOAL #1   Title  Patient will have a decrease in low back pain to 3/10 or less at worst to promote ability to perform standing tasks as well as ability to lift and carry items at work.    Baseline  6/10  back pain at most for the past month (03/08/2019); 3/10 back pain at most for the past 7 days (04/05/2019)    Time  6    Period  Weeks    Status  Partially Met    Target Date  04/22/19      PT LONG TERM GOAL #2   Title  Patient will have a decrease in R LE paresthesia to 2/10 or less at worst to promote ability to tolerate standing and performing work related tasks.    Baseline  8/10 R LE paresthesia at worst for the past month (03/09/2019); 7/10 R LE paresthesia at most for the past 7 days (04/05/2019)    Time  6    Period  Weeks    Status  Partially Met    Target Date  04/22/19      PT LONG TERM GOAL #3   Title  Patient will improve bilateral glute max muscle strength by at least 1/2 MMT grade to promote ability to perform standing tasks with less back and LE pain.    Baseline  hip extension: 4-/5 R, 4/5 L (03/08/2019)    Time  6    Period  Weeks    Status  New    Target Date  04/22/19            Plan - 04/05/19 1822    Clinical Impression Statement  Pt demonstrates significant improvement in glute max strength, and decreased low back pain since initial evaluation. R LE paresthesia level remain similar to baseline level. Pt able to ambulate 100 ft today with decreased R LE paresthesia after exercises to promote intervertebral foraminal opening, thoracic extension and trunk muscle activation, and posterior pelvic tilt, demonstrating flexion preference. Pt will benefit from continued skilled physical therapy services to decrease pain, improve strength, function, and decrease R LE paresthesia.    Personal Factors and Comorbidities  Other;Comorbidity 1;Profession    Comorbidities  history of back surgery; job involves heavy lifting at times, walking on concrete floor     Examination-Activity Limitations  Lift;Carry;Stand    Stability/Clinical Decision Making  Evolving/Moderate complexity    Rehab Potential  Fair    PT Frequency  2x / week    PT Duration  6 weeks    PT Treatment/Interventions  Manual techniques;Therapeutic exercise;Therapeutic activities;Gait training;Functional mobility training;Neuromuscular re-education;Patient/family education;Dry needling;Spinal Manipulations;Joint Manipulations;Aquatic Therapy;Electrical Stimulation;Iontophoresis 43m/ml Dexamethasone;Moist Heat;Traction;Ultrasound    PT Next Visit Plan  trunk, glute max, scapular strengthening, thoracic extension, manual techniques, modalities PRN    PT Home Exercise Plan  Medbridge Access Code: [email or web address]   Consulted and Agree with Plan of Care  Patient       Patient will benefit from skilled therapeutic intervention in order to improve the following deficits and impairments:  Pain, Postural dysfunction, Improper body mechanics, Difficulty walking  Visit Diagnosis: Acute right-sided low back pain with right-sided sciatica  Abnormal posture     Problem List Patient Active Problem List   Diagnosis Date Noted  . Foreign body (FB) in soft tissue 02/16/2019  . Hypertriglyceridemia 02/16/2019  . Sciatica of right side associated with disorder of lumbar spine 02/11/2019  . Controlled type 2 diabetes mellitus with microalbuminuric diabetic nephropathy ([place] 02/11/2018  . Solitary pulmonary nodule 05/11/2016  . Nephrolithiasis 02/27/2016  . Numbness and tingling of foot 03/22/2013  . History of tobacco abuse 02/22/2013  . Generalized anxiety disorder 11/23/2012  . Diabetes mellitus without complication ([facility] 187/56/4332 . Gastritis due to nonsteroidal anti-inflammatory drug 05/06/2012  .  NAFLD (nonalcoholic fatty liver disease) 09/22/2011  . Hypertension   . Hyperlipidemia with target LDL less than [address] PT, DPT   04/05/2019, 7:21 PM  [place] [facility] [address], [place], [address] Phone: [phone]   Fax:  [phone]  Name: ATTIKUS BARTOSZEK MRN: [id number] Date of Birth: [birth year]

## 2019-04-08 ENCOUNTER — Other Ambulatory Visit: Payer: Self-pay

## 2019-04-08 ENCOUNTER — Ambulatory Visit: Payer: BC Managed Care – PPO | Attending: Internal Medicine

## 2019-04-08 DIAGNOSIS — R293 Abnormal posture: Secondary | ICD-10-CM | POA: Diagnosis not present

## 2019-04-08 DIAGNOSIS — M5441 Lumbago with sciatica, right side: Secondary | ICD-10-CM | POA: Diagnosis not present

## 2019-04-08 NOTE — Patient Instructions (Signed)
Access Code: [email or web address]  URL: [email or web address]  Date: 04/08/2019  Prepared by: Joneen Boers   Exercises Seated Transversus Abdominis Bracing - 3 reps - 1 sets - 1 minute hold - 1x daily - 7x weekly Sidelying Hip Abduction - 10 reps - 1 sets - 3x daily - 7x weekly Supine Lower Trunk Rotation - 10 reps - 3 sets - 1x daily - 7x weekly Hooklying Shoulder T - 10 reps - 3 sets - 5 seconds hold - 2x daily                            - 7x weekly Hooklying Shoulder I - 10 reps - 3 sets - 5 seconds hold - 1x daily - 7x weekly Standing Forward Trunk Flexion - 10 reps - 3 sets - 1x daily - 7x weekly Supine Posterior Pelvic Tilt - 10 reps - 3 sets - 10 seconds hold - 1x daily - 7x weekly Curl Up with Arms Crossed - 10 reps - 3 sets - 5 seconds hold - 1x daily - 7x weekly

## 2019-04-08 NOTE — Therapy (Signed)
[facility] [address] [place], [address] Phone: [phone]   Fax:  [phone]  Physical Therapy Treatment  Patient Details  Name: Andrew Molina MRN: [id number] Date of Birth: Nov 07, 1975 Referring Provider (PT): Deborra Medina, MD   Encounter Date: 04/08/2019  PT End of Session - 04/08/19 1737    Visit Number  8    Number of Visits  13    Date for PT Re-Evaluation  04/22/19    Authorization Type  8    Authorization Time Period  23 per calendar year    PT Start Time  1737    PT Stop Time  1842    PT Time Calculation (min)  65 min    Activity Tolerance  Patient tolerated treatment well    Behavior During Therapy  [facility] for tasks assessed/performed       Past Medical History:  Diagnosis Date  . Cataract   . Diabetes mellitus 2005  . Hyperlipidemia   . Hypertension   . Other chronic nonalcoholic liver disease     Past Surgical History:  Procedure Laterality Date  . SPINE SURGERY     microdiskectomy    There were no vitals filed for this visit.  Subjective Assessment - 04/08/19 1738    Subjective  Back is not bad. The bend over stretch helps. Pain did not go past knee (L anterior thigh).    Pertinent History  R sided sciatica. Symptoms are hit or miss per pt but pretty regular. Pain began March. Pain began in his R LE and sometimes in his L. Back feels a little bit weaker and has pain.  Has to walk around a lot at work and his pain wears him out.  Pt does a fair amount of walking on concrete. 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 Had back surgery (microdiscectomy) 17-18 years ago. Pt has 3 ruptured disks in his low back.  Pt feels tingling and numbness R LE (around the L5/S1/S2 dermatome distribution). Denies saddle anesthesia or loss of bowel or bladder control.  Has had PT before after is back surgery.    Patient Stated Goals  Be able to walk normally, get rid  of his pain and symptoms. Be able to spend time with his family after work without being fatigued from his back pain.    Currently in Pain?  No/denies    Pain Score  0-No pain    Pain Onset  More than a month ago         [facility] PT Assessment - 04/08/19 0001      PROM   Overall PROM Comments  R hip extension: 0   femoral nerve tension resulting in numb ant thigh to foot   Left Hip Extension  9                           PT Education - 04/08/19 1752    Education Details  ther-ex, HEP    Person(s) Educated  Patient    Methods  Explanation;Demonstration;Tactile cues;Verbal cues;Handout    Comprehension  Returned demonstration;Verbalized understanding      Objective  No latex band allergies Blood pressure controlled per pt No lumbar fusions per pt   Sitting posture: L lateral shift, L hip weight bearing.   Static standing: increased R LE symptoms at L4 dermatome.   Decreased R LE symptoms  with movements to promote R intervertebral foraminal opening   Supine passive SLR hip flexion  L LE (sciatic tightness) which decreases with cervical flexion  R LE (sciatic tightness). No change with cervical flexion    Pt states that his back surgery was a microdiscectomy. No hardware or fusion in back.    Medbridge Access Code: [email or web address]  Manual therapy  Prone central P to A to mid and lower thoracic spine grade 3- to 3   Supine R hip IR stretch with PT 2x secondary to stiffness        Therapeutic exercise   Supine crunches 10x3 with 5 second holds    Supine posterior pelvic tilt   With alternate hip flexion/march 10x3 each LE  With B shoulder flexion green band 10x3 with 5 second holds   With SLR R hip flexion 10x3   Supine posterior pelvic tilt 10x2 with 10 second holds   Supine manually resisted R hip flexion 10x3  Increased R anterior thigh symptoms to knee   1/2 kneeling L hip flexor stretch 15  seconds x 5  For 2 sets    Good stretch felt anterior hip and thigh    Self kneeling posterior capsule stretch  R 5x15 seconds  L 5x15 seconds      Improved exercise technique, movement at target joints, use of target muscles after mod verbal, visual, tactile cues.     Response to treatment No decrease in symptoms with exercises. Symptoms however did not go pass the knee when walking today.    Clinical impression Pt continues to demonstrate flexion based preference for R LE symptoms. Also demonstrates R hip extension ROM more limited than L with femoral nerve symptoms (decreases with trunk flexion). Difficulty centralizing today's symptoms but pt overall did not have pain going past his R knee when walking today. Pt will benefit from continued skilled physical therapy service to decrease pain, improve strength and function.      PT Short Term Goals - 03/08/19 1835      PT SHORT TERM GOAL #1   Title  Patient will be independent with his HEP to improve trunk and hip strength, and promote ability to tolerate standing and work tasks more comfortably for his back and LE.    Baseline  Pt has started his HEP  (03/08/2019)    Time  3    Period  Weeks    Status  New    Target Date  04/01/19        PT Long Term Goals - 04/05/19 1821      PT LONG TERM GOAL #1   Title  Patient will have a decrease in low back pain to 3/10 or less at worst to promote ability to perform standing tasks as well as ability to lift and carry items at work.    Baseline  6/10 back pain at most for the past month (03/08/2019); 3/10 back pain at most for the past 7 days (04/05/2019)    Time  6    Period  Weeks    Status  Partially Met    Target Date  04/22/19      PT LONG TERM GOAL #2   Title  Patient will have a decrease in R LE paresthesia to 2/10 or less at worst to promote ability to tolerate standing and performing work related tasks.    Baseline  8/10 R LE paresthesia at worst for the past month  (03/09/2019); 7/10 R LE paresthesia at most  for the past 7 days (04/05/2019)    Time  6    Period  Weeks    Status  Partially Met    Target Date  04/22/19      PT LONG TERM GOAL #3   Title  Patient will improve bilateral glute max muscle strength by at least 1/2 MMT grade to promote ability to perform standing tasks with less back and LE pain.    Baseline  hip extension: 4-/5 R, 4/5 L (03/08/2019)    Time  6    Period  Weeks    Status  New    Target Date  04/22/19            Plan - 04/08/19 1852    Clinical Impression Statement  Pt continues to demonstrate flexion based preference for R LE symptoms. Also demonstrates R hip extension ROM more limited than L with femoral nerve symptoms (decreases with trunk flexion). Difficulty centralizing today's symptoms but pt overall did not have pain going past his R knee when walking today. Pt will benefit from continued skilled physical therapy service to decrease pain, improve strength and function.    Personal Factors and Comorbidities  Other;Comorbidity 1;Profession    Comorbidities  history of back surgery; job involves heavy lifting at times, walking on concrete floor    Examination-Activity Limitations  Lift;Carry;Stand    Stability/Clinical Decision Making  Evolving/Moderate complexity    Rehab Potential  Fair    PT Frequency  2x / week    PT Duration  6 weeks    PT Treatment/Interventions  Manual techniques;Therapeutic exercise;Therapeutic activities;Gait training;Functional mobility training;Neuromuscular re-education;Patient/family education;Dry needling;Spinal Manipulations;Joint Manipulations;Aquatic Therapy;Electrical Stimulation;Iontophoresis 103m/ml Dexamethasone;Moist Heat;Traction;Ultrasound    PT Next Visit Plan  trunk, glute max, scapular strengthening, thoracic extension, manual techniques, modalities PRN    PT Home Exercise Plan  Medbridge Access Code: [email or web address]   Consulted and Agree with Plan of Care  Patient       Patient  will benefit from skilled therapeutic intervention in order to improve the following deficits and impairments:  Pain, Postural dysfunction, Improper body mechanics, Difficulty walking  Visit Diagnosis: Acute right-sided low back pain with right-sided sciatica  Abnormal posture     Problem List Patient Active Problem List   Diagnosis Date Noted  . Foreign body (FB) in soft tissue 02/16/2019  . Hypertriglyceridemia 02/16/2019  . Sciatica of right side associated with disorder of lumbar spine 02/11/2019  . Controlled type 2 diabetes mellitus with microalbuminuric diabetic nephropathy ([place] 02/11/2018  . Solitary pulmonary nodule 05/11/2016  . Nephrolithiasis 02/27/2016  . Numbness and tingling of foot 03/22/2013  . History of tobacco abuse 02/22/2013  . Generalized anxiety disorder 11/23/2012  . Diabetes mellitus without complication ([place] 110/25/8527 . Gastritis due to nonsteroidal anti-inflammatory drug 05/06/2012  . NAFLD (nonalcoholic fatty liver disease) 09/22/2011  . Hypertension   . Hyperlipidemia with target LDL less than [address]PT, DPT   04/08/2019, 6:58 PM  [facility] [address] [place] [address]Phone: [phone]  Fax:  [phone] Name: Andrew SHERRERMRN: [id number] of Birth: [birth year]

## 2019-04-13 ENCOUNTER — Other Ambulatory Visit: Payer: Self-pay

## 2019-04-13 ENCOUNTER — Ambulatory Visit: Payer: BC Managed Care – PPO

## 2019-04-13 DIAGNOSIS — R293 Abnormal posture: Secondary | ICD-10-CM | POA: Diagnosis not present

## 2019-04-13 DIAGNOSIS — M5441 Lumbago with sciatica, right side: Secondary | ICD-10-CM

## 2019-04-13 NOTE — Therapy (Signed)
[facility] [address], [place], [address] Phone: [phone]   Fax:  [phone]  Physical Therapy Treatment  Patient Details  Name: Andrew Molina MRN: [id number] Date of Birth: 01/29/1976 Referring Provider (PT): Deborra Medina, MD   Encounter Date: 04/13/2019  PT End of Session - 04/13/19 1031    Visit Number  9    Number of Visits  13    Date for PT Re-Evaluation  04/22/19    Authorization Type  9    Authorization Time Period  23 per calendar year    PT Start Time  1031    PT Stop Time  1117    PT Time Calculation (min)  46 min    Activity Tolerance  Patient tolerated treatment well    Behavior During Therapy  Andrew Molina for tasks assessed/performed       Past Medical History:  Diagnosis Date  . Cataract   . Diabetes mellitus 2005  . Hyperlipidemia   . Hypertension   . Other chronic nonalcoholic liver disease     Past Surgical History:  Procedure Laterality Date  . SPINE SURGERY     microdiskectomy    There were no vitals filed for this visit.  Subjective Assessment - 04/13/19 1033    Subjective  Back and R LE is alright. R LE radiating symptoms have not gone past his R knee. No back or R LE symptoms currently (pt sitting)    Pertinent History  R sided sciatica. Symptoms are hit or miss per pt but pretty regular. Pain began March. Pain began in his R LE and sometimes in his L. Back feels a little bit weaker and has pain.  Has to walk around a lot at work and his pain wears him out.  Pt does a fair amount of walking on concrete. 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 Had back surgery (microdiscectomy) 17-18 years ago. Pt has 3 ruptured disks in his low back.  Pt feels tingling and numbness R LE (around the L5/S1/S2 dermatome distribution). Denies saddle anesthesia or loss of bowel or bladder control.  Has had PT before after is back surgery.    Patient Stated  Goals  Be able to walk normally, get rid of his pain and symptoms. Be able to spend time with his family after work without being fatigued from his back pain.    Currently in Pain?  No/denies    Pain Score  0-No pain    Pain Onset  More than a month ago                               PT Education - 04/13/19 1047    Education Details  ther-ex    [employer]) Educated  Patient    Methods  Explanation;Demonstration;Tactile cues;Verbal cues    Comprehension  Returned demonstration;Verbalized understanding       Objective   No latex band allergies Blood pressure controlled per pt No lumbar fusions per pt   Sitting posture: L lateral shift, L hip weight bearing.   Static standing: increased R LE symptoms at L4 dermatome.   Decreased R LE symptoms with movements to promote R intervertebral foraminal opening   Supine passive SLR hip flexion  L LE (sciatic tightness) which decreases with cervical flexion  R LE (sciatic tightness). No change with cervical flexion  Pt states that his back surgery was a microdiscectomy. No hardware or fusion in back.    Medbridge Access Code: [email or web address]   Manual therapy  Supine STM R hip flexor muscles  STM R lumbar paraspinal muscles  No increase in symptoms when ambulating       Therapeutic exercise Supine R Leg press (knee straight, R hip extension isometrics) 10x5 seconds for 3 sets  Supine lower trunk rotation 10x3 each LE  Supine crunches 10x2 with 10 second holds   Supine posterior pelvic tilt 10x2 with 10 second holds   Kneeling prayer stretch   Forward 10x10 seconds  R 10x10 seconds  L 10x10 seconds  Improved exercise technique, movement at target joints, use of target muscles after mod verbal, visual, tactile cues.       Response to treatment Pt tolerated session well without aggravation of symptoms.    Clinical impression Continued working on  flexion based and glute and abdominal strengthening exercises today to help decrease extension pressure to low back. Also worked on [employer] to decrease R paraspinal muscle tension today in which R LE symptoms did not worsen with walking today. Symptoms seem to have centralized with flexion based exercises overall secondary to pt reporting R LE paresthesia consistently not going past the knee the past few days. Pt will benefit from continued skilled physical therapy services to decrease back pain, R LE symptoms, improve strength and function.      PT Short Term Goals - 03/08/19 1835      PT SHORT TERM GOAL #1   Title  Patient will be independent with his HEP to improve trunk and hip strength, and promote ability to tolerate standing and work tasks more comfortably for his back and LE.    Baseline  Pt has started his HEP  (03/08/2019)    Time  3    Period  Weeks    Status  New    Target Date  04/01/19        PT Long Term Goals - 04/05/19 1821      PT LONG TERM GOAL #1   Title  Patient will have a decrease in low back pain to 3/10 or less at worst to promote ability to perform standing tasks as well as ability to lift and carry items at work.    Baseline  6/10 back pain at most for the past month (03/08/2019); 3/10 back pain at most for the past 7 days (04/05/2019)    Time  6    Period  Weeks    Status  Partially Met    Target Date  04/22/19      PT LONG TERM GOAL #2   Title  Patient will have a decrease in R LE paresthesia to 2/10 or less at worst to promote ability to tolerate standing and performing work related tasks.    Baseline  8/10 R LE paresthesia at worst for the past month (03/09/2019); 7/10 R LE paresthesia at most for the past 7 days (04/05/2019)    Time  6    Period  Weeks    Status  Partially Met    Target Date  04/22/19      PT LONG TERM GOAL #3   Title  Patient will improve bilateral glute max muscle strength by at least 1/2 MMT grade to promote ability to perform standing  tasks with less back and LE pain.    Baseline  hip extension: 4-/5 R, 4/5 L (03/08/2019)  Time  6    Period  Weeks    Status  New    Target Date  04/22/19            Plan - 04/13/19 1047    Clinical Impression Statement  Continued working on flexion based and glute and abdominal strengthening exercises today to help decrease extension pressure to low back. Also worked on [employer] to decrease R paraspinal muscle tension today in which R LE symptoms did not worsen with walking today. Symptoms seem to have centralized with flexion based exercises overall secondary to pt reporting R LE paresthesia consistently not going past the knee the past few days. Pt will benefit from continued skilled physical therapy services to decrease back pain, R LE symptoms, improve strength and function.    Personal Factors and Comorbidities  Other;Comorbidity 1;Profession    Comorbidities  history of back surgery; job involves heavy lifting at times, walking on concrete floor    Examination-Activity Limitations  Lift;Carry;Stand    Stability/Clinical Decision Making  Evolving/Moderate complexity    Rehab Potential  Fair    PT Frequency  2x / week    PT Duration  6 weeks    PT Treatment/Interventions  Manual techniques;Therapeutic exercise;Therapeutic activities;Gait training;Functional mobility training;Neuromuscular re-education;Patient/family education;Dry needling;Spinal Manipulations;Joint Manipulations;Aquatic Therapy;Electrical Stimulation;Iontophoresis 77m/ml Dexamethasone;Moist Heat;Traction;Ultrasound    PT Next Visit Plan  trunk, glute max, scapular strengthening, thoracic extension, manual techniques, modalities PRN    PT Home Exercise Plan  Medbridge Access Code: [email or web address]   Consulted and Agree with Plan of Care  Patient       Patient will benefit from skilled therapeutic intervention in order to improve the following deficits and impairments:  Pain, Postural dysfunction, Improper body mechanics,  Difficulty walking  Visit Diagnosis: Acute right-sided low back pain with right-sided sciatica  Abnormal posture     Problem List Patient Active Problem List   Diagnosis Date Noted  . Foreign body (FB) in soft tissue 02/16/2019  . Hypertriglyceridemia 02/16/2019  . Sciatica of right side associated with disorder of lumbar spine 02/11/2019  . Controlled type 2 diabetes mellitus with microalbuminuric diabetic nephropathy ([place] 02/11/2018  . Solitary pulmonary nodule 05/11/2016  . Nephrolithiasis 02/27/2016  . Numbness and tingling of foot 03/22/2013  . History of tobacco abuse 02/22/2013  . Generalized anxiety disorder 11/23/2012  . Diabetes mellitus without complication ([place] 199/77/4142 . Gastritis due to nonsteroidal anti-inflammatory drug 05/06/2012  . NAFLD (nonalcoholic fatty liver disease) 09/22/2011  . Hypertension   . Hyperlipidemia with target LDL less than [address]PT, DPT  04/13/2019, 12:36 PM  [facility] [address] [place] [address]Phone: [phone]  Fax:  [phone] Name: Andrew HARTOGMRN: [id number] of Birth: [birth year]

## 2019-04-15 ENCOUNTER — Ambulatory Visit: Payer: BC Managed Care – PPO

## 2019-04-15 ENCOUNTER — Other Ambulatory Visit: Payer: Self-pay

## 2019-04-15 DIAGNOSIS — M5441 Lumbago with sciatica, right side: Secondary | ICD-10-CM | POA: Diagnosis not present

## 2019-04-15 DIAGNOSIS — R293 Abnormal posture: Secondary | ICD-10-CM | POA: Diagnosis not present

## 2019-04-15 NOTE — Therapy (Signed)
[facility] [address] [place], [address] Phone: [phone]   Fax:  [phone]  Physical Therapy Treatment  Patient Details  Name: Andrew Molina MRN: [id number] Date of Birth: Aug 27, 1975 Referring Provider (PT): Deborra Medina, MD   Encounter Date: 04/15/2019  PT End of Session - 04/15/19 1028    Visit Number  10    Number of Visits  13    Date for PT Re-Evaluation  04/22/19    Authorization Type  10    Authorization Time Period  23 per calendar year    PT Start Time  1028    PT Stop Time  1115    PT Time Calculation (min)  47 min    Activity Tolerance  Patient tolerated treatment well    Behavior During Therapy  [facility] for tasks assessed/performed       Past Medical History:  Diagnosis Date  . Cataract   . Diabetes mellitus 2005  . Hyperlipidemia   . Hypertension   . Other chronic nonalcoholic liver disease     Past Surgical History:  Procedure Laterality Date  . SPINE SURGERY     microdiskectomy    There were no vitals filed for this visit.  Subjective Assessment - 04/15/19 1029    Subjective  Slight numbness R proximal half of R thigh. Symptoms have not really gone past the R knee for the past few days. 2/10 back and RLE symptoms currently.    Pertinent History  R sided sciatica. Symptoms are hit or miss per pt but pretty regular. Pain began March. Pain began in his R LE and sometimes in his L. Back feels a little bit weaker and has pain.  Has to walk around a lot at work and his pain wears him out.  Pt does a fair amount of walking on concrete. 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 Had back surgery (microdiscectomy) 17-18 years ago. Pt has 3 ruptured disks in his low back.  Pt feels tingling and numbness R LE (around the L5/S1/S2 dermatome distribution). Denies saddle anesthesia or loss of bowel or bladder control.  Has had PT before after is back  surgery.    Patient Stated Goals  Be able to walk normally, get rid of his pain and symptoms. Be able to spend time with his family after work without being fatigued from his back pain.    Currently in Pain?  Yes    Pain Score  2     Pain Orientation  Right    Pain Onset  More than a month ago                               PT Education - 04/15/19 1035    Education Details  ther-ex    Person(s) Educated  Patient    Methods  Explanation;Demonstration;Tactile cues;Verbal cues    Comprehension  Returned demonstration;Verbalized understanding      Objective   No latex band allergies Blood pressure controlled per pt No lumbar fusions per pt   Sitting posture: L lateral shift, L hip weight bearing.   Static standing: increased R LE symptoms at L4 dermatome.   Decreased R LE symptoms with movements to promote R intervertebral foraminal opening   Supine passive SLR hip flexion  L LE (sciatic tightness) which decreases with cervical flexion  R LE (  sciatic tightness). No change with cervical flexion    Pt states that his back surgery was a microdiscectomy. No hardware or fusion in back.    Medbridge Access Code: [email or web address]   Manual therapy  Seated STM R lumbar paraspinal muscles  Possible decrease in symptoms with gait. Symptoms also decreased with increased distance walked after manual therapy      Therapeutic exercise  Seated physioball rolls   Forward 10x5 seconds   R 10x5 seconds   L 10x5 seconds   Running man R LE with L UE assist 10x slow, L foot on furniture slider  . Good muscle use felt.   R thigh and knee symptoms afterwards  Standing ankle DF/PF on rocker board to promote LE flexibility with B UE assist x 2 minutes   Seated rows at [facility] machine plate 20 for 84K1 seconds for 3 sets to promote thoracic extension and decrease low back extension pressure  Standing B shoulder extension OMEGA  machine 10x5 seconds for 2 sets at plate 20   Try standing hip machine hip extension exercises next visit if appropriate.    Improved exercise technique, movement at target joints, use of target muscles after mod verbal, visual, tactile cues.       Response to treatment Pt tolerated session well without aggravation of symptoms.    Clinical impression R LE symptoms to knee which eased off with increased walking after manual therapy and exerciess. Continued working on flexion related exercises secondary to low back preference. Pt overall demonstrates signs of centralization with symptoms staying around or proximal to R knee. Pt will benefit from continued skilled physical therapy services to decrease pain, improve strength and function.     PT Short Term Goals - 03/08/19 1835      PT SHORT TERM GOAL #1   Title  Patient will be independent with his HEP to improve trunk and hip strength, and promote ability to tolerate standing and work tasks more comfortably for his back and LE.    Baseline  Pt has started his HEP  (03/08/2019)    Time  3    Period  Weeks    Status  New    Target Date  04/01/19        PT Long Term Goals - 04/05/19 1821      PT LONG TERM GOAL #1   Title  Patient will have a decrease in low back pain to 3/10 or less at worst to promote ability to perform standing tasks as well as ability to lift and carry items at work.    Baseline  6/10 back pain at most for the past month (03/08/2019); 3/10 back pain at most for the past 7 days (04/05/2019)    Time  6    Period  Weeks    Status  Partially Met    Target Date  04/22/19      PT LONG TERM GOAL #2   Title  Patient will have a decrease in R LE paresthesia to 2/10 or less at worst to promote ability to tolerate standing and performing work related tasks.    Baseline  8/10 R LE paresthesia at worst for the past month (03/09/2019); 7/10 R LE paresthesia at most for the past 7 days (04/05/2019)    Time  6    Period   Weeks    Status  Partially Met    Target Date  04/22/19      PT LONG TERM GOAL #3  Title  Patient will improve bilateral glute max muscle strength by at least 1/2 MMT grade to promote ability to perform standing tasks with less back and LE pain.    Baseline  hip extension: 4-/5 R, 4/5 L (03/08/2019)    Time  6    Period  Weeks    Status  New    Target Date  04/22/19            Plan - 04/15/19 1037    Clinical Impression Statement  R LE symptoms to knee which eased off with increased walking after manual therapy and exerciess. Continued working on flexion related exercises secondary to low back preference. Pt overall demonstrates signs of centralization with symptoms staying around or proximal to R knee. Pt will benefit from continued skilled physical therapy services to decrease pain, improve strength and function.    Personal Factors and Comorbidities  Other;Comorbidity 1;Profession    Comorbidities  history of back surgery; job involves heavy lifting at times, walking on concrete floor    Examination-Activity Limitations  Lift;Carry;Stand    Stability/Clinical Decision Making  Evolving/Moderate complexity    Rehab Potential  Fair    PT Frequency  2x / week    PT Duration  6 weeks    PT Treatment/Interventions  Manual techniques;Therapeutic exercise;Therapeutic activities;Gait training;Functional mobility training;Neuromuscular re-education;Patient/family education;Dry needling;Spinal Manipulations;Joint Manipulations;Aquatic Therapy;Electrical Stimulation;Iontophoresis 41m/ml Dexamethasone;Moist Heat;Traction;Ultrasound    PT Next Visit Plan  trunk, glute max, scapular strengthening, thoracic extension, manual techniques, modalities PRN    PT Home Exercise Plan  Medbridge Access Code: [email or web address]   Consulted and Agree with Plan of Care  Patient       Patient will benefit from skilled therapeutic intervention in order to improve the following deficits and impairments:  Pain, Postural  dysfunction, Improper body mechanics, Difficulty walking  Visit Diagnosis: Acute right-sided low back pain with right-sided sciatica  Abnormal posture     Problem List Patient Active Problem List   Diagnosis Date Noted  . Foreign body (FB) in soft tissue 02/16/2019  . Hypertriglyceridemia 02/16/2019  . Sciatica of right side associated with disorder of lumbar spine 02/11/2019  . Controlled type 2 diabetes mellitus with microalbuminuric diabetic nephropathy ([facility] 02/11/2018  . Solitary pulmonary nodule 05/11/2016  . Nephrolithiasis 02/27/2016  . Numbness and tingling of foot 03/22/2013  . History of tobacco abuse 02/22/2013  . Generalized anxiety disorder 11/23/2012  . Diabetes mellitus without complication ([place] 179/89/2119 . Gastritis due to nonsteroidal anti-inflammatory drug 05/06/2012  . NAFLD (nonalcoholic fatty liver disease) 09/22/2011  . Hypertension   . Hyperlipidemia with target LDL less than [address]PT, DPT   04/15/2019, 5:13 PM  [facility] [address] [place] [address]Phone: [phone]  Fax:  [phone] Name: Andrew DORTONMRN: [id number] of Birth: [birth year]

## 2019-04-17 ENCOUNTER — Other Ambulatory Visit: Payer: Self-pay | Admitting: Internal Medicine

## 2019-04-20 ENCOUNTER — Other Ambulatory Visit: Payer: Self-pay

## 2019-04-20 ENCOUNTER — Ambulatory Visit: Payer: BC Managed Care – PPO

## 2019-04-20 DIAGNOSIS — M5441 Lumbago with sciatica, right side: Secondary | ICD-10-CM

## 2019-04-20 DIAGNOSIS — R293 Abnormal posture: Secondary | ICD-10-CM | POA: Diagnosis not present

## 2019-04-20 NOTE — Therapy (Signed)
[facility] [address] [place], [address] Phone: [phone]   Fax:  [phone]  Physical Therapy Treatment  Patient Details  Name: Andrew Molina MRN: [id number] Date of Birth: 22-Mar-1976 Referring Provider (PT): Deborra Medina, MD   Encounter Date: 04/20/2019  PT End of Session - 04/20/19 1638    Visit Number  11    Number of Visits  13    Date for PT Re-Evaluation  04/22/19    Authorization Type  11    Authorization Time Period  23 per calendar year    PT Start Time  1618    PT Stop Time  1717    PT Time Calculation (min)  59 min    Activity Tolerance  Patient tolerated treatment well    Behavior During Therapy  [facility] for tasks assessed/performed       Past Medical History:  Diagnosis Date  . Cataract   . Diabetes mellitus 2005  . Hyperlipidemia   . Hypertension   . Other chronic nonalcoholic liver disease     Past Surgical History:  Procedure Laterality Date  . SPINE SURGERY     microdiskectomy    There were no vitals filed for this visit.  Subjective Assessment - 04/20/19 1618    Subjective  2/10 back pain today. No R LE symptoms today. Symptoms were not bad yesterday. Has not sat down much today. Spent most of the time walking back and forth in a small area working a [employer].  Was upright most of the time. Has been moving and lifting things about 50-60 lbs as well as rolling paper onto a machine of about 200 lbs.    Pertinent History  R sided sciatica. Symptoms are hit or miss per pt but pretty regular. Pain began March. Pain began in his R LE and sometimes in his L. Back feels a little bit weaker and has pain.  Has to walk around a lot at work and his pain wears him out.  Pt does a fair amount of walking on concrete. 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 Had back surgery (microdiscectomy) 17-18 years ago. Pt has 3 ruptured disks in his low back.   Pt feels tingling and numbness R LE (around the L5/S1/S2 dermatome distribution). Denies saddle anesthesia or loss of bowel or bladder control.  Has had PT before after is back surgery.    Patient Stated Goals  Be able to walk normally, get rid of his pain and symptoms. Be able to spend time with his family after work without being fatigued from his back pain.    Currently in Pain?  No/denies    Pain Score  --   Back discomfort, not really pain.   Pain Onset  More than a month ago                               PT Education - 04/20/19 1638    Education Details  ther-ex    Person(s) Educated  Patient    Methods  Explanation;Demonstration;Tactile cues;Verbal cues    Comprehension  Returned demonstration;Verbalized understanding      Objective  No latex band allergies Blood pressure controlled per pt No lumbar fusions per pt   Sitting posture: L lateral shift, L hip weight bearing.   Static standing: increased R LE symptoms at L4 dermatome.  Decreased R LE symptoms with movements to promote R intervertebral foraminal opening   Supine passive SLR hip flexion  L LE (sciatic tightness) which decreases with cervical flexion  R LE (sciatic tightness). No change with cervical flexion    Pt states that his back surgery was a microdiscectomy. No hardware or fusion in back.    Medbridge Access Code: [email or web address]       Therapeutic exercise  Seated press-ups to help decrease low back pressure 10x10 seconds. Then 5x10 seconds   Table push-ups 10x3  Standing squats with B UE assist 10x3  hook lying curl ups 10x2 with 5 seconds, then 5x5 seconds   hooklying B hip flexion to target lower abdominals 10x3  Curl ups with opposite knee touch 5x2 each side   Less R LE symptoms with gait after aforementioned abdominal exercises  Planks 10 seconds x 4   Side step with squat 15 ft to the R and 15 ft to the L  R LE  numbness at L4 dermatome to leg with R side step with squat  Side step with squat 20 ft to the L 2x. Minimal R LE symptoms afterwards   Standing hip machine height 3    Hip extension   R plate 40 for 58I3 (easy)    Then plate 55 for 25Q, plate 70 for 98Y6   No R LE symptoms with gait multiple laps    Standing R LE leg press resisting grey theraband on foot  10x2 with 5 second holds   standing R glute extension, grey theraband around thigh, knee bent B UE assist 10x3  R LE symptoms with gait.     Improved exercise technique, movement at target joints, use of target muscles after min to mod verbal, visual, tactile cues.       Response to treatment Fair tolerance to today's session. Good muscle use felt with abdominal exercises.    Clinical impression Continued working on flexion based exercises today, targeting the abdominal and glute muscles. No R LE symptoms with gait in the gym after R glute max strengthening using the hip machine. When tried to simulate exercise with theraband however, pt demonstrates R LE symptoms. Fair tolerance to today's session. Pt will benefit from continued skilled physical therapy services to decrease pain, improve strength and function.       PT Short Term Goals - 03/08/19 1835      PT SHORT TERM GOAL #1   Title  Patient will be independent with his HEP to improve trunk and hip strength, and promote ability to tolerate standing and work tasks more comfortably for his back and LE.    Baseline  Pt has started his HEP  (03/08/2019)    Time  3    Period  Weeks    Status  New    Target Date  04/01/19        PT Long Term Goals - 04/05/19 1821      PT LONG TERM GOAL #1   Title  Patient will have a decrease in low back pain to 3/10 or less at worst to promote ability to perform standing tasks as well as ability to lift and carry items at work.    Baseline  6/10 back pain at most for the past month (03/08/2019); 3/10 back pain at most for  the past 7 days (04/05/2019)    Time  6    Period  Weeks    Status  Partially Met    Target  Date  04/22/19      PT LONG TERM GOAL #2   Title  Patient will have a decrease in R LE paresthesia to 2/10 or less at worst to promote ability to tolerate standing and performing work related tasks.    Baseline  8/10 R LE paresthesia at worst for the past month (03/09/2019); 7/10 R LE paresthesia at most for the past 7 days (04/05/2019)    Time  6    Period  Weeks    Status  Partially Met    Target Date  04/22/19      PT LONG TERM GOAL #3   Title  Patient will improve bilateral glute max muscle strength by at least 1/2 MMT grade to promote ability to perform standing tasks with less back and LE pain.    Baseline  hip extension: 4-/5 R, 4/5 L (03/08/2019)    Time  6    Period  Weeks    Status  New    Target Date  04/22/19            Plan - 04/20/19 1639    Clinical Impression Statement  Continued working on flexion based exercises today, targeting the abdominal and glute muscles. No R LE symptoms with gait in the gym after R glute max strengthening using the hip machine. When tried to simulate exercise with theraband however, pt demonstrates R LE symptoms. Fair tolerance to today's session. Pt will benefit from continued skilled physical therapy services to decrease pain, improve strength and function.    Personal Factors and Comorbidities  Other;Comorbidity 1;Profession    Comorbidities  history of back surgery; job involves heavy lifting at times, walking on concrete floor    Examination-Activity Limitations  Lift;Carry;Stand    Stability/Clinical Decision Making  Evolving/Moderate complexity    Rehab Potential  Fair    PT Frequency  2x / week    PT Duration  6 weeks    PT Treatment/Interventions  Manual techniques;Therapeutic exercise;Therapeutic activities;Gait training;Functional mobility training;Neuromuscular re-education;Patient/family education;Dry needling;Spinal Manipulations;Joint  Manipulations;Aquatic Therapy;Electrical Stimulation;Iontophoresis 106m/ml Dexamethasone;Moist Heat;Traction;Ultrasound    PT Next Visit Plan  trunk, glute max, scapular strengthening, thoracic extension, manual techniques, modalities PRN    PT Home Exercise Plan  Medbridge Access Code: [email or web address]   Consulted and Agree with Plan of Care  Patient       Patient will benefit from skilled therapeutic intervention in order to improve the following deficits and impairments:  Pain, Postural dysfunction, Improper body mechanics, Difficulty walking  Visit Diagnosis: Acute right-sided low back pain with right-sided sciatica  Abnormal posture     Problem List Patient Active Problem List   Diagnosis Date Noted  . Foreign body (FB) in soft tissue 02/16/2019  . Hypertriglyceridemia 02/16/2019  . Sciatica of right side associated with disorder of lumbar spine 02/11/2019  . Controlled type 2 diabetes mellitus with microalbuminuric diabetic nephropathy ([facility] 02/11/2018  . Solitary pulmonary nodule 05/11/2016  . Nephrolithiasis 02/27/2016  . Numbness and tingling of foot 03/22/2013  . History of tobacco abuse 02/22/2013  . Generalized anxiety disorder 11/23/2012  . Diabetes mellitus without complication ([place] 156/43/3295 . Gastritis due to nonsteroidal anti-inflammatory drug 05/06/2012  . NAFLD (nonalcoholic fatty liver disease) 09/22/2011  . Hypertension   . Hyperlipidemia with target LDL less than [address], DPT   04/20/2019, 5:32 PM  [facility] [address] [place] [address]Phone: [phone]  Fax:  [phone]  Name: Andrew Molina MRN: [id number] Date of Birth: 06-25-1976

## 2019-04-22 ENCOUNTER — Telehealth: Payer: Self-pay

## 2019-04-22 ENCOUNTER — Ambulatory Visit: Payer: BC Managed Care – PPO

## 2019-04-22 NOTE — Telephone Encounter (Signed)
No show. Called patient and left a message pertaining to appointment and a reminder for the next follow up session. Return phone call requested. Phone number [phone]) provided.

## 2019-04-27 ENCOUNTER — Other Ambulatory Visit: Payer: Self-pay

## 2019-04-27 ENCOUNTER — Ambulatory Visit: Payer: BC Managed Care – PPO

## 2019-04-27 NOTE — Therapy (Signed)
[facility] [address], [place], [address] Phone: [phone]   Fax:  [phone]  Physical Therapy Screen  Patient Details  Name: Andrew Molina MRN: [id number] Date of Birth: 07/18/1976 Referring Provider (PT): Deborra Medina, MD   Encounter Date: 04/27/2019  PT End of Session - 04/27/19 1704    Visit Number  11    Number of Visits  13    Date for PT Re-Evaluation  04/22/19    Authorization Type  11    Authorization Time Period  23 per calendar year    PT Start Time  1705    PT Stop Time  1715    PT Time Calculation (min)  10 min    Activity Tolerance  Patient tolerated treatment well    Behavior During Therapy  [facility] for tasks assessed/performed       Past Medical History:  Diagnosis Date  . Cataract   . Diabetes mellitus 2005  . Hyperlipidemia   . Hypertension   . Other chronic nonalcoholic liver disease     Past Surgical History:  Procedure Laterality Date  . SPINE SURGERY     microdiskectomy    There were no vitals filed for this visit.  Subjective Assessment - 04/27/19 1706    Subjective  Achy back pain this morning. Had some R LE paresthesia this morning to R proximal medial gastroc (sural nerve), then to R anterior lateral thigh during lunch. 1/10 low back soreness currently and 3/10 back pain at most for the past 7 days. 3/10 R LE symptoms at most for the past 7 days. R LE symptoms still not past the knee but every once in a while it goes to his R proximal medial calf.  Feels like there is improvement. Feels like he can continue his progress with his exercises at home. Feels like the exercises help centralize his symtpoms. Back pain is also better when he wakes up in the morning.  Feels like he does not need the MRI at the moment because he is getting better.    Pertinent History  R sided sciatica. Symptoms are hit or miss per pt but pretty regular. Pain began March. Pain began in his R LE and  sometimes in his L. Back feels a little bit weaker and has pain.  Has to walk around a lot at work and his pain wears him out.  Pt does a fair amount of walking on concrete. 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 Had back surgery (microdiscectomy) 17-18 years ago. Pt has 3 ruptured disks in his low back.  Pt feels tingling and numbness R LE (around the L5/S1/S2 dermatome distribution). Denies saddle anesthesia or loss of bowel or bladder control.  Has had PT before after is back surgery.    Patient Stated Goals  Be able to walk normally, get rid of his pain and symptoms. Be able to spend time with his family after work without being fatigued from his back pain.    Currently in Pain?  Yes    Pain Score  1     Pain Location  Back    Pain Onset  More than a month ago                                Objective  No latex band allergies Blood pressure controlled  per pt No lumbar fusions per pt    Sitting posture: L lateral shift, L hip weight bearing.   Static standing: increased R LE symptoms at L4 dermatome.   Decreased R LE symptoms with movements to promote R intervertebral foraminal opening   Supine passive SLR hip flexion  L LE (sciatic tightness) which decreases with cervical flexion  R LE (sciatic tightness). No change with cervical flexion    Pt states that his back surgery was a microdiscectomy. No hardware or fusion in back.    Medbridge Access Code: [email or web address]      Try manual therapy P to A to lumbar transverse processes next follow up visit in 2 weeks if needed and appropriate    Clinical impression Pt demonstrates very good progress with decreased low back pain and R LE symptoms with R LE paresthesia centralizing predominantly at his R anterior thigh. Symptoms occasionally travel to his proximal medial leg. Has not had his symptoms travel to his foot. Pt responding  well with flexion based movements, abdominal, and glute strengthening. Pt also demonstrating very good compliance and consistency with his HEP. Pt to continue with his HEP and return for follow up in 2 weeks to monitor progress and to determine whether or not he needs to continue with in person physical therapy in the clinic.     PT Short Term Goals - 04/27/19 1726      PT SHORT TERM GOAL #1   Title  Patient will be independent with his HEP to improve trunk and hip strength, and promote ability to tolerate standing and work tasks more comfortably for his back and LE.    Baseline  Pt has started his HEP  (03/08/2019); Pt demonstrates independence and consistency with his HEP (04/27/2019)    Time  3    Period  Weeks    Status  Achieved    Target Date  04/01/19        PT Long Term Goals - 04/27/19 1727      PT LONG TERM GOAL #1   Title  Patient will have a decrease in low back pain to 3/10 or less at worst to promote ability to perform standing tasks as well as ability to lift and carry items at work.    Baseline  6/10 back pain at most for the past month (03/08/2019); 3/10 back pain at most for the past 7 days (04/05/2019); (04/27/2019)    Time  6    Period  Weeks    Status  Achieved    Target Date  04/22/19      PT LONG TERM GOAL #2   Title  Patient will have a decrease in R LE paresthesia to 2/10 or less at worst to promote ability to tolerate standing and performing work related tasks.    Baseline  8/10 R LE paresthesia at worst for the past month (03/09/2019); 7/10 R LE paresthesia at most for the past 7 days (04/05/2019); 3/10 R LE parethesia at most for the past 7 days (04/27/2019)    Time  6    Period  Weeks    Status  Partially Met    Target Date  04/22/19      PT LONG TERM GOAL #3   Title  Patient will improve bilateral glute max muscle strength by at least 1/2 MMT grade to promote ability to perform standing tasks with less back and LE pain.    Baseline  hip extension: 4-/5 R, 4/5 L  (03/08/2019);  5/5 bilaterally (04/05/2019)    Time  6    Period  Weeks    Status  Achieved    Target Date  04/22/19            Plan - 04/27/19 1730    Clinical Impression Statement  Pt demonstrates very good progress with decreased low back pain and R LE symptoms with R LE paresthesia centralizing predominantly at his R anterior thigh. Symptoms occasionally travel to his proximal medial leg. Has not had his symptoms travel to his foot. Pt responding well with flexion based movements, abdominal, and glute strengthening. Pt also demonstrating very good compliance and consistency with his HEP. Pt to continue with his HEP and return for follow up in 2 weeks to monitor progress and to determine whether or not he needs to continue with in person physical therapy in the clinic.    Personal Factors and Comorbidities  Other;Comorbidity 1;Profession    Comorbidities  history of back surgery; job involves heavy lifting at times, walking on concrete floor    Examination-Activity Limitations  Lift;Carry;Stand    Stability/Clinical Decision Making  Evolving/Moderate complexity    Rehab Potential  Fair    PT Frequency  2x / week    PT Duration  6 weeks    PT Treatment/Interventions  Manual techniques;Therapeutic exercise;Therapeutic activities;Gait training;Functional mobility training;Neuromuscular re-education;Patient/family education;Dry needling;Spinal Manipulations;Joint Manipulations;Aquatic Therapy;Electrical Stimulation;Iontophoresis 7m/ml Dexamethasone;Moist Heat;Traction;Ultrasound    PT Next Visit Plan  trunk, glute max, scapular strengthening, thoracic extension, manual techniques, modalities PRN    PT Home Exercise Plan  Medbridge Access Code: [email or web address]   Consulted and Agree with Plan of Care  Patient       Patient will benefit from skilled therapeutic intervention in order to improve the following deficits and impairments:  Pain, Postural dysfunction, Improper body mechanics, Difficulty  walking  Visit Diagnosis: Acute right-sided low back pain with right-sided sciatica  Abnormal posture     Problem List Patient Active Problem List   Diagnosis Date Noted  . Foreign body (FB) in soft tissue 02/16/2019  . Hypertriglyceridemia 02/16/2019  . Sciatica of right side associated with disorder of lumbar spine 02/11/2019  . Controlled type 2 diabetes mellitus with microalbuminuric diabetic nephropathy ([facility] 02/11/2018  . Solitary pulmonary nodule 05/11/2016  . Nephrolithiasis 02/27/2016  . Numbness and tingling of foot 03/22/2013  . History of tobacco abuse 02/22/2013  . Generalized anxiety disorder 11/23/2012  . Diabetes mellitus without complication ([place] 154/65/0354 . Gastritis due to nonsteroidal anti-inflammatory drug 05/06/2012  . NAFLD (nonalcoholic fatty liver disease) 09/22/2011  . Hypertension   . Hyperlipidemia with target LDL less than [address], DPT   04/27/2019, 5:33 PM  [facility] [address] [place] [address]Phone: [phone]  Fax:  [phone] Name: CMYLES TAVELLAMRN: [id number] of Birth: 309-26-1977

## 2019-04-29 ENCOUNTER — Ambulatory Visit: Payer: BC Managed Care – PPO

## 2019-05-07 MED ORDER — GLUCOSE BLOOD VI STRP: ORAL_STRIP | 5 refills | Status: AC

## 2019-05-11 ENCOUNTER — Other Ambulatory Visit: Payer: Self-pay | Admitting: Internal Medicine

## 2019-05-11 NOTE — Telephone Encounter (Signed)
Refilled: 02/11/2019 Last OV: 02/15/2019 Next OV: 05/21/2019

## 2019-05-12 ENCOUNTER — Ambulatory Visit: Payer: BC Managed Care – PPO

## 2019-05-19 ENCOUNTER — Other Ambulatory Visit: Payer: Self-pay

## 2019-05-19 ENCOUNTER — Other Ambulatory Visit (INDEPENDENT_AMBULATORY_CARE_PROVIDER_SITE_OTHER): Payer: BC Managed Care – PPO

## 2019-05-19 DIAGNOSIS — E781 Pure hyperglyceridemia: Secondary | ICD-10-CM | POA: Diagnosis not present

## 2019-05-19 DIAGNOSIS — E1121 Type 2 diabetes mellitus with diabetic nephropathy: Secondary | ICD-10-CM | POA: Diagnosis not present

## 2019-05-19 LAB — LIPID PANEL
Cholesterol: 182 mg/dL (ref 0–200)
HDL: 39.5 mg/dL (ref >39.00)
LDL Cholesterol: 104 mg/dL — ABNORMAL HIGH (ref 0–99)
NonHDL: 142.78
Total CHOL/HDL Ratio: 5
Triglycerides: 192 mg/dL — ABNORMAL HIGH (ref 0.0–149.0)
VLDL: 38.4 mg/dL (ref 0.0–40.0)

## 2019-05-19 LAB — HEMOGLOBIN A1C: Hgb A1c MFr Bld: 7.9 % — ABNORMAL HIGH (ref 4.6–6.5)

## 2019-05-19 LAB — COMPREHENSIVE METABOLIC PANEL
ALT: 38 U/L (ref 0–53)
AST: 21 U/L (ref 0–37)
Albumin: 5 g/dL (ref 3.5–5.2)
Alkaline Phosphatase: 53 U/L (ref 39–117)
BUN: 14 mg/dL (ref 6–23)
CO2: 28 mEq/L (ref 19–32)
Calcium: 10 mg/dL (ref 8.4–10.5)
Chloride: 100 mEq/L (ref 96–112)
Creatinine, Ser: 0.68 mg/dL (ref 0.40–1.50)
GFR: 126.95 mL/min (ref >60.00)
Glucose, Bld: 147 mg/dL — ABNORMAL HIGH (ref 70–99)
Potassium: 4.5 mEq/L (ref 3.5–5.1)
Sodium: 139 mEq/L (ref 135–145)
Total Bilirubin: 0.7 mg/dL (ref 0.2–1.2)
Total Protein: 7.1 g/dL (ref 6.0–8.3)

## 2019-05-21 ENCOUNTER — Ambulatory Visit (INDEPENDENT_AMBULATORY_CARE_PROVIDER_SITE_OTHER): Payer: BC Managed Care – PPO | Admitting: Internal Medicine

## 2019-05-21 ENCOUNTER — Other Ambulatory Visit: Payer: Self-pay

## 2019-05-21 ENCOUNTER — Encounter: Payer: Self-pay | Admitting: Internal Medicine

## 2019-05-21 DIAGNOSIS — E785 Hyperlipidemia, unspecified: Secondary | ICD-10-CM | POA: Diagnosis not present

## 2019-05-21 DIAGNOSIS — I10 Essential (primary) hypertension: Secondary | ICD-10-CM | POA: Diagnosis not present

## 2019-05-21 DIAGNOSIS — E119 Type 2 diabetes mellitus without complications: Secondary | ICD-10-CM

## 2019-05-21 MED ORDER — ATORVASTATIN CALCIUM 40 MG PO TABS: 40.0000 mg | ORAL_TABLET | Freq: Every day | ORAL | 1 refills | Status: DC

## 2019-05-21 NOTE — Assessment & Plan Note (Signed)
He has lost control again due to poor eating habit.  discussed starting insulin which he is willing to start again if it will reduce his pill burden.  Would continue Jardiance and metformin and stop glipizide once he sees Eula Fried for initiation of insulin therapy

## 2019-05-21 NOTE — Assessment & Plan Note (Signed)
LDL not at goal on current medications  liver enzymes are normal. No changes today. Will increase Lipitor dose to 40 mg daily with next refill. encouraged to follow a low glycemic index diet, weight  loss and regular exercise.  Advised t O  repeat in 6 months.   Lab Results  Component Value Date   CHOL 182 05/19/2019   HDL 39.50 05/19/2019   LDLCALC 104 (H) 05/19/2019   LDLDIRECT 100.0 02/11/2019   TRIG 192.0 (H) 05/19/2019   CHOLHDL 5 05/19/2019   Lab Results  Component Value Date   ALT 38 05/19/2019   AST 21 05/19/2019   ALKPHOS 53 05/19/2019   BILITOT 0.7 05/19/2019

## 2019-05-21 NOTE — Progress Notes (Signed)
Virtual Visit via [email or web address]   This visit type was conducted due to national recommendations for restrictions regarding the COVID-19 pandemic (e.g. social distancing).  This format is felt to be most appropriate for this patient at this time.  All issues noted in this document were discussed and addressed.  No physical exam was performed (except for noted visual exam findings with Video Visits).   I connected with@ on 05/21/19 at 11:00 AM EDT by a video enabled telemedicine application and verified that I am speaking with the correct person using two identifiers. Location patient: home Location provider: work or home office Persons participating in the virtual visit: patient, provider  I discussed the limitations, risks, security and privacy concerns of performing an evaluation and management service by telephone and the availability of in person appointments. I also discussed with the patient that there may be a patient responsible charge related to this service. The patient expressed understanding and agreed to proceed.   Reason for visit: diabetes follow up   HPI:   3 month follow up on diabetes.  Patient has no complaints today.  Patient is following a low glycemic index diet about 50% of the time and taking all prescribed medications regularly (metformin, jardiance and glipizide all at maximal doses)  without side effects.  Fasting sugars have been under less than 140 most of the time and post prandials have been ranging from 170 to 190 .  Patient is not  exercising regularly or  intentionally trying to lose weight .  Patient has had an eye exam in the last 12 months and checks feet regularly for signs of infection.  Patient does not walk barefoot outside,  And denies an numbness tingling or burning in feet. Patient is up to date on all recommended vaccinations   ROS: See pertinent positives and negatives per HPI.  Past Medical History:  Diagnosis Date  . Cataract   . Diabetes mellitus  2005  . Hyperlipidemia   . Hypertension   . Other chronic nonalcoholic liver disease     Past Surgical History:  Procedure Laterality Date  . SPINE SURGERY     microdiskectomy    Family History  Problem Relation Age of Onset  . Diabetes Mother   . Hypertension Mother   . Hypertension Father   . Heart disease Father     SOCIAL HX:  reports that he quit smoking about 18 years ago. His smoking use included e-cigarettes. He quit smokeless tobacco use about 6 years ago.  His smokeless tobacco use included snuff. He reports current alcohol use of about 5.0 standard drinks of alcohol per week. He reports that he does not use drugs.   Current Outpatient Medications:  .  ALPRAZolam (XANAX) 1 MG tablet, TAKE 1 TABLET BY MOUTH ONCE DAILY AS NEEDED FOR SLEEP, Disp: 30 tablet, Rfl: 0 .  atorvastatin (LIPITOR) 40 MG tablet, Take 1 tablet (40 mg total) by mouth daily., Disp: 90 tablet, Rfl: 1 .  empagliflozin (JARDIANCE) 25 MG TABS tablet, Take 25 mg by mouth daily., Disp: 90 tablet, Rfl: 1 .  glipiZIDE (GLUCOTROL) 10 MG tablet, TAKE 1 TABLET BY MOUTH TWICE DAILY BEFORE MEAL(S), Disp: 180 tablet, Rfl: 1 .  glucose blood test strip, Check sugar twice daily. One Touch Ultra strips. Dx: E11.65, Disp: 200 each, Rfl: 5 .  losartan (COZAAR) 25 MG tablet, Take 1 tablet (25 mg total) by mouth at bedtime., Disp: 90 tablet, Rfl: 1 .  metFORMIN (GLUCOPHAGE) 1000 MG tablet, Take 1  tablet by mouth twice daily, Disp: 180 tablet, Rfl: 1 .  naproxen sodium (ANAPROX) 220 MG tablet, Take 220 mg by mouth 2 (two) times daily with a meal., Disp: , Rfl:   EXAM:  VITALS per patient if applicable:  GENERAL: alert, oriented, appears well and in no acute distress  HEENT: atraumatic, conjunttiva clear, no obvious abnormalities on inspection of external nose and ears  NECK: normal movements of the head and neck  LUNGS: on inspection no signs of respiratory distress, breathing rate appears normal, no obvious gross  SOB, gasping or wheezing  CV: no obvious cyanosis  MS: moves all visible extremities without noticeable abnormality  PSYCH/NEURO: pleasant and cooperative, no obvious depression or anxiety, speech and thought processing grossly intact  ASSESSMENT AND PLAN:  Discussed the following assessment and plan:  Diabetes mellitus without complication (HCC) - Plan: Amb Referral to Clinical Pharmacist  Essential hypertension  Hyperlipidemia with target LDL less than 70 - Plan: atorvastatin (LIPITOR) 40 MG tablet  Diabetes mellitus without complication ([place]) He has lost control again due to poor eating habit.  discussed starting insulin which he is willing to start again if it will reduce his pill burden.  Would continue Jardiance and metformin and stop glipizide once he sees Eula Fried for initiation of insulin therapy   Hypertension Well controlled on current regimen. I have changed patient's ACE Inhibitor to an ARB  based on increased reports of  angioedema.  I also advised patient to to take it at night instead of morning,  as recent studies have shown a reduction in incidence of heart attacks and strokes.   Hyperlipidemia with target LDL less than 70 LDL not at goal on current medications  liver enzymes are normal. No changes today. Will increase Lipitor dose to 40 mg daily with next refill. encouraged to follow a low glycemic index diet, weight  loss and regular exercise.  Advised t O  repeat in 6 months.   Lab Results  Component Value Date   CHOL 182 05/19/2019   HDL 39.50 05/19/2019   LDLCALC 104 (H) 05/19/2019   LDLDIRECT 100.0 02/11/2019   TRIG 192.0 (H) 05/19/2019   CHOLHDL 5 05/19/2019   Lab Results  Component Value Date   ALT 38 05/19/2019   AST 21 05/19/2019   ALKPHOS 53 05/19/2019   BILITOT 0.7 05/19/2019       I discussed the assessment and treatment plan with the patient. The patient was provided an opportunity to ask questions and all were answered. The patient  agreed with the plan and demonstrated an understanding of the instructions.   The patient was advised to call back or seek an in-person evaluation if the symptoms worsen or if the condition fails to improve as anticipated.  I provided  25 minutes of non-face-to-face time during this encounter reviewing patient's current problems and post surgeries.  Providing counseling on the above mentioned problems , and coordination  of care . [name], MD

## 2019-05-21 NOTE — Assessment & Plan Note (Signed)
Well controlled on current regimen. I have changed patient's ACE Inhibitor to an ARB  based on increased reports of  angioedema.  I also advised patient to to take it at night instead of morning,  as recent studies have shown a reduction in incidence of heart attacks and strokes.

## 2019-06-07 ENCOUNTER — Ambulatory Visit: Payer: BC Managed Care – PPO | Admitting: Pharmacist

## 2019-06-07 NOTE — Chronic Care Management (AMB) (Signed)
  Chronic Care Management   Note  06/07/2019 Name: DEAVEN URWIN MRN: [id number] DOB: Jun 16, 1976  LAURA RADILLA is a 43 y.o. year old male who is a primary care patient of Derrel Nip, [name], MD. The CCM team was consulted for assistance with chronic disease management and care coordination needs.    Attempted to contact patient to schedule appointment; left HIPAA compliant message for patient to return my call at his convenience.   Follow up plan: - If I do not hear back, will outreach again in the next 2-3 weeks  [name], PharmD, [place] Pharmacist [place] [phone]

## 2019-06-09 ENCOUNTER — Other Ambulatory Visit: Payer: Self-pay | Admitting: Internal Medicine

## 2019-06-09 NOTE — Telephone Encounter (Signed)
Refilled: 05/12/2019 Last OV: 05/21/2019 Next OV: not scheduled

## 2019-06-14 ENCOUNTER — Encounter: Payer: Self-pay | Admitting: Pharmacist

## 2019-06-14 ENCOUNTER — Ambulatory Visit: Payer: BC Managed Care – PPO | Admitting: Pharmacist

## 2019-06-14 DIAGNOSIS — E119 Type 2 diabetes mellitus without complications: Secondary | ICD-10-CM

## 2019-06-14 MED ORDER — OZEMPIC (0.25 OR 0.5 MG/DOSE) 2 MG/1.5ML ~~LOC~~ SOPN: 0.5000 mg | PEN_INJECTOR | SUBCUTANEOUS | 2 refills | Status: DC

## 2019-06-14 NOTE — Chronic Care Management (AMB) (Signed)
Chronic Care Management   Note  06/14/2019 Name: Andrew Molina MRN: [id number] DOB: 11-08-1975   Subjective:  Andrew Molina is a 43 y.o. year old male who is a primary care patient of [name], [name], MD. The CCM team was consulted for assistance with chronic disease management and care coordination needs.    Contacted patient for medication management review.   Review of patient status, including review of consultants reports, laboratory and other test data, was performed as part of comprehensive evaluation and provision of chronic care management services.   Objective:  Lab Results  Component Value Date   CREATININE 0.68 05/19/2019   CREATININE 0.80 02/11/2019   CREATININE 0.75 05/13/2018    Lab Results  Component Value Date   HGBA1C 7.9 (H) 05/19/2019       Component Value Date/Time   CHOL 182 05/19/2019 0823   TRIG 192.0 (H) 05/19/2019 0823   HDL 39.50 05/19/2019 0823   CHOLHDL 5 05/19/2019 0823   VLDL 38.4 05/19/2019 0823   LDLCALC 104 (H) 05/19/2019 0823   LDLDIRECT 100.0 02/11/2019 1454    Clinical ASCVD: No  The 10-year ASCVD risk score Mikey Bussing DC Jr., et al., 2013) is: 5.1%   Values used to calculate the score:     Age: 35 years     Sex: Male     Is Non-Hispanic African American: No     Diabetic: Yes     Tobacco smoker: No     Systolic Blood Pressure: 856 mmHg     Is BP treated: Yes     HDL Cholesterol: 39.5 mg/dL     Total Cholesterol: 182 mg/dL    BP Readings from Last 3 Encounters:  02/18/19 (!) 144/81  08/13/18 102/70  02/10/18 110/76    Allergies  Allergen Reactions  . Prednisone     Jittery.  Insomnia.  . Venlafaxine Anxiety    Increased irritability    Medications Reviewed Today    Reviewed by De Hollingshead, [facility] (Pharmacist) on 06/14/19 at 1237  Med List Status: <None>  Medication Order Taking? Sig Documenting Provider Last Dose Status Informant  ALPRAZolam (XANAX) 1 MG tablet [id number] Yes TAKE 1 TABLET BY MOUTH ONCE DAILY AS  NEEDED FOR SLEEP [name], MD Taking Active            Med Note Mayo Ao Jun 14, 2019 12:36 PM) Using very rarely  atorvastatin (LIPITOR) 40 MG tablet [id number] Yes Take 1 tablet (40 mg total) by mouth daily. [name], MD Taking Active   empagliflozin (JARDIANCE) 25 MG TABS tablet [id number] Yes Take 25 mg by mouth daily. [name], MD Taking Active   glipiZIDE (GLUCOTROL) 10 MG tablet [id number] Yes TAKE 1 TABLET BY MOUTH TWICE DAILY BEFORE MEAL(S) [name], MD Taking Active   glucose blood test strip [id number] Yes Check sugar twice daily. One Touch Ultra strips. Dx: E11.65 [name], MD Taking Active   losartan (COZAAR) 25 MG tablet [id number] Yes Take 1 tablet (25 mg total) by mouth at bedtime. [name], MD Taking Active   metFORMIN (GLUCOPHAGE) 1000 MG tablet [id number] Yes Take 1 tablet by mouth twice daily [name], MD Taking Active   naproxen sodium (ANAPROX) 220 MG tablet [id number] Yes Take 220 mg by mouth 2 (two) times daily with a meal. [provider] Taking Active            Med Note (Raymel Cull  E   Mon Jun 14, 2019 12:37 PM) QAM           Assessment:   Goals Addressed            This Visit's Progress     Patient Stated   . "I want to improve my sugars" (pt-stated)       Current Barriers:  . Diabetes: uncontrolled; most recent A1c 7.9%   o Notes he was originally dx in 2006  . Current antihyperglycemic regimen: metformin 1000 mg BID, glipizide 10 mg BID, Jardiance 25 mg daily o Previous hx DPP4 w/ no benefit  . Denies any issues w/ hypoglycemia; notes that he hasn't felt hypoglycemia for ~2-3 years, wonders if glipizide is even working anymore . Current blood glucose readings:  o Fasting: 116-140s  o 2 hour post supper: 140-230s . Cardiovascular risk reduction: o Current hypertensive regimen: losartan 25 mg daily o Current hyperlipidemia regimen: atorvastatin 40 mg daily (recently increased)   Pharmacist Clinical Goal(s):  [name] Over the next 90 days, patient will work with PharmD and primary care provider to address optimized glycemic benefit  Interventions: . Comprehensive medication review performed, medication list updated in electronic medical record . Reviewed goal A1c, goal fasting, and goal 2 hour post prandial glucose readings . Discussed mechanism of action of current antihyperglycemic medications. Discussed that glipizide may no longer be providing benefit  . Reviewed mechanism of action, side effects of GLP1. Start Ozempic 0.25 mg once weekly x 4 weeks then increase to Ozempic 0.5 mg once weekly.  . Discontinue glipizide.  . Discussed use of copay cards for Ozempic and Jardiance.  . Reviewed statin and ARB refill histories- both up to date   Patient Self Care Activities:  . Patient will check blood glucose BID , document, and provide at future appointments . Patient will take medications as prescribed . Patient will report any questions or concerns to provider   Initial goal documentation        Plan: - Will outreach patient in the next ~4 weeks for continued medication management support  [name], PharmD, [place] Pharmacist [place] [phone]

## 2019-06-14 NOTE — Patient Instructions (Signed)
Visit Information  Goals Addressed            This Visit's Progress     Patient Stated   . "I want to improve my sugars" (pt-stated)       Current Barriers:  . Diabetes: uncontrolled; most recent A1c 7.9%   o Notes he was originally dx in 2006  . Current antihyperglycemic regimen: metformin 1000 mg BID, glipizide 10 mg BID, Jardiance 25 mg daily o Previous hx DPP4 w/ no benefit  . Denies any issues w/ hypoglycemia; notes that he hasn't felt hypoglycemia for ~2-3 years, wonders if glipizide is even working anymore . Current blood glucose readings:  o Fasting: 116-140s  o 2 hour post supper: 140-230s . Cardiovascular risk reduction: o Current hypertensive regimen: losartan 25 mg daily o Current hyperlipidemia regimen: atorvastatin 40 mg daily (recently increased)  Pharmacist Clinical Goal(s):  [name] Over the next 90 days, patient will work with PharmD and primary care provider to address optimized glycemic benefit  Interventions: . Comprehensive medication review performed, medication list updated in electronic medical record . Reviewed goal A1c, goal fasting, and goal 2 hour post prandial glucose readings . Discussed mechanism of action of current antihyperglycemic medications. Discussed that glipizide may no longer be providing benefit  . Reviewed mechanism of action, side effects of GLP1. Start Ozempic 0.25 mg once weekly x 4 weeks then increase to Ozempic 0.5 mg once weekly.  . Discontinue glipizide.  . Discussed use of copay cards for Ozempic and Jardiance.  . Reviewed statin and [facility]B refill histories- both up to date   Patient Self Care Activities:  . Patient will check blood glucose BID , document, and provide at future appointments . Patient will take medications as prescribed . Patient will report any questions or concerns to provider   Initial goal documentation        The patient verbalized understanding of instructions provided today and declined a print copy of  patient instruction materials.   Plan: - Will outreach patient in the next ~4 weeks for continued medication management support  [name], PharmD, [place] Pharmacist [place] [phone]

## 2019-06-24 ENCOUNTER — Telehealth: Payer: BC Managed Care – PPO

## 2019-06-25 ENCOUNTER — Telehealth: Payer: Self-pay

## 2019-06-25 ENCOUNTER — Telehealth: Payer: Self-pay | Admitting: Internal Medicine

## 2019-06-25 DIAGNOSIS — E785 Hyperlipidemia, unspecified: Secondary | ICD-10-CM

## 2019-06-25 DIAGNOSIS — E119 Type 2 diabetes mellitus without complications: Secondary | ICD-10-CM

## 2019-06-25 NOTE — Telephone Encounter (Signed)
Copied from [place] [phone]. Topic: Quick Communication - See Telephone Encounter >> Jun 25, 2019  9:17 AM Blase Mess A wrote: CRM for notification. See Telephone encounter for: 06/25/19.  Patient is returning [place] Call. Please advise CB- [phone]

## 2019-06-25 NOTE — Telephone Encounter (Signed)
See previous message

## 2019-06-25 NOTE — Telephone Encounter (Signed)
Pt has a virtual appt on 08/23/2019 for 64mfollow up diabetes, I made the lab appt already, just need orders in . Please let me know if patient does not need labs so I can cancel appt. Thanks.

## 2019-06-28 NOTE — Telephone Encounter (Signed)
Pt is scheduled for labs on 08/20/2019 and a follow up with you on 08/23/2019. I have placed the lab orders. Ordered a CMP, Lipid, and A1c. Is there anything else that needs to be ordered?

## 2019-06-28 NOTE — Telephone Encounter (Signed)
Thanks.  Urinalysis for protein added

## 2019-07-12 ENCOUNTER — Ambulatory Visit: Payer: BC Managed Care – PPO | Admitting: Pharmacist

## 2019-07-12 DIAGNOSIS — E1165 Type 2 diabetes mellitus with hyperglycemia: Secondary | ICD-10-CM

## 2019-07-12 NOTE — Patient Instructions (Signed)
Visit Information  Goals Addressed            This Visit's Progress     Patient Stated   . "I want to improve my sugars" (pt-stated)       Current Barriers:  . Diabetes: uncontrolled; most recent A1c 7.9%   o At last visit, started Ozempic. Endorses an on and off feeling of not feeling hungry; not having a negative impact on QoL o Has lost ~10-12 lbs . Current antihyperglycemic regimen: metformin 1000 mg BID, Jardiance 25 mg daily, Ozempic 0.25 mg (due to increase to 0.5 mg this Wednesday) o Previous hx DPP4 w/ no benefit  . Denies any issues w/ hypoglycemia; notes that he hasn't felt hypoglycemia for ~2-3 years, wonders if glipizide is even working anymore . Current blood glucose readings:  o Fasting: 110, 125, 130, 176, 112, 127, 113, 115, 133 o 2 hour post supper: 102, 282, 115, 157, 183, 254, 176 . Cardiovascular risk reduction: o Current hypertensive regimen: losartan 25 mg daily o Current hyperlipidemia regimen: atorvastatin 40 mg daily (recently increased)  Pharmacist Clinical Goal(s):  [name] Over the next 90 days, patient will work with PharmD and primary care provider to address optimized glycemic benefit  Interventions: . Comprehensive medication review performed, medication list updated in electronic medical record . Reviewed goal A1c, goal fasting, and goal 2 hour post prandial glucose readings . Discussed that impact on appetite relates to efficacy of Ozempic. Encouraged to increase to 0.5 mg weekly as scheduled this week. Continue metformin 1000 mg BID and Jardiance 25 mg daily.  . Continue checking fasting and 2 hour post prandial glucose readings. Encouraged to use post-meal readings to evaluate carbohydrate content of meals, and adjust future meals as needed (eg, if reading >180, review carb content of meal and reduce the next time he has a similar meal)  Patient Self Care Activities:  . Patient will check blood glucose BID , document, and provide at future  appointments . Patient will take medications as prescribed . Patient will report any questions or concerns to provider   Please see past updates related to this goal by clicking on the "Past Updates" button in the selected goal         The patient verbalized understanding of instructions provided today and declined a print copy of patient instruction materials.   Plan: - Patient has f/u with Dr. Derrel Nip in ~4 weeks; scheduled f/u phone call ~4 weeks after that for continued medication management review  [name], PharmD, [place], [place] Pharmacist [place] [phone]

## 2019-07-12 NOTE — Chronic Care Management (AMB) (Signed)
Chronic Care Management   Follow Up Note   07/12/2019 Name: Andrew Molina MRN: [id number] DOB: [birth year]  Referred by: [name], MD Reason for referral : Chronic Care Management (Medication Management)   Andrew Molina is a 43 y.o. year old male who is a primary care patient of [name], [name], MD. The CCM team was consulted for assistance with chronic disease management and care coordination needs.    Contacted patient for medication management review.   Review of patient status, including review of consultants reports, relevant laboratory and other test results, and collaboration with appropriate care team members and the patient's provider was performed as part of comprehensive patient evaluation and provision of chronic care management services.    SDOH (Social Determinants of Health) screening performed today: None. See Care Plan for related entries.   Outpatient Encounter Medications as of 07/12/2019  Medication Sig Note  . empagliflozin (JARDIANCE) 25 MG TABS tablet Take 25 mg by mouth daily.   . metFORMIN (GLUCOPHAGE) 1000 MG tablet Take 1 tablet by mouth twice daily   . Semaglutide,0.25 or 0.5MG/DOS, (OZEMPIC, 0.25 OR 0.5 MG/DOSE,) 2 MG/1.5ML SOPN Inject 0.5 mg into the skin once a week. Inject 0.25 mg once weekly x 4 weeks then increase to 0.5 mg once weekly   . ALPRAZolam (XANAX) 1 MG tablet TAKE 1 TABLET BY MOUTH ONCE DAILY AS NEEDED FOR SLEEP 06/14/2019: Using very rarely  . atorvastatin (LIPITOR) 40 MG tablet Take 1 tablet (40 mg total) by mouth daily.   [name] glucose blood test strip Check sugar twice daily. One Touch Ultra strips. Dx: E11.65   . losartan (COZAAR) 25 MG tablet Take 1 tablet (25 mg total) by mouth at bedtime.   . naproxen sodium (ANAPROX) 220 MG tablet Take 220 mg by mouth 2 (two) times daily with a meal. 06/14/2019: QAM   No facility-administered encounter medications on file as of 07/12/2019.      Goals Addressed            This Visit's Progress     Patient Stated   . "I want to improve my sugars" (pt-stated)       Current Barriers:  . Diabetes: uncontrolled; most recent A1c 7.9%   o At last visit, started Ozempic. Endorses an on and off feeling of not feeling hungry; not having a negative impact on QoL o Has lost ~10-12 lbs . Current antihyperglycemic regimen: metformin 1000 mg BID, Jardiance 25 mg daily, Ozempic 0.25 mg (due to increase to 0.5 mg this Wednesday) o Previous hx DPP4 w/ no benefit  . Denies any issues w/ hypoglycemia; notes that he hasn't felt hypoglycemia for ~2-3 years, wonders if glipizide is even working anymore . Current blood glucose readings:  o Fasting: 110, 125, 130, 176, 112, 127, 113, 115, 133 o 2 hour post supper: 102, 282, 115, 157, 183, 254, 176 . Cardiovascular risk reduction: o Current hypertensive regimen: losartan 25 mg daily o Current hyperlipidemia regimen: atorvastatin 40 mg daily (recently increased)  Pharmacist Clinical Goal(s):  [name] Over the next 90 days, patient will work with PharmD and primary care provider to address optimized glycemic benefit  Interventions: . Comprehensive medication review performed, medication list updated in electronic medical record . Reviewed goal A1c, goal fasting, and goal 2 hour post prandial glucose readings . Discussed that impact on appetite relates to efficacy of Ozempic. Encouraged to increase to 0.5 mg weekly as scheduled this week. Continue metformin 1000 mg BID and Jardiance 25  mg daily.  . Continue checking fasting and 2 hour post prandial glucose readings. Encouraged to use post-meal readings to evaluate carbohydrate content of meals, and adjust future meals as needed (eg, if reading >180, review carb content of meal and reduce the next time he has a similar meal)  Patient Self Care Activities:  . Patient will check blood glucose BID , document, and provide at future appointments . Patient will take medications as prescribed . Patient will report any  questions or concerns to provider   Please see past updates related to this goal by clicking on the "Past Updates" button in the selected goal          Plan: - Patient has f/u with Dr. Derrel Nip in ~4 weeks; scheduled f/u phone call ~4 weeks after that for continued medication management review  [name], PharmD, [place], [place] Pharmacist [place] [phone]

## 2019-07-22 IMAGING — CR FACIAL BONES - 1-2 VIEW
1 series · 2 of 2 positions shown · non-contrast
Comparison: None.

CLINICAL DATA: Foreign body post injury 1.5 months ago.

EXAM:
FACIAL BONES - 1-2 VIEW

[Series 1: dg facial bones 1-2 views · 0.14mm/px · 2 of 2 slices shown]
[im 1/2]
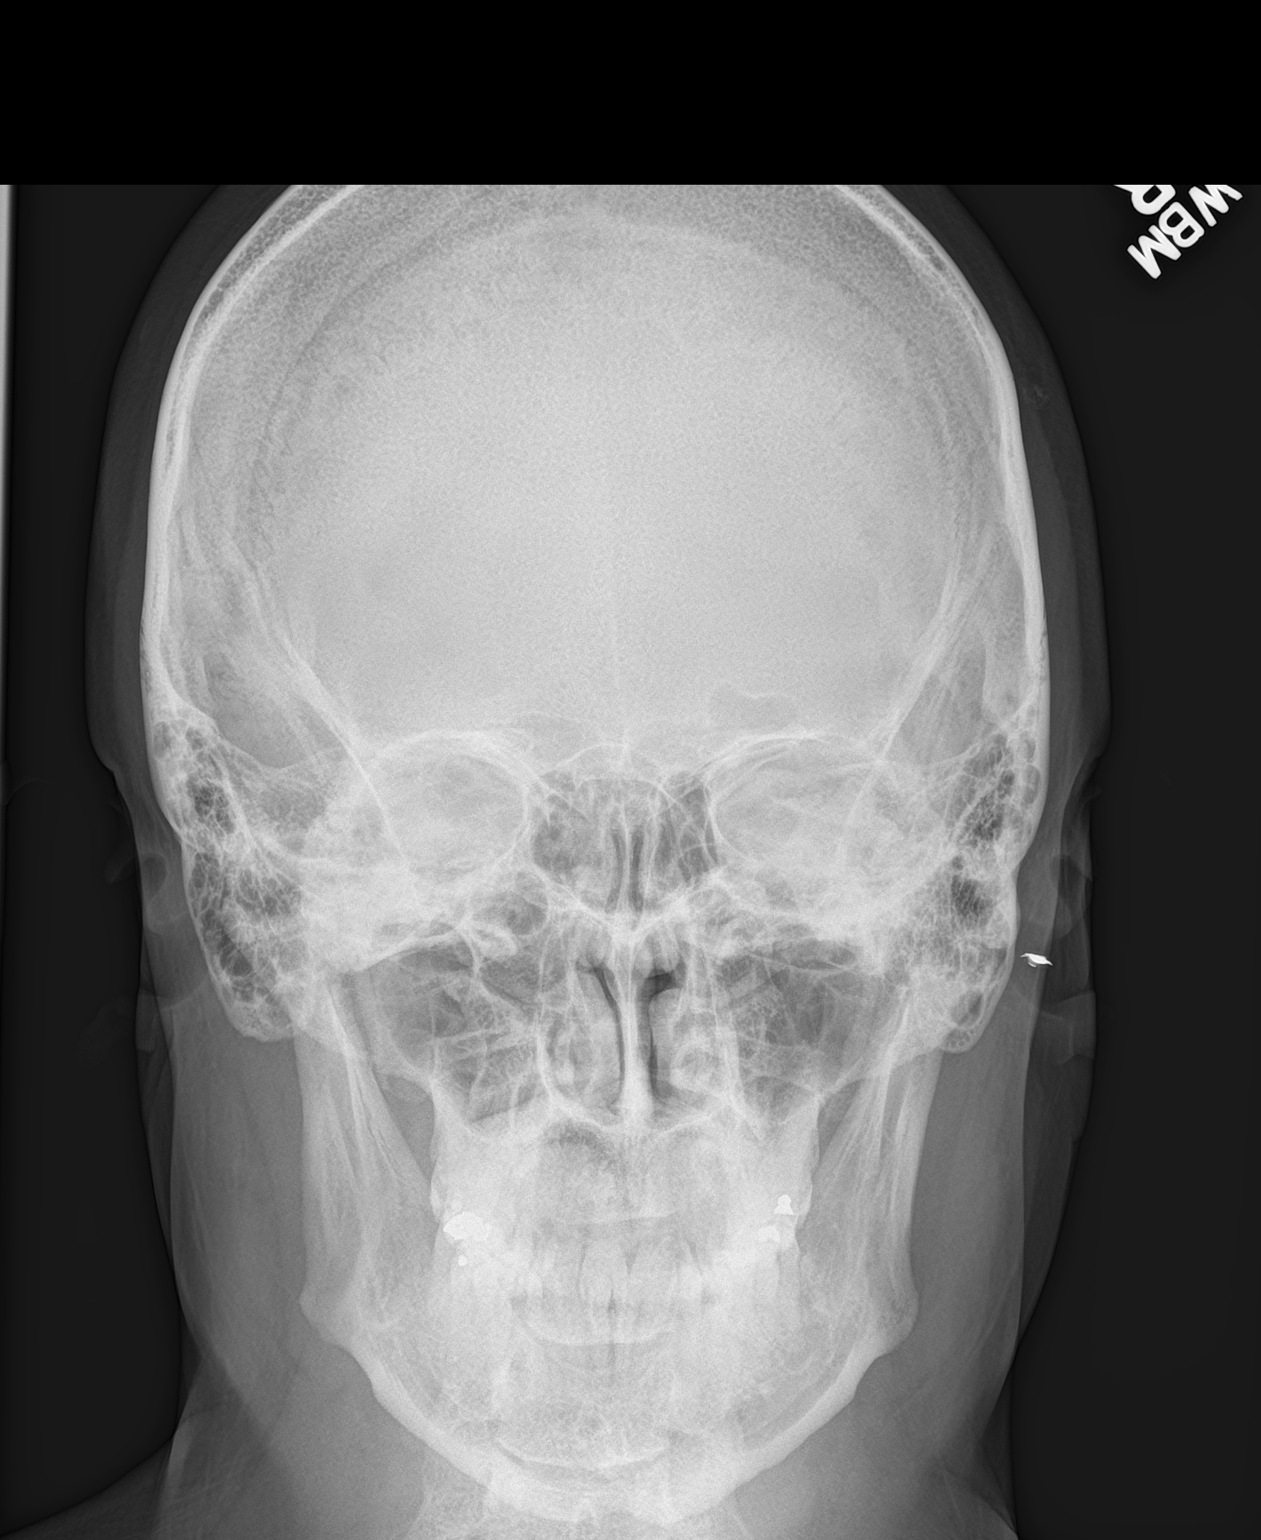
[im 2/2]
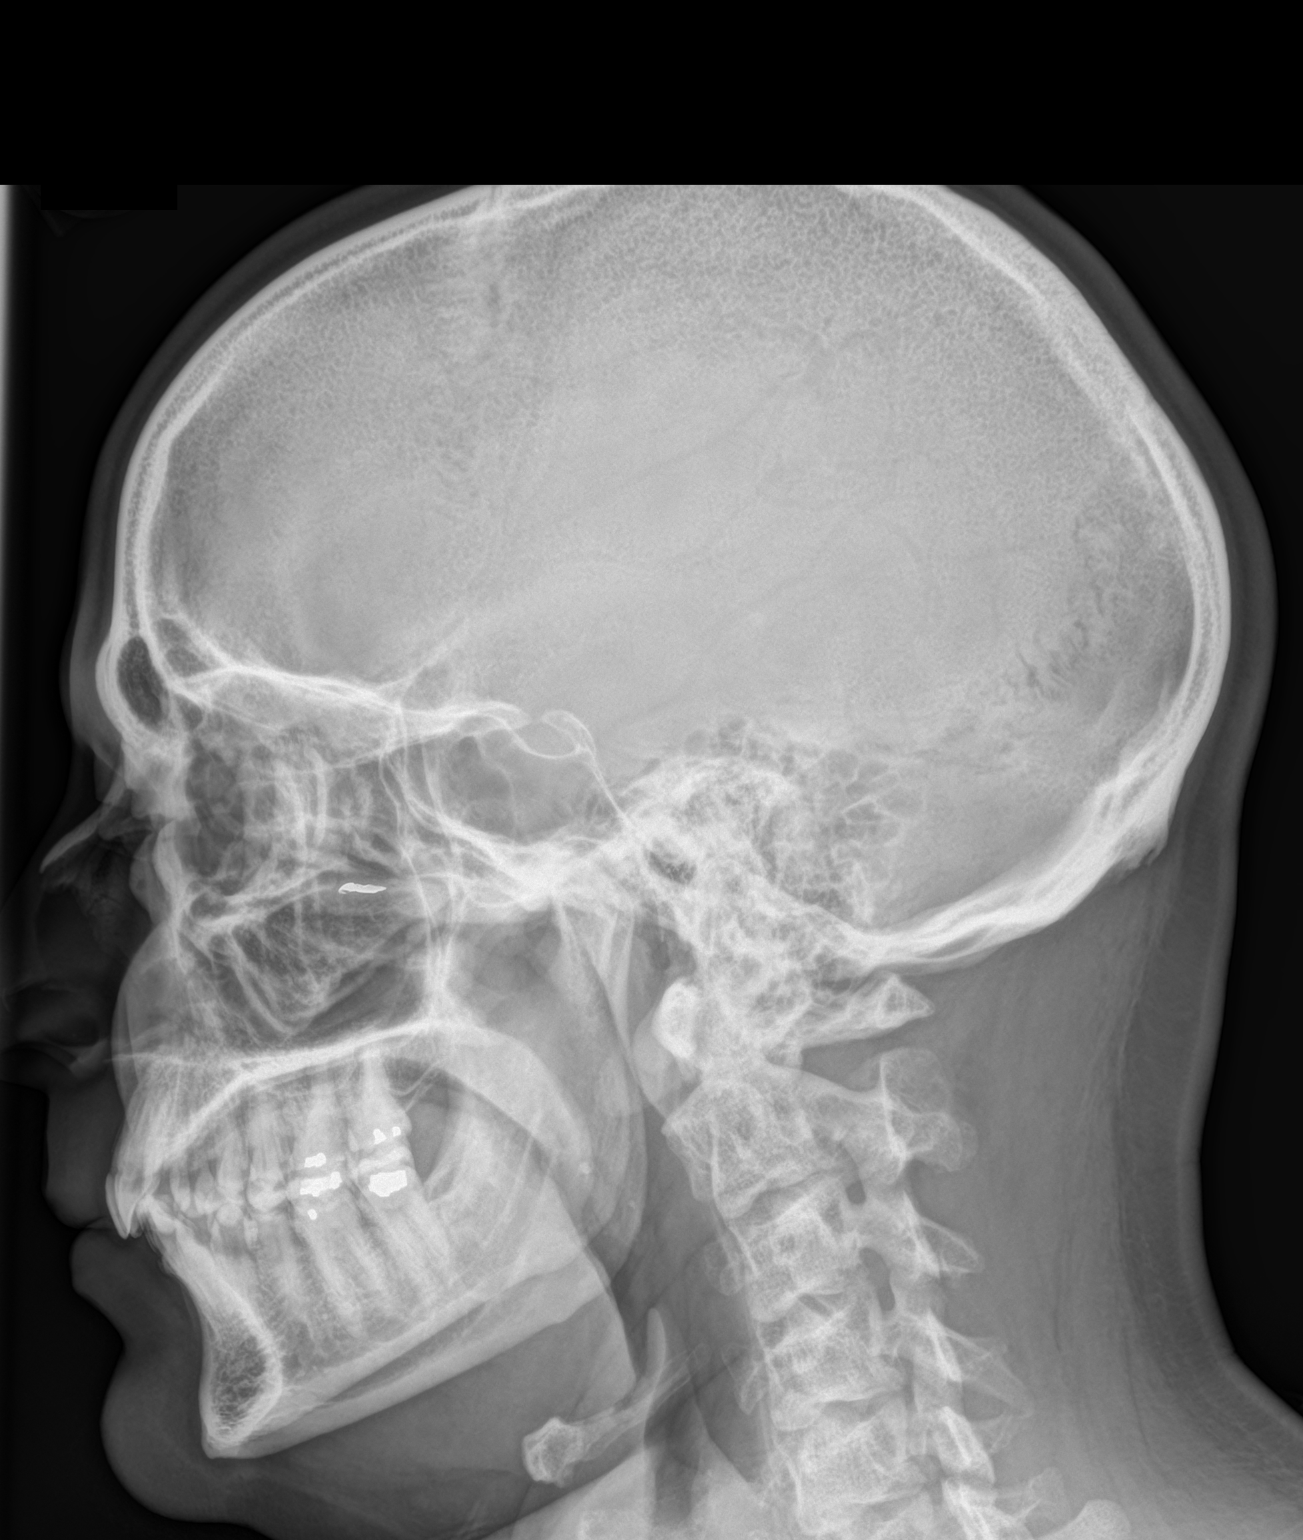

[2 of 2 positions shown; findings below may reference images not displayed]

FINDINGS: There is no evidence of fracture or other significant bone
abnormality. No orbital emphysema or sinus air-fluid levels are
seen. There is a 6 mm metallic fragment within the soft tissues
overlying the left zygomatic arch.
IMPRESSION: 6 mm metallic foreign body within the soft tissues overlying the
left zygomatic arch.

## 2019-08-20 ENCOUNTER — Other Ambulatory Visit: Payer: Self-pay

## 2019-08-20 ENCOUNTER — Other Ambulatory Visit (INDEPENDENT_AMBULATORY_CARE_PROVIDER_SITE_OTHER): Payer: BC Managed Care – PPO

## 2019-08-20 DIAGNOSIS — E119 Type 2 diabetes mellitus without complications: Secondary | ICD-10-CM | POA: Diagnosis not present

## 2019-08-20 DIAGNOSIS — E785 Hyperlipidemia, unspecified: Secondary | ICD-10-CM

## 2019-08-20 LAB — MICROALBUMIN / CREATININE URINE RATIO
Creatinine,U: 77.6 mg/dL
Microalb Creat Ratio: 21.4 mg/g (ref 0.0–30.0)
Microalb, Ur: 16.6 mg/dL — ABNORMAL HIGH (ref 0.0–1.9)

## 2019-08-20 LAB — COMPREHENSIVE METABOLIC PANEL
ALT: 31 U/L (ref 0–53)
AST: 19 U/L (ref 0–37)
Albumin: 5 g/dL (ref 3.5–5.2)
Alkaline Phosphatase: 55 U/L (ref 39–117)
BUN: 14 mg/dL (ref 6–23)
CO2: 27 mEq/L (ref 19–32)
Calcium: 9.8 mg/dL (ref 8.4–10.5)
Chloride: 102 mEq/L (ref 96–112)
Creatinine, Ser: 0.69 mg/dL (ref 0.40–1.50)
GFR: 124.68 mL/min (ref >60.00)
Glucose, Bld: 147 mg/dL — ABNORMAL HIGH (ref 70–99)
Potassium: 3.8 mEq/L (ref 3.5–5.1)
Sodium: 141 mEq/L (ref 135–145)
Total Bilirubin: 0.6 mg/dL (ref 0.2–1.2)
Total Protein: 7.3 g/dL (ref 6.0–8.3)

## 2019-08-20 LAB — LIPID PANEL
Cholesterol: 105 mg/dL (ref 0–200)
HDL: 36.5 mg/dL — ABNORMAL LOW (ref >39.00)
LDL Cholesterol: 51 mg/dL (ref 0–99)
NonHDL: 68.41
Total CHOL/HDL Ratio: 3
Triglycerides: 89 mg/dL (ref 0.0–149.0)
VLDL: 17.8 mg/dL (ref 0.0–40.0)

## 2019-08-20 LAB — HEMOGLOBIN A1C: Hgb A1c MFr Bld: 7.9 % — ABNORMAL HIGH (ref 4.6–6.5)

## 2019-08-23 ENCOUNTER — Other Ambulatory Visit: Payer: Self-pay

## 2019-08-23 ENCOUNTER — Ambulatory Visit (INDEPENDENT_AMBULATORY_CARE_PROVIDER_SITE_OTHER): Payer: BC Managed Care – PPO | Admitting: Internal Medicine

## 2019-08-23 ENCOUNTER — Other Ambulatory Visit: Payer: Self-pay | Admitting: Internal Medicine

## 2019-08-23 ENCOUNTER — Encounter: Payer: Self-pay | Admitting: Internal Medicine

## 2019-08-23 DIAGNOSIS — F411 Generalized anxiety disorder: Secondary | ICD-10-CM | POA: Diagnosis not present

## 2019-08-23 DIAGNOSIS — I10 Essential (primary) hypertension: Secondary | ICD-10-CM

## 2019-08-23 DIAGNOSIS — K760 Fatty (change of) liver, not elsewhere classified: Secondary | ICD-10-CM

## 2019-08-23 DIAGNOSIS — E1121 Type 2 diabetes mellitus with diabetic nephropathy: Secondary | ICD-10-CM | POA: Diagnosis not present

## 2019-08-23 DIAGNOSIS — E785 Hyperlipidemia, unspecified: Secondary | ICD-10-CM | POA: Diagnosis not present

## 2019-08-23 MED ORDER — BASAGLAR KWIKPEN 100 UNIT/ML ~~LOC~~ SOPN: 20.0000 [IU] | PEN_INJECTOR | Freq: Every day | SUBCUTANEOUS | 1 refills | Status: DC

## 2019-08-23 NOTE — Progress Notes (Signed)
Virtual Visit via [email or web address]  This visit type was conducted due to national recommendations for restrictions regarding the COVID-19 pandemic (e.g. social distancing).  This format is felt to be most appropriate for this patient at this time.  All issues noted in this document were discussed and addressed.  No physical exam was performed (except for noted visual exam findings with Video Visits).   I connected with@ on 08/23/19 at  4:30 PM EST by a video enabled telemedicine application and verified that I am speaking with the correct person using two identifiers. Location patient: home Location provider: work or home office Persons participating in the virtual visit: patient, provider  I discussed the limitations, risks, security and privacy concerns of performing an evaluation and management service by telephone and the availability of in person appointments. I also discussed with the patient that there may be a patient responsible charge related to this service. The patient expressed understanding and agreed to proceed.  Reason for visit: follow up   HPI:  44 yr old male with T2DM MANAGED with jardiance, metformin and Ozempic.  Patient reports loss of appetite on Oxempic,  Involuntary wt loss of 12 lbs since HIS last visit,  Ends up eating "comfort food" instead , resulting in post prandials ranging from 120 to 160 on days he eats right ,  To 180 to 210 on days he does not.   Having trouble getting Ozempic.  Prefers to start insulin .  Discussed trial of basaglar 20 units to start.    Hyperlipidemia  tolerating increased dose of atorvastatin   GAD:  Symptoms are managed , less stressful at work when kids are home so he is going to work.     ROS: See pertinent positives and negatives per HPI.  Past Medical History:  Diagnosis Date  . Cataract   . Diabetes mellitus 2005  . Hyperlipidemia   . Hypertension   . Other chronic nonalcoholic liver disease     Past Surgical History:   Procedure Laterality Date  . SPINE SURGERY     microdiskectomy    Family History  Problem Relation Age of Onset  . Diabetes Mother   . Hypertension Mother   . Hypertension Father   . Heart disease Father     SOCIAL HX:  reports that he quit smoking about 19 years ago. His smoking use included e-cigarettes. He quit smokeless tobacco use about 6 years ago.  His smokeless tobacco use included snuff. He reports current alcohol use of about 5.0 standard drinks of alcohol per week. He reports that he does not use drugs.   Current Outpatient Medications:  .  ALPRAZolam (XANAX) 1 MG tablet, TAKE 1 TABLET BY MOUTH ONCE DAILY AS NEEDED FOR SLEEP, Disp: 30 tablet, Rfl: 5 .  atorvastatin (LIPITOR) 40 MG tablet, Take 1 tablet (40 mg total) by mouth daily., Disp: 90 tablet, Rfl: 1 .  empagliflozin (JARDIANCE) 25 MG TABS tablet, Take 25 mg by mouth daily., Disp: 90 tablet, Rfl: 1 .  glucose blood test strip, Check sugar twice daily. One Touch Ultra strips. Dx: E11.65, Disp: 200 each, Rfl: 5 .  losartan (COZAAR) 25 MG tablet, Take 1 tablet (25 mg total) by mouth at bedtime., Disp: 90 tablet, Rfl: 1 .  naproxen sodium (ANAPROX) 220 MG tablet, Take 220 mg by mouth 2 (two) times daily with a meal., Disp: , Rfl:  .  Insulin Glargine (BASAGLAR KWIKPEN) 100 UNIT/ML SOPN, Inject 0.2 mLs (20 Units total) into the skin daily.,  Disp: 15 mL, Rfl: 1 .  metFORMIN (GLUCOPHAGE) 1000 MG tablet, Take 1 tablet by mouth twice daily, Disp: 180 tablet, Rfl: 0  EXAM:  VITALS per patient if applicable:  GENERAL: alert, oriented, appears well and in no acute distress  HEENT: atraumatic, conjunttiva clear, no obvious abnormalities on inspection of external nose and ears  NECK: normal movements of the head and neck  LUNGS: on inspection no signs of respiratory distress, breathing rate appears normal, no obvious gross SOB, gasping or wheezing  CV: no obvious cyanosis  MS: moves all visible extremities without  noticeable abnormality  PSYCH/NEURO: pleasant and cooperative, no obvious depression or anxiety, speech and thought processing grossly intact  ASSESSMENT AND PLAN:  Discussed the following assessment and plan:  Controlled type 2 diabetes mellitus with microalbuminuric diabetic nephropathy (HCC)  Essential hypertension  Hyperlipidemia with target LDL less than 70  Diabetes mellitus with microalbuminuric diabetic nephropathy (HCC) No benefit has been appreciated with use of ozempic for the last 6 months ,  With cost and negative effect on dietary compliance causing weight loss and post prandial elevations.  Changing to basal insulin,  continue jardiance and metformin.  Starting dose of Basaglar 20 units .  Advised to check sugars 1-2 tims daily at variable times and send readings via mychart  .  Continue losartan 25 mg daily for microalbuminuria  Lab Results  Component Value Date   HGBA1C 7.9 (H) 08/20/2019   Lab Results  Component Value Date   MICROALBUR 16.6 (H) 08/20/2019   MICROALBUR 5.7 (H) 05/13/2018       Hypertension Well controlled on current regimen based on home readints. Renal function stable, no changes today.  Hyperlipidemia with target LDL less than 70 LDL is now at  goal on current medications  liver enzymes are normal. No changes today.   Lab Results  Component Value Date   CHOL 105 08/20/2019   HDL 36.50 (L) 08/20/2019   LDLCALC 51 08/20/2019   LDLDIRECT 100.0 02/11/2019   TRIG 89.0 08/20/2019   CHOLHDL 3 08/20/2019   Lab Results  Component Value Date   ALT 31 08/20/2019   AST 19 08/20/2019   ALKPHOS 55 08/20/2019   BILITOT 0.6 08/20/2019       I discussed the assessment and treatment plan with the patient. The patient was provided an opportunity to ask questions and all were answered. The patient agreed with the plan and demonstrated an understanding of the instructions.   The patient was advised to call back or seek an in-person evaluation if  the symptoms worsen or if the condition fails to improve as anticipated.   [name], MD

## 2019-08-23 NOTE — Patient Instructions (Signed)
Stop the ozempic and start using Basaglar (basal insulin)   Once daily  (same time every day, you pick the time)  Starting dose 20 units  Continue Jardiance and Metformin .    Send me fastings and post prandials ("2 hours post meal) in 2-3 weeks

## 2019-08-24 NOTE — Assessment & Plan Note (Signed)
LDL is now at  goal on current medications  liver enzymes are normal. No changes today.   Lab Results  Component Value Date   CHOL 105 08/20/2019   HDL 36.50 (L) 08/20/2019   LDLCALC 51 08/20/2019   LDLDIRECT 100.0 02/11/2019   TRIG 89.0 08/20/2019   CHOLHDL 3 08/20/2019   Lab Results  Component Value Date   ALT 31 08/20/2019   AST 19 08/20/2019   ALKPHOS 55 08/20/2019   BILITOT 0.6 08/20/2019

## 2019-08-24 NOTE — Assessment & Plan Note (Signed)
Well controlled on current regimen based on home readints. Renal function stable, no changes today.

## 2019-08-24 NOTE — Assessment & Plan Note (Signed)
Managed with rare use of xanax. Did not tolerate Effexor trial. He has had to double the dose when used. The risks and benefits of benzodiazepine use were discussed with patient today including excessive sedation leading to respiratory depression,  impaired thinking/driving, and addiction.  Patient was advised to avoid concurrent use with alcohol, to use medication only as needed and not to share with others  .

## 2019-08-24 NOTE — Assessment & Plan Note (Addendum)
No benefit has been appreciated with use of ozempic for the last 6 months ,  With cost and negative effect on dietary compliance causing weight loss and post prandial elevations.  Changing to basal insulin,  continue jardiance and metformin.  Starting dose of Basaglar 20 units .  Advised to check sugars 1-2 tims daily at variable times and send readings via mychart  .  Continue losartan 25 mg daily for microalbuminuria  Lab Results  Component Value Date   HGBA1C 7.9 (H) 08/20/2019   Lab Results  Component Value Date   MICROALBUR 16.6 (H) 08/20/2019   MICROALBUR 5.7 (H) 05/13/2018

## 2019-08-24 NOTE — Assessment & Plan Note (Signed)
Managed with statin, low glycemic index  and control of diabetes,  Liver enzymes remain  Normal.    Lab Results  Component Value Date   ALT 31 08/20/2019   AST 19 08/20/2019   ALKPHOS 55 08/20/2019   BILITOT 0.6 08/20/2019

## 2019-09-07 ENCOUNTER — Other Ambulatory Visit: Payer: Self-pay | Admitting: Internal Medicine

## 2019-09-07 MED ORDER — GLIPIZIDE 5 MG PO TABS: 2.5000 mg | ORAL_TABLET | Freq: Every day | ORAL | 2 refills | Status: DC

## 2019-09-13 ENCOUNTER — Telehealth: Payer: BC Managed Care – PPO

## 2019-09-13 ENCOUNTER — Ambulatory Visit: Payer: BC Managed Care – PPO | Admitting: Pharmacist

## 2019-09-13 DIAGNOSIS — E1121 Type 2 diabetes mellitus with diabetic nephropathy: Secondary | ICD-10-CM

## 2019-09-13 NOTE — Patient Instructions (Signed)
Visit Information  Goals Addressed            This Visit's Progress     Patient Stated   . "I want to improve my sugars" (pt-stated)       Current Barriers:  . Diabetes: uncontrolled; most recent A1c 7.9%   o No appreciable benefit w/ Ozempic, and resulted in poor appetite so eating more carbohydrate-rich foods. In the interm, started on Basaglar by Dr. Derrel Nip. Post prandials remained elevated, so started on glipizide w/supper . Current antihyperglycemic regimen: metformin 1000 mg BID, Jardiance 25 mg daily, Basaglar 20 units daily, glipizide 2.5 mg w/ supper  o Previous hx DPP4 w/ no benefit, Ozempic w/ no benefit  . Denies exercise right now; but gets about 15,000 steps daily at work (works at [employer]). Notes he would like to engage in more exercise, but doesn't have time with work. . Denies hypoglycemia  . Current blood glucose readings (s/p glipizide addition): o Fasting: 95-115s o Friday: had some chips, after supper was 80  o Saturday: had some macaroni, after supper was 110-115 o Sunday: had some rice; after supper was 155  . Cardiovascular risk reduction: o Current hypertensive regimen: losartan 25 mg daily o Current hyperlipidemia regimen: atorvastatin 40 mg daily; last LDL at goal <70  Pharmacist Clinical Goal(s):  [name] Over the next 90 days, patient will work with PharmD and primary care provider to address optimized glycemic benefit  Interventions: . Comprehensive medication review performed, medication list updated in electronic medical record . Reviewed goal A1c, goal fasting, and goal 2 hour post prandial glucose readings. Appears current regimen is keeping patient well controlled. Will continue and evaluate in ~4 weeks . Counseled on risk of hypoglycemia w/ glipizide. [name] Up to date on refills for statin and RAAS.  [name] Encouraged to consider after work or weekend exercise for cardiovascular health benefit.   Patient Self Care Activities:  . Patient will check blood  glucose BID , document, and provide at future appointments . Patient will take medications as prescribed . Patient will report any questions or concerns to provider   Please see past updates related to this goal by clicking on the "Past Updates" button in the selected goal         The patient verbalized understanding of instructions provided today and declined a print copy of patient instruction materials.   Plan:  - Scheduled f/u call 10/14/19  [name], PharmD, BCACP, CPP Clinical Pharmacist [place] [phone]

## 2019-09-13 NOTE — Chronic Care Management (AMB) (Signed)
Chronic Care Management   Follow Up Note   09/13/2019 Name: Andrew Molina MRN: [id number] DOB: August 20, 1975  Referred by: [name], MD Reason for referral : Chronic Care Management (Medication Management)   Andrew Molina is a 44 y.o. year old male who is a primary care patient of [name], [name], MD. The CCM team was consulted for assistance with chronic disease management and care coordination needs.    Contacted patient for medication management review.   Review of patient status, including review of consultants reports, relevant laboratory and other test results, and collaboration with appropriate care team members and the patient's provider was performed as part of comprehensive patient evaluation and provision of chronic care management services.    SDOH (Social Determinants of Health) screening performed today: Physical Activity. See Care Plan for related entries.   Outpatient Encounter Medications as of 09/13/2019  Medication Sig Note  . ALPRAZolam (XANAX) 1 MG tablet TAKE 1 TABLET BY MOUTH ONCE DAILY AS NEEDED FOR SLEEP 06/14/2019: Using very rarely  . atorvastatin (LIPITOR) 40 MG tablet Take 1 tablet (40 mg total) by mouth daily.   . empagliflozin (JARDIANCE) 25 MG TABS tablet Take 25 mg by mouth daily.   [name] glipiZIDE (GLUCOTROL) 5 MG tablet Take 0.5 tablets (2.5 mg total) by mouth daily before supper.   [name] glucose blood test strip Check sugar twice daily. One Touch Ultra strips. Dx: E11.65   . Insulin Glargine (BASAGLAR KWIKPEN) 100 UNIT/ML SOPN Inject 0.2 mLs (20 Units total) into the skin daily.   [name] losartan (COZAAR) 25 MG tablet Take 1 tablet (25 mg total) by mouth at bedtime.   . metFORMIN (GLUCOPHAGE) 1000 MG tablet Take 1 tablet by mouth twice daily   . naproxen sodium (ANAPROX) 220 MG tablet Take 220 mg by mouth 2 (two) times daily with a meal. 06/14/2019: QAM   No facility-administered encounter medications on file as of 09/13/2019.     Objective:   Goals Addressed              This Visit's Progress     Patient Stated   . "I want to improve my sugars" (pt-stated)       Current Barriers:  . Diabetes: uncontrolled; most recent A1c 7.9%   o No appreciable benefit w/ Ozempic, and resulted in poor appetite so eating more carbohydrate-rich foods. In the interm, started on Basaglar by Dr. Derrel Nip. Post prandials remained elevated, so started on glipizide w/supper . Current antihyperglycemic regimen: metformin 1000 mg BID, Jardiance 25 mg daily, Basaglar 20 units daily, glipizide 2.5 mg w/ supper  o Previous hx DPP4 w/ no benefit, Ozempic w/ no benefit  . Denies exercise right now; but gets about 15,000 steps daily at work (works at [employer]). Notes he would like to engage in more exercise, but doesn't have time with work. . Denies hypoglycemia  . Current blood glucose readings (s/p glipizide addition): o Fasting: 95-115s o Friday: had some chips, after supper was 80  o Saturday: had some macaroni, after supper was 110-115 o Sunday: had some rice; after supper was 155  . Cardiovascular risk reduction: o Current hypertensive regimen: losartan 25 mg daily o Current hyperlipidemia regimen: atorvastatin 40 mg daily; last LDL at goal <70  Pharmacist Clinical Goal(s):  [name] Over the next 90 days, patient will work with PharmD and primary care provider to address optimized glycemic benefit  Interventions: . Comprehensive medication review performed, medication list updated in electronic medical record .  Reviewed goal A1c, goal fasting, and goal 2 hour post prandial glucose readings. Appears current regimen is keeping patient well controlled. Will continue and evaluate in ~4 weeks . Counseled on risk of hypoglycemia w/ glipizide. [name] Up to date on refills for statin and RAAS.  [name] Encouraged to consider after work or weekend exercise for cardiovascular health benefit.   Patient Self Care Activities:  . Patient will check blood glucose BID , document, and provide at  future appointments . Patient will take medications as prescribed . Patient will report any questions or concerns to provider   Please see past updates related to this goal by clicking on the "Past Updates" button in the selected goal          Plan:  - Scheduled f/u call 10/14/19  [name], PharmD, BCACP, [place] Pharmacist [place] [phone]

## 2019-09-24 ENCOUNTER — Other Ambulatory Visit: Payer: Self-pay | Admitting: Internal Medicine

## 2019-10-05 ENCOUNTER — Other Ambulatory Visit: Payer: Self-pay | Admitting: Internal Medicine

## 2019-10-14 ENCOUNTER — Ambulatory Visit: Payer: BC Managed Care – PPO | Admitting: Pharmacist

## 2019-10-14 DIAGNOSIS — E1121 Type 2 diabetes mellitus with diabetic nephropathy: Secondary | ICD-10-CM

## 2019-10-14 NOTE — Chronic Care Management (AMB) (Signed)
Chronic Care Management   Follow Up Note   10/14/2019 Name: Andrew Molina MRN: [id number] DOB: [birth year]  Referred by: Andrew Mc, MD Reason for referral : Chronic Care Management (Medication Management)   Andrew Molina is a 44 y.o. year old male who is a primary care patient of [name], [name], MD. The CCM team was consulted for assistance with chronic disease management and care coordination needs.    Contacted patient for medication management review.   Review of patient status, including review of consultants reports, relevant laboratory and other test results, and collaboration with appropriate care team members and the patient's provider was performed as part of comprehensive patient evaluation and provision of chronic care management services.    SDOH (Social Determinants of Health) assessments performed: No See Care Plan activities for detailed interventions related to [facility])     Outpatient Encounter Medications as of 10/14/2019  Medication Sig Note  . atorvastatin (LIPITOR) 40 MG tablet Take 1 tablet (40 mg total) by mouth daily.   [name] glipiZIDE (GLUCOTROL) 5 MG tablet Take 0.5 tablets (2.5 mg total) by mouth daily before supper.   . Insulin Glargine (BASAGLAR KWIKPEN) 100 UNIT/ML SOPN Inject 0.2 mLs (20 Units total) into the skin daily.   [name] JARDIANCE 25 MG TABS tablet Take 1 tablet by mouth once daily   . losartan (COZAAR) 25 MG tablet TAKE 1 TABLET BY MOUTH AT BEDTIME   . metFORMIN (GLUCOPHAGE) 1000 MG tablet Take 1 tablet by mouth twice daily   . ALPRAZolam (XANAX) 1 MG tablet TAKE 1 TABLET BY MOUTH ONCE DAILY AS NEEDED FOR SLEEP (Patient not taking: Reported on 10/14/2019) 06/14/2019: Using very rarely  . glucose blood test strip Check sugar twice daily. One Touch Ultra strips. Dx: E11.65   . naproxen sodium (ANAPROX) 220 MG tablet Take 220 mg by mouth 2 (two) times daily with a meal. 06/14/2019: QAM   No facility-administered encounter medications on file as of 10/14/2019.     Objective:   Goals Addressed            This Visit's Progress     Patient Stated   . "I want to improve my sugars" (pt-stated)       CARE PLAN ENTRY (see longtitudinal plan of care for additional care plan information)  Current Barriers:  . Diabetes: uncontrolled; most recent A1c 7.9%   o Notes that he continues to do well on this regimen.  . Current antihyperglycemic regimen: metformin 1000 mg BID, Jardiance 25 mg daily, Basaglar 20 units daily, glipizide 2.5 mg w/ supper  o Previous hx DPP4 w/ no benefit, Ozempic w/ no benefit  . Very few episodes of hypoglycemia, happens if he takes glipizide and eats a smaller meal . Current blood glucose readings (s/p glipizide addition): o Fasting: 100s, occasional excursion to 130 o 2 hour post prandial; generally 130-150s; excursion to 200 . Cardiovascular risk reduction: o Current hypertensive regimen: losartan 25 mg daily o Current hyperlipidemia regimen: atorvastatin 40 mg daily; last LDL at goal <70  Pharmacist Clinical Goal(s):  [name] Over the next 90 days, patient will work with PharmD and primary care provider to address optimized glycemic benefit  Interventions: . Agree to continue current regimen. Will be due for A1c after 11/18/19. Have ordered, patient will call and schedule labwork.  . Counseled to skip glipizide if he doesn't eat, or eats a low carbohydrate meal. . Reviewed goal A1c, goal fasting, and goal 2 hour post prandial glucose readings.  Patient will contact me if he develops consistent hypoglycemia for medication adjustment.   Patient Self Care Activities:  . Patient will check blood glucose BID , document, and provide at future appointments . Patient will take medications as prescribed . Patient will report any questions or concerns to provider   Please see past updates related to this goal by clicking on the "Past Updates" button in the selected goal          Plan:  - Scheduled f/u call 12/09/19  Catie  Darnelle Maffucci, PharmD, BCACP, [place] Pharmacist [place] [phone]

## 2019-10-14 NOTE — Patient Instructions (Signed)
Visit Information  Goals Addressed            This Visit's Progress     Patient Stated   . "I want to improve my sugars" (pt-stated)       CARE PLAN ENTRY (see longtitudinal plan of care for additional care plan information)  Current Barriers:  . Diabetes: uncontrolled; most recent A1c 7.9%   o Notes that he continues to do well on this regimen.  . Current antihyperglycemic regimen: metformin 1000 mg BID, Jardiance 25 mg daily, Basaglar 20 units daily, glipizide 2.5 mg w/ supper  o Previous hx DPP4 w/ no benefit, Ozempic w/ no benefit  . Very few episodes of hypoglycemia, happens if he takes glipizide and eats a smaller meal . Current blood glucose readings (s/p glipizide addition): o Fasting: 100s, occasional excursion to 130 o 2 hour post prandial; generally 130-150s; excursion to 200 . Cardiovascular risk reduction: o Current hypertensive regimen: losartan 25 mg daily o Current hyperlipidemia regimen: atorvastatin 40 mg daily; last LDL at goal <70  Pharmacist Clinical Goal(s):  [name] Over the next 90 days, patient will work with PharmD and primary care provider to address optimized glycemic benefit  Interventions: . Agree to continue current regimen. Will be due for A1c after 11/18/19. Have ordered, patient will call and schedule labwork.  . Counseled to skip glipizide if he doesn't eat, or eats a low carbohydrate meal. . Reviewed goal A1c, goal fasting, and goal 2 hour post prandial glucose readings. Patient will contact me if he develops consistent hypoglycemia for medication adjustment.   Patient Self Care Activities:  . Patient will check blood glucose BID , document, and provide at future appointments . Patient will take medications as prescribed . Patient will report any questions or concerns to provider   Please see past updates related to this goal by clicking on the "Past Updates" button in the selected goal         Patient verbalizes understanding of instructions  provided today.   Plan:  - Scheduled f/u call 12/09/19  [name], PharmD, BCACP, CPP Clinical Pharmacist [place] [phone]

## 2019-10-19 DIAGNOSIS — E1121 Type 2 diabetes mellitus with diabetic nephropathy: Secondary | ICD-10-CM

## 2019-10-21 NOTE — Telephone Encounter (Signed)
Spoke with pt and scheduled him both a lab appt and a follow up appt. Labs have been ordered.

## 2019-11-21 ENCOUNTER — Other Ambulatory Visit: Payer: Self-pay | Admitting: Internal Medicine

## 2019-11-22 ENCOUNTER — Other Ambulatory Visit: Payer: Self-pay

## 2019-11-22 ENCOUNTER — Other Ambulatory Visit (INDEPENDENT_AMBULATORY_CARE_PROVIDER_SITE_OTHER): Payer: BC Managed Care – PPO

## 2019-11-22 DIAGNOSIS — E1121 Type 2 diabetes mellitus with diabetic nephropathy: Secondary | ICD-10-CM

## 2019-11-22 LAB — LIPID PANEL
Cholesterol: 144 mg/dL (ref 0–200)
HDL: 40.7 mg/dL (ref >39.00)
LDL Cholesterol: 85 mg/dL (ref 0–99)
NonHDL: 103.76
Total CHOL/HDL Ratio: 4
Triglycerides: 95 mg/dL (ref 0.0–149.0)
VLDL: 19 mg/dL (ref 0.0–40.0)

## 2019-11-22 LAB — COMPREHENSIVE METABOLIC PANEL
ALT: 36 U/L (ref 0–53)
AST: 24 U/L (ref 0–37)
Albumin: 4.9 g/dL (ref 3.5–5.2)
Alkaline Phosphatase: 52 U/L (ref 39–117)
BUN: 17 mg/dL (ref 6–23)
CO2: 29 mEq/L (ref 19–32)
Calcium: 9.6 mg/dL (ref 8.4–10.5)
Chloride: 102 mEq/L (ref 96–112)
Creatinine, Ser: 0.65 mg/dL (ref 0.40–1.50)
GFR: 133.42 mL/min (ref >60.00)
Glucose, Bld: 128 mg/dL — ABNORMAL HIGH (ref 70–99)
Potassium: 4.1 mEq/L (ref 3.5–5.1)
Sodium: 141 mEq/L (ref 135–145)
Total Bilirubin: 0.6 mg/dL (ref 0.2–1.2)
Total Protein: 6.8 g/dL (ref 6.0–8.3)

## 2019-11-22 NOTE — Addendum Note (Signed)
Addended byElpidio Galea T on: 11/22/2019 08:19 AM   Modules accepted: Orders

## 2019-11-24 ENCOUNTER — Other Ambulatory Visit: Payer: Self-pay | Admitting: Internal Medicine

## 2019-11-24 DIAGNOSIS — E1121 Type 2 diabetes mellitus with diabetic nephropathy: Secondary | ICD-10-CM

## 2019-11-25 ENCOUNTER — Ambulatory Visit: Payer: BC Managed Care – PPO | Admitting: Internal Medicine

## 2019-11-29 ENCOUNTER — Other Ambulatory Visit: Payer: Self-pay

## 2019-11-29 ENCOUNTER — Other Ambulatory Visit (INDEPENDENT_AMBULATORY_CARE_PROVIDER_SITE_OTHER): Payer: BC Managed Care – PPO

## 2019-11-29 DIAGNOSIS — E1121 Type 2 diabetes mellitus with diabetic nephropathy: Secondary | ICD-10-CM

## 2019-11-30 LAB — HEMOGLOBIN A1C: Hgb A1c MFr Bld: 7.5 % — ABNORMAL HIGH (ref 4.6–6.5)

## 2019-12-02 ENCOUNTER — Telehealth: Payer: Self-pay | Admitting: Internal Medicine

## 2019-12-02 NOTE — Chronic Care Management (AMB) (Signed)
  Care Management   Note  12/02/2019 Name: Andrew Molina MRN: [id number] DOB: 24-Feb-1976  [name] is a 44 y.o. year old male who is a primary care patient of Derrel Nip, [name], MD and is actively engaged with the care management team. I reached out to [name] by phone today to assist with re-scheduling a follow up visit with the Pharmacist  Follow up plan: Unsuccessful telephone outreach attempt made. A HIPPA compliant phone message was left for the patient providing contact information and requesting a return call.  The care management team will reach out to the patient again over the next 7 days.  If patient returns call to provider office, please advise to call [facility]  at [place], [place], [place], [place] [address] Direct Dial: [phone] [email or web address] Website: [email or web address]

## 2019-12-07 ENCOUNTER — Encounter: Payer: Self-pay | Admitting: Internal Medicine

## 2019-12-07 ENCOUNTER — Other Ambulatory Visit: Payer: Self-pay

## 2019-12-07 ENCOUNTER — Ambulatory Visit: Payer: BC Managed Care – PPO | Admitting: Internal Medicine

## 2019-12-07 VITALS — BP 114/70 | HR 78 | Temp 97.7°F | Resp 15 | Ht 70.0 in | Wt 168.8 lb

## 2019-12-07 DIAGNOSIS — E1169 Type 2 diabetes mellitus with other specified complication: Secondary | ICD-10-CM

## 2019-12-07 DIAGNOSIS — E1121 Type 2 diabetes mellitus with diabetic nephropathy: Secondary | ICD-10-CM

## 2019-12-07 DIAGNOSIS — K760 Fatty (change of) liver, not elsewhere classified: Secondary | ICD-10-CM

## 2019-12-07 DIAGNOSIS — F411 Generalized anxiety disorder: Secondary | ICD-10-CM

## 2019-12-07 DIAGNOSIS — Z3009 Encounter for other general counseling and advice on contraception: Secondary | ICD-10-CM | POA: Diagnosis not present

## 2019-12-07 DIAGNOSIS — E785 Hyperlipidemia, unspecified: Secondary | ICD-10-CM

## 2019-12-07 NOTE — Assessment & Plan Note (Signed)
LDL is now at  goal on current medications  liver enzymes are normal. No changes today.   Lab Results  Component Value Date   CHOL 144 11/22/2019   HDL 40.70 11/22/2019   LDLCALC 85 11/22/2019   LDLDIRECT 100.0 02/11/2019   TRIG 95.0 11/22/2019   CHOLHDL 4 11/22/2019   Lab Results  Component Value Date   ALT 36 11/22/2019   AST 24 11/22/2019   ALKPHOS 52 11/22/2019   BILITOT 0.6 11/22/2019

## 2019-12-07 NOTE — Patient Instructions (Signed)
You're close !    Check 2 hr post dinnertime sugars for a week.  Increase glipizide dose to 5 mg if all sugars are > 170.  If only some are,  Then adjust your dose based on whether the meal has > 30 carbs (corn, chicken pie,  Lasagna, etc)  Urology referral in process  See you in 3 months.  labs due after July 26

## 2019-12-07 NOTE — Assessment & Plan Note (Signed)
Managed with statin, low glycemic index  and control of diabetes,  Liver enzymes remain  Normal.    Lab Results  Component Value Date   ALT 36 11/22/2019   AST 24 11/22/2019   ALKPHOS 52 11/22/2019   BILITOT 0.6 11/22/2019

## 2019-12-07 NOTE — Assessment & Plan Note (Signed)
Improved control but not at goal..  Advised to monitor post prandial dinner sugars and increase glipizide to 5 mg for goal CBG 160 or less .  Lab Results  Component Value Date   HGBA1C 7.5 (H) 11/29/2019   Lab Results  Component Value Date   MICROALBUR 16.6 (H) 08/20/2019   MICROALBUR 5.7 (H) 05/13/2018

## 2019-12-07 NOTE — [place] Notes (Signed)
Subjective:  Patient ID: Andrew Molina, male    DOB: 1975/10/07  Age: 44 y.o. MRN: [id number]  CC: The primary encounter diagnosis was Diabetes mellitus with microalbuminuric diabetic nephropathy ([place]). Diagnoses of Screening and evaluation for vasectomy, Hyperlipidemia associated with type 2 diabetes mellitus ([place]), NAFLD (nonalcoholic fatty liver disease), and Generalized anxiety disorder were also pertinent to this visit.  HPI Andrew Molina presents for follow up on type 2 DM,  Fatty liver, hypertension   This visit occurred during the SARS-CoV-2 public health emergency.  Safety protocols were in place, including screening questions prior to the visit, additional usage of staff PPE, and extensive cleaning of exam room while observing appropriate contact time as indicated for disinfecting solutions.    Patient has received NO  doses of the available COVID 19 vaccines.   Patient continues to mask when outside of the home except when walking in yard or at safe distances from others .  Patient denies any change in mood or development of unhealthy behaviors resuting from the pandemic's restriction of activities and socialization.  Type 2 DM:  Last a1c improved from 7.9 to 7.5  . He admits that he "can do better " with his diet. He is taking 2.5 mg glipizide at dinner and notes that his post prandials have ranges from 160 to 190 depending on his dinner.   Wants vasectomy referral wants male urology       Outpatient Medications Prior to Visit  Medication Sig Dispense Refill  . ALPRAZolam (XANAX) 1 MG tablet TAKE 1 TABLET BY MOUTH ONCE DAILY AS NEEDED FOR SLEEP 30 tablet 5  . atorvastatin (LIPITOR) 40 MG tablet Take 1 tablet (40 mg total) by mouth daily. 90 tablet 1  . glipiZIDE (GLUCOTROL) 5 MG tablet Take 0.5 tablets (2.5 mg total) by mouth daily before supper. 45 tablet 2  . glucose blood test strip Check sugar twice daily. One Touch Ultra strips. Dx: E11.65 200 each 5  . Insulin Glargine  (BASAGLAR KWIKPEN) 100 UNIT/ML SOPN Inject 0.2 mLs (20 Units total) into the skin daily. 15 mL 1  . JARDIANCE 25 MG TABS tablet Take 1 tablet by mouth once daily 90 tablet 0  . losartan (COZAAR) 25 MG tablet TAKE 1 TABLET BY MOUTH AT BEDTIME 90 tablet 0  . metFORMIN (GLUCOPHAGE) 1000 MG tablet Take 1 tablet by mouth twice daily 180 tablet 0  . naproxen sodium (ANAPROX) 220 MG tablet Take 220 mg by mouth 2 (two) times daily with a meal.     No facility-administered medications prior to visit.    Review of Systems;  Patient denies headache, fevers, malaise, unintentional weight loss, skin rash, eye pain, sinus congestion and sinus pain, sore throat, dysphagia,  hemoptysis , cough, dyspnea, wheezing, chest pain, palpitations, orthopnea, edema, abdominal pain, nausea, melena, diarrhea, constipation, flank pain, dysuria, hematuria, urinary  Frequency, nocturia, numbness, tingling, seizures,  Focal weakness, Loss of consciousness,  Tremor, insomnia, depression, anxiety, and suicidal ideation.      Objective:  BP 114/70 (BP Location: Left Arm, Patient Position: Sitting, Cuff Size: Normal)   Pulse 78   Temp 97.7 F (36.5 C) (Temporal)   Resp 15   Ht 5' 10"  (1.778 m)   Wt 168 lb 12.8 oz (76.6 kg)   SpO2 98%   BMI 24.22 kg/m   BP Readings from Last 3 Encounters:  12/07/19 114/70  02/18/19 (!) 144/81  08/13/18 102/70    Wt Readings from Last 3 Encounters:  12/07/19  168 lb 12.8 oz (76.6 kg)  08/23/19 161 lb (73 kg)  05/21/19 171 lb (77.6 kg)    General appearance: alert, cooperative and appears stated age Ears: normal TM's and external ear canals both ears Throat: lips, mucosa, and tongue normal; teeth and gums normal Neck: no adenopathy, no carotid bruit, supple, symmetrical, trachea midline and thyroid not enlarged, symmetric, no tenderness/mass/nodules Back: symmetric, no curvature. ROM normal. No CVA tenderness. Lungs: clear to auscultation bilaterally Heart: regular rate and  rhythm, S1, S2 normal, no murmur, click, rub or gallop Abdomen: soft, non-tender; bowel sounds normal; no masses,  no organomegaly Pulses: 2+ and symmetric Skin: Skin color, texture, turgor normal. No rashes or lesions Lymph nodes: Cervical, supraclavicular, and axillary nodes normal.  Lab Results  Component Value Date   HGBA1C 7.5 (H) 11/29/2019   HGBA1C 7.9 (H) 08/20/2019   HGBA1C 7.9 (H) 05/19/2019    Lab Results  Component Value Date   CREATININE 0.65 11/22/2019   CREATININE 0.69 08/20/2019   CREATININE 0.68 05/19/2019    Lab Results  Component Value Date   WBC 6.6 02/27/2016   HGB 13.4 02/27/2016   HCT 39.1 02/27/2016   PLT 250.0 02/27/2016   GLUCOSE 128 (H) 11/22/2019   CHOL 144 11/22/2019   TRIG 95.0 11/22/2019   HDL 40.70 11/22/2019   LDLDIRECT 100.0 02/11/2019   LDLCALC 85 11/22/2019   ALT 36 11/22/2019   AST 24 11/22/2019   NA 141 11/22/2019   K 4.1 11/22/2019   CL 102 11/22/2019   CREATININE 0.65 11/22/2019   BUN 17 11/22/2019   CO2 29 11/22/2019   TSH 1.92 08/14/2016   HGBA1C 7.5 (H) 11/29/2019   MICROALBUR 16.6 (H) 08/20/2019    DG Facial Bones 1-2 Views  Result Date: 02/18/2019 CLINICAL DATA:  Foreign body post injury 1.5 months ago. EXAM: FACIAL BONES - 1-2 VIEW COMPARISON:  None. FINDINGS: There is no evidence of fracture or other significant bone abnormality. No orbital emphysema or sinus air-fluid levels are seen. There is a 6 mm metallic fragment within the soft tissues overlying the left zygomatic arch. IMPRESSION: 6 mm metallic foreign body within the soft tissues overlying the left zygomatic arch. Electronically Signed   By: Fidela Salisbury M.D.   On: 02/18/2019 14:33    Assessment & Plan:   Problem List Items Addressed This Visit      Unprioritized   Diabetes mellitus with microalbuminuric diabetic nephropathy ([place]) - Primary    Improved control but not at goal..  Advised to monitor post prandial dinner sugars and increase glipizide  to 5 mg for goal CBG 160 or less .  Lab Results  Component Value Date   HGBA1C 7.5 (H) 11/29/2019   Lab Results  Component Value Date   MICROALBUR 16.6 (H) 08/20/2019   MICROALBUR 5.7 (H) 05/13/2018           Relevant Orders   Comprehensive metabolic panel   Hemoglobin A1c   Generalized anxiety disorder    Managed with rare use of xanax. Did not tolerate Effexor trial. He has had to double the dose when used. The risks and benefits of benzodiazepine use were discussed with patient today including excessive sedation leading to respiratory depression,  impaired thinking/driving, and addiction.  Patient was advised to avoid concurrent use with alcohol, to use medication only as needed and not to share with others  .       Hyperlipidemia associated with type 2 diabetes mellitus ([facility])    LDL  is now at  goal on current medications  liver enzymes are normal. No changes today.   Lab Results  Component Value Date   CHOL 144 11/22/2019   HDL 40.70 11/22/2019   LDLCALC 85 11/22/2019   LDLDIRECT 100.0 02/11/2019   TRIG 95.0 11/22/2019   CHOLHDL 4 11/22/2019   Lab Results  Component Value Date   ALT 36 11/22/2019   AST 24 11/22/2019   ALKPHOS 52 11/22/2019   BILITOT 0.6 11/22/2019         NAFLD (nonalcoholic fatty liver disease)    Managed with statin, low glycemic index  and control of diabetes,  Liver enzymes remain  Normal.    Lab Results  Component Value Date   ALT 36 11/22/2019   AST 24 11/22/2019   ALKPHOS 52 11/22/2019   BILITOT 0.6 11/22/2019         Other Visit Diagnoses    Screening and evaluation for vasectomy       Relevant Orders   Ambulatory referral to Urology      I am having Andrew Molina maintain his naproxen sodium, glucose blood, atorvastatin, ALPRAZolam, Basaglar KwikPen, glipiZIDE, losartan, Jardiance, and metFORMIN.  No orders of the defined types were placed in this encounter.  A total of 0inutes of face to face time was spent with  patient more than half of which was spent in counselling about the above mentioned conditions  and coordination of care   There are no discontinued medications.  Follow-up: Return in about 3 months (around 03/08/2020) for follow up diabetes.   [name], MD

## 2019-12-07 NOTE — Assessment & Plan Note (Signed)
Managed with rare use of xanax. Did not tolerate Effexor trial. He has had to double the dose when used. The risks and benefits of benzodiazepine use were discussed with patient today including excessive sedation leading to respiratory depression,  impaired thinking/driving, and addiction.  Patient was advised to avoid concurrent use with alcohol, to use medication only as needed and not to share with others  .

## 2019-12-07 NOTE — Chronic Care Management (AMB) (Signed)
  Care Management   Note  12/07/2019 Name: Andrew Molina MRN: [id number] DOB: [birth year]  Andrew Molina is a 44 y.o. year old male who is a primary care patient of Derrel Nip, [name], MD and is actively engaged with the care management team. I reached out to [name] by phone today to assist with re-scheduling a follow up visit with the Pharmacist  Follow up plan: Second Unsuccessful telephone outreach attempt made. A HIPPA compliant phone message was left for the patient providing contact information and requesting a return call.  The care management team will reach out to the patient again over the next 7 days.  If patient returns call to provider office, please advise to call [facility]  at [place] [place], [place], [place], [place] [address] Direct Dial: [phone] [email or web address]: [email or web address]

## 2019-12-07 NOTE — Chronic Care Management (AMB) (Signed)
  Care Management   Note  12/07/2019 Name: KEYONTE COOKSTON MRN: [id number] DOB: April 16, 1976  PARKE JANDREAU is a 44 y.o. year old male who is a primary care patient of Derrel Nip, [name], MD and is actively engaged with the care management team. I reached out to [name] by phone today to assist with re-scheduling a follow up visit with the Pharmacist  Follow up plan: Telephone appointment with care management team member scheduled for:01/13/2020  Noreene Larsson, [place], [place], [place] [address] Direct Dial: [phone] [email or web address]: [email or web address]

## 2019-12-09 ENCOUNTER — Telehealth: Payer: BC Managed Care – PPO

## 2019-12-28 NOTE — Progress Notes (Signed)
12/29/19 3:23 PM   Andrew Molina [birth year] [id number]  Referring provider: [name], MD [place],  [place] [address]  Chief Complaint  Patient presents with  . VAS Consult    HPI: 44 y.o. year old male referred for further evaluation of possible vasectomy.  Denies a history of testicular trauma or pain.  No urinary issues.  No previous scrotal surgeries.  He had an accident when he was 39 or 44 yo where he fell and his testicles swelled up. He was followed up w/ a hernia repair.  Married w/ 2 kids (38 yo and 14 yo). Wife and him are in agreement w/ vasectomy.   PMH: Past Medical History:  Diagnosis Date  . Cataract   . Diabetes mellitus 2005  . Hyperlipidemia   . Hypertension   . Other chronic nonalcoholic liver disease     Surgical History: Past Surgical History:  Procedure Laterality Date  . SPINE SURGERY     microdiskectomy    Home Medications:  Allergies as of 12/29/2019      Reactions   Prednisone    Jittery.  Insomnia.   Venlafaxine Anxiety   Increased irritability      Medication List       Accurate as of Dec 29, 2019  3:23 PM. If you have any questions, ask your nurse or doctor.        ALPRAZolam 1 MG tablet Commonly known as: XANAX TAKE 1 TABLET BY MOUTH ONCE DAILY AS NEEDED FOR SLEEP   atorvastatin 20 MG tablet Commonly known as: LIPITOR Take 20 mg by mouth daily. What changed: Another medication with the same name was removed. Continue taking this medication, and follow the directions you see here. Changed by: Hollice Espy, MD   Basaglar KwikPen 100 UNIT/ML Inject 0.2 mLs (20 Units total) into the skin daily.   diazepam 10 MG tablet Commonly known as: Valium Take 1 tablet (10 mg total) by mouth once for 1 dose. Take 1 hour prior to procedure. Please have a driver available. Started by: Hollice Espy, MD   glipiZIDE 5 MG tablet Commonly known as: Glucotrol Take 0.5 tablets (2.5 mg total) by mouth  daily before supper.   glucose blood test strip Check sugar twice daily. One Touch Ultra strips. Dx: E11.65   Jardiance 25 MG Tabs tablet Generic drug: empagliflozin Take 1 tablet by mouth once daily   losartan 25 MG tablet Commonly known as: COZAAR TAKE 1 TABLET BY MOUTH AT BEDTIME   metFORMIN 1000 MG tablet Commonly known as: GLUCOPHAGE Take 1 tablet by mouth twice daily   naproxen sodium 220 MG tablet Commonly known as: ALEVE Take 220 mg by mouth 2 (two) times daily with a meal.       Allergies:  Allergies  Allergen Reactions  . Prednisone     Jittery.  Insomnia.  . Venlafaxine Anxiety    Increased irritability    Family History: Family History  Problem Relation Age of Onset  . Diabetes Mother   . Hypertension Mother   . Hypertension Father   . Heart disease Father     Social History:  reports that he quit smoking about 19 years ago. His smoking use included e-cigarettes. He quit smokeless tobacco use about 6 years ago.  His smokeless tobacco use included snuff. He reports current alcohol use of about 5.0 standard drinks of alcohol per week. He reports that he does not use drugs.  Physical Exam: BP 130/78  Pulse 91   Ht 5' 10"  (1.778 m)   Wt 173 lb (78.5 kg)   BMI 24.82 kg/m   Constitutional:  Alert and oriented, No acute distress. HEENT: [place] AT, moist mucus membranes.  Trachea midline, no masses. Cardiovascular: No clubbing, cyanosis, or edema. Respiratory: Normal respiratory effort, no increased work of breathing. GI: Abdomen is soft, nontender, nondistended, no abdominal masses GU: Normal phallus.  Bilateral descended testicles without masses.  Vasa easily palpable bilaterally. Skin: No rashes, bruises or suspicious lesions. Neurologic: Grossly intact, no focal deficits, moving all 4 extremities. Psychiatric: Normal mood and affect.  Laboratory Data: N/a  Urinalysis n/a  Pertinent Imaging: N/a  Assessment & Plan:    1. Vasectomy  evaluation Today, we discussed what the vas deferens is, where it is located, and its function. We reviewed the procedure for vasectomy, it's risks, benefits, alternatives, and likelihood of achieving his goals. We discussed in detail the procedure, complications, and recovery as well as the need for clearance prior to unprotected intercourse. We discussed that vasectomy does not protect against sexually transmitted diseases. We discussed that this procedure does not result in immediate sterility and that they would need to use other forms of birth control until he has been cleared with negative postvasectomy semen analyses. I explained that the procedure is considered to be permanent and that attempts at reversal have varying degrees of success. These options include vasectomy reversal, sperm retrieval, and in vitro fertilization; these can be very expensive. We discussed the chance of postvasectomy pain syndrome which occurs in less than 5% of patients. I explained to the patient that there is no treatment to resolve this chronic pain, and that if it developed I would not be able to help resolve the issue, but that surgery is generally not needed for correction. I explained there have even been reports of systemic like illness associated with this chronic pain, and that there was no good cure. I explained that vasectomy it is not a 100% reliable form of birth control, and the risk of pregnancy after vasectomy is approximately 1 in 2000 men who had a negative postvasectomy semen analysis or rare non-motile sperm. I explained that repeat vasectomy was necessary in less than 1% of vasectomy procedures when employing the type of technique that I use. I explained that he should refrain from ejaculation for approximately one week following vasectomy. I explained that there are other options for birth control which are permanent and non-permanent; we discussed these. I explained the rates of surgical complications, such  as symptomatic hematoma or infection, are low (1-2%) and vary with the surgeon's experience and criteria used to diagnose the complication.   The patient had the opportunity to ask questions to his stated satisfaction. He voiced understanding of the above factors and stated that he has read all the information provided to him and the packets and informed consent.  He is interested in receiving of Valium 10 mg prior to the procedure for the purpose of anxiolysis.  A prescription was given today.  He will have a driver on the day of the procedure.   [place] [address] [place],  [address] [phone]  I, Lucas Mallow, am acting as a scribe for Dr. Hollice Espy,  I have reviewed the above documentation for accuracy and completeness, and I agree with the above.   Hollice Espy, MD

## 2019-12-29 ENCOUNTER — Ambulatory Visit: Payer: BC Managed Care – PPO | Admitting: Urology

## 2019-12-29 ENCOUNTER — Other Ambulatory Visit: Payer: Self-pay

## 2019-12-29 VITALS — BP 130/78 | HR 91 | Ht 70.0 in | Wt 173.0 lb

## 2019-12-29 DIAGNOSIS — Z3009 Encounter for other general counseling and advice on contraception: Secondary | ICD-10-CM

## 2019-12-29 MED ORDER — DIAZEPAM 10 MG PO TABS: 10.0000 mg | ORAL_TABLET | Freq: Once | ORAL | 0 refills | Status: AC

## 2019-12-29 MED ORDER — DIAZEPAM 10 MG PO TABS
10.0000 mg | ORAL_TABLET | Freq: Once | ORAL | 0 refills | Status: DC
Start: 2019-12-29 — End: 2019-12-29

## 2020-01-02 ENCOUNTER — Other Ambulatory Visit: Payer: Self-pay | Admitting: Internal Medicine

## 2020-01-06 ENCOUNTER — Other Ambulatory Visit: Payer: Self-pay | Admitting: Internal Medicine

## 2020-01-08 ENCOUNTER — Other Ambulatory Visit: Payer: Self-pay | Admitting: Internal Medicine

## 2020-01-11 ENCOUNTER — Other Ambulatory Visit: Payer: Self-pay | Admitting: Internal Medicine

## 2020-01-13 ENCOUNTER — Ambulatory Visit: Payer: BC Managed Care – PPO | Admitting: Pharmacist

## 2020-01-13 DIAGNOSIS — E1121 Type 2 diabetes mellitus with diabetic nephropathy: Secondary | ICD-10-CM

## 2020-01-13 DIAGNOSIS — E1169 Type 2 diabetes mellitus with other specified complication: Secondary | ICD-10-CM

## 2020-01-13 DIAGNOSIS — E785 Hyperlipidemia, unspecified: Secondary | ICD-10-CM

## 2020-01-13 MED ORDER — GLIPIZIDE 5 MG PO TABS: 2.5000 mg | ORAL_TABLET | Freq: Every day | ORAL | 1 refills | Status: DC

## 2020-01-13 NOTE — Chronic Care Management (AMB) (Signed)
Chronic Care Management   Follow Up Note   01/13/2020 Name: Andrew Molina MRN: [id number] DOB: [birth year]  Referred by: [name], MD Reason for referral : Chronic Care Management (Medication Management)   Andrew Molina is a 44 y.o. year old male who is a primary care patient of [name], [name], MD. The CCM team was consulted for assistance with chronic disease management and care coordination needs.    Contacted patient for medication management review.   Review of patient status, including review of consultants reports, relevant laboratory and other test results, and collaboration with appropriate care team members and the patient's provider was performed as part of comprehensive patient evaluation and provision of chronic care management services.    SDOH (Social Determinants of Health) assessments performed: No See Care Plan activities for detailed interventions related to [name])     Outpatient Encounter Medications as of 01/13/2020  Medication Sig Note   ALPRAZolam (XANAX) 1 MG tablet TAKE 1 TABLET BY MOUTH ONCE DAILY AS NEEDED FOR SLEEP    atorvastatin (LIPITOR) 20 MG tablet Take 20 mg by mouth daily.    glipiZIDE (GLUCOTROL) 5 MG tablet Take 0.5-1 tablets (2.5-5 mg total) by mouth daily before supper.    glucose blood test strip Check sugar twice daily. One Touch Ultra strips. Dx: E11.65    Insulin Glargine (BASAGLAR KWIKPEN) 100 UNIT/ML INJECT 20 UNITS SUBCUTANEOUSLY ONCE DAILY    JARDIANCE 25 MG TABS tablet Take 1 tablet by mouth once daily    losartan (COZAAR) 25 MG tablet TAKE 1 TABLET BY MOUTH AT BEDTIME    metFORMIN (GLUCOPHAGE) 1000 MG tablet Take 1 tablet by mouth twice daily    [DISCONTINUED] glipiZIDE (GLUCOTROL) 5 MG tablet Take 0.5 tablets (2.5 mg total) by mouth daily before supper.    naproxen sodium (ANAPROX) 220 MG tablet Take 220 mg by mouth 2 (two) times daily with a meal. 06/14/2019: QAM   No facility-administered encounter medications on file as  of 01/13/2020.     Objective:   Goals Addressed              This Visit's Progress     Patient Stated     "I want to improve my sugars" (pt-stated)        CARE PLAN ENTRY (see longtitudinal plan of care for additional care plan information)  Current Barriers:   Diabetes: uncontrolled; most recent A1c 7.5%    Current antihyperglycemic regimen: metformin 1000 mg BID, Jardiance 25 mg daily, Basaglar 20 units daily, glipizide 2.5 mg w/ supper if eating a high carb meal o Previous hx DPP4 w/ no benefit, Ozempic w/ no benefit   Current meal patterns: o Supper generally biggest meal. Sometimes has bigger lunches, based on what he does at work.   Current blood glucose readings: o Fasting: 100-120s: <130  o 2 hour post-supper: 140, 160, 120, 222, 170, 202, 132  Cardiovascular risk reduction: o Current hypertensive regimen: losartan 25 mg daily o Current hyperlipidemia regimen: atorvastatin 40 mg daily; last LDL at goal   Anxiety: occasional alprazolam 1 mg daily  Pharmacist Clinical Goal(s):   Over the next 90 days, patient will work with PharmD and primary care provider to address optimized glycemic benefit  Interventions:  Comprehensive medication review performed, medication list updated in electronic medical record  Inter-disciplinary care team collaboration (see longitudinal plan of care)  Reviewed goal A1c, goal fasting, goal 2 hour post prandial.   Reviewed importance of monitoring carbohydrate portion sizes.  Reinforced taking glipizide w/ supper larger carbohydrate meals. Discussed keeping track of the types of meals that result in sugars >180, and modify intake in the future. Updated glipizide dose to reflect the dose increase from last PCP visit.   Counseled to avoid taking alprazolam tomorrow morning before procedure, as he was given diazepam.   Reviewed lipids.   Patient Self Care Activities:   Patient will check blood glucose BID , document, and provide  at future appointments  Patient will take medications as prescribed  Patient will report any questions or concerns to provider   Please see past updates related to this goal by clicking on the "Past Updates" button in the selected goal          Plan:  - Scheduled f/u call in ~ 12 weeks  [name], PharmD, [place], [place] Pharmacist [place] [phone]

## 2020-01-13 NOTE — Patient Instructions (Signed)
Visit Information  Goals Addressed              This Visit's Progress     Patient Stated   .  "I want to improve my sugars" (pt-stated)        CARE PLAN ENTRY (see longtitudinal plan of care for additional care plan information)  Current Barriers:  . Diabetes: uncontrolled; most recent A1c 7.5%   . Current antihyperglycemic regimen: metformin 1000 mg BID, Jardiance 25 mg daily, Basaglar 20 units daily, glipizide 2.5 mg w/ supper if eating a high carb meal o Previous hx DPP4 w/ no benefit, Ozempic w/ no benefit  . Current meal patterns: o Supper generally biggest meal. Sometimes has bigger lunches, based on what he does at work.  . Current blood glucose readings: o Fasting: 100-120s: <130  o 2 hour post-supper: 140, 160, 120, 222, 170, 202, 132 . Cardiovascular risk reduction: o Current hypertensive regimen: losartan 25 mg daily o Current hyperlipidemia regimen: atorvastatin 40 mg daily; last LDL at goal  . Anxiety: occasional alprazolam 1 mg daily  Pharmacist Clinical Goal(s):  [name] Over the next 90 days, patient will work with PharmD and primary care provider to address optimized glycemic benefit  Interventions: . Comprehensive medication review performed, medication list updated in electronic medical record . Inter-disciplinary care team collaboration (see longitudinal plan of care) . Reviewed goal A1c, goal fasting, goal 2 hour post prandial.  . Reviewed importance of monitoring carbohydrate portion sizes. Reinforced taking glipizide w/ supper larger carbohydrate meals. Discussed keeping track of the types of meals that result in sugars >180, and modify intake in the future. Updated glipizide dose to reflect the dose increase from last PCP visit.  . Counseled to avoid taking alprazolam tomorrow morning before procedure, as he was given diazepam.  . Reviewed lipids.   Patient Self Care Activities:  . Patient will check blood glucose BID , document, and provide at future  appointments . Patient will take medications as prescribed . Patient will report any questions or concerns to provider   Please see past updates related to this goal by clicking on the "Past Updates" button in the selected goal         Patient verbalizes understanding of instructions provided today.   Plan:  - Scheduled f/u call in ~ 12 weeks  [name], PharmD, [place], [place] Pharmacist [place] [phone]

## 2020-01-14 ENCOUNTER — Encounter: Payer: Self-pay | Admitting: Urology

## 2020-01-14 ENCOUNTER — Other Ambulatory Visit: Payer: Self-pay

## 2020-01-14 ENCOUNTER — Ambulatory Visit (INDEPENDENT_AMBULATORY_CARE_PROVIDER_SITE_OTHER): Payer: BC Managed Care – PPO | Admitting: Urology

## 2020-01-14 VITALS — BP 131/72 | HR 99 | Ht 70.0 in | Wt 168.0 lb

## 2020-01-14 DIAGNOSIS — Z302 Encounter for sterilization: Secondary | ICD-10-CM | POA: Diagnosis not present

## 2020-01-14 NOTE — Patient Instructions (Addendum)
1.Shower tomorrow 2. Alternate Motrin and Tylenol 3. Frozen peas not directly on the skin 4. Compression underwear   Vasectomy, Care After This sheet gives you information about how to care for yourself after your procedure. Your health care provider may also give you more specific instructions. If you have problems or questions, contact your health care provider. What can I expect after the procedure? After your procedure, it is common to have:  Mild pain, swelling, redness, or discomfort in your scrotum.  Some blood coming from your incisions or puncture sites for one or two days.  Blood in your semen. Follow these instructions at home: Medicines   Take over-the-counter and prescription medicines only as told by your health care provider.  Avoid taking NSAIDs such as aspirin and ibuprofen, because these medicines can make bleeding worse. Activity  For the first 2 days after surgery, avoid physical activity and exercise that require a lot of energy. Ask your health care provider what activities are safe for you.  Do not participate in sports or perform heavy physical labor until your pain has improved, or until your health care provider says it is okay.  Do not ejaculate for at least 1 week after the procedure, or as long as directed.  You may resume sexual activity 7-10 days after your procedure, or when your health care provider approves. Use a different method of birth control (contraception) until you have had test results that confirm that there is no sperm in your semen. Scrotal support  Use scrotal support, such as a jock strap or underwear with a supportive pouch, as needed for one week after your procedure.  If you feel discomfort in your scrotum, you may remove the scrotal support to see if the discomfort is relieved. Sometimes scrotal support can press on the scrotum and cause or worsen discomfort.  If your skin gets irritated, you may add some germ-free (sterile),  fluffed bandages or a clean washcloth to the scrotal support. General instructions  Put ice on the injured area: ? Put ice in a plastic bag. ? Place a towel between your skin and the bag. ? Leave the ice on for 20 minutes, 2-3 times a day.  Check your incisions or puncture sites every day for signs of infection. Check for: ? Redness, swelling, or pain. ? Fluid or blood. ? Warmth. ? Pus or a bad smell.  Leave stitches (sutures) in place. The sutures will dissolve on their own and do not need to be removed.  Keep all follow-up visits as told by your health care provider. This is important because you will need a test to confirm that there is no sperm in your semen. Multiple ejaculations are needed to clear out sperm that were beyond the vasectomy site. You will need one test result showing that there is no sperm in your semen before you can resume unprotected sex. This may take 2-4 months after your procedure.  Do not drive for 24 hours if you were given a sedative to help you relax. Contact a health care provider if:  You have redness, swelling, or more pain around your incision or puncture site, or in your scrotum area in general.  You have bleeding from your incision or puncture site.  You have pus or a bad smell coming from your incision or puncture site.  You have a fever.  Your incision or puncture site opens up. Get help right away if:  You develop a rash.  You have difficulty breathing.  Summary  After your procedure it is common to have mild pain, swelling, redness, or discomfort in your scrotum.  Avoid physical activity and exercise that requires a lot of energy for the first 2 days after surgery.  Put ice on the injured area. Leave the ice on for 20 minutes, 2-3 times a day.  Do not drive for 24 hours if you were given a sedative to help you relax. This information is not intended to replace advice given to you by your health care provider. Make sure you discuss  any questions you have with your health care provider. Document Revised: 07/04/2017 Document Reviewed: 10/18/2016 Elsevier Patient Education  [place].

## 2020-01-14 NOTE — Progress Notes (Signed)
01/14/20  CC:  Chief Complaint  Patient presents with  . VAS    HPI:  Blood pressure 131/72, pulse 99, height 5' 10"  (1.778 m), weight 168 lb (76.2 kg). NED. A&Ox3.   No respiratory distress   Abd soft, NT, ND Normal external genitalia with patent urethral meatus   Bilateral Vasectomy Procedure  Pre-Procedure: - Patient's scrotum was prepped and draped for vasectomy. - The vas was palpated through the scrotal skin on the left. - 1% Xylocaine was injected into the skin and surrounding tissue for placement  - In a similar manner, the vas on the right was identified, anesthetized, and stabilized.  Procedure: - A #11 blade was used to create a small stab incision in the scrotum overlying the vas - The left vas was isolated and brought up through the incision exposing that structure. - Bleeding points were cauterized as they occurred. - The vas was free from the surrounding structures and brought to the view. - A segment was positioned for placement with a hemostat. - A second hemostat was placed and a small segment between the two hemostats and was removed for inspection. - Each end of the transected vas lumen was fulgurated/ obliterated using needlepoint electrocautery -A fascial interposition was performed on testicular end of the vas using #3-0 chromic suture -The same procedure was performed on the right. - A single suture of #3-0 chromic catgut was used to close each lateral scrotal skin incision - A dressing was applied.  Post-Procedure: - Patient was instructed in care of the operative area - A specimen is to be delivered in 12 weeks   -Another form of contraception is to be used until post vasectomy semen analysis  Hollice Espy, MD

## 2020-01-31 ENCOUNTER — Telehealth: Payer: Self-pay

## 2020-01-31 NOTE — Telephone Encounter (Signed)
Let's bring him in Friday since abx are helping, ask him to call us back if he acutely worsens before then.

## 2020-01-31 NOTE — Telephone Encounter (Signed)
Called patient to follow up on after hours nurse line, which states that he believes he has an infection at his VAS incision site. Patient states that he went to Urgent care and was started on abx for infection and was told to follow up with our office. Patient states he is doing much better today on abx. When should patient be scheduled for a follow up wound check?

## 2020-02-01 NOTE — Telephone Encounter (Signed)
Patient notified, he states his site is still getting better. Apt was made for Friday for recheck

## 2020-02-04 ENCOUNTER — Encounter: Payer: Self-pay | Admitting: Physician Assistant

## 2020-02-04 ENCOUNTER — Ambulatory Visit (INDEPENDENT_AMBULATORY_CARE_PROVIDER_SITE_OTHER): Payer: BC Managed Care – PPO | Admitting: Physician Assistant

## 2020-02-04 ENCOUNTER — Other Ambulatory Visit: Payer: Self-pay

## 2020-02-04 VITALS — BP 130/68 | HR 79 | Ht 70.0 in | Wt 170.0 lb

## 2020-02-04 DIAGNOSIS — Z4889 Encounter for other specified surgical aftercare: Secondary | ICD-10-CM

## 2020-02-04 NOTE — Progress Notes (Signed)
02/04/2020 3:49 PM   [name] 24-May-1976 [id number]  CC: Chief Complaint  Patient presents with  . Follow-up    site infection check    HPI: Andrew Molina is a 44 y.o. male s/p vasectomy with Dr. Erlene Quan on 01/14/2020 who was treated for postprocedural infection with doxycycline 100 mg twice daily x10 days who presents today for wound recheck.  Today, patient significant improvement in scrotal and LLQ discomfort on antibiotics.  He has approximately 3 days left of antibiotics.  He is concerned about possible scarring at the site of his vasectomy incision on the left side.  He switched from compressive underwear to boxers and noted improvement in his scrotal pain with this.  He denies dysuria.  PMH: Past Medical History:  Diagnosis Date  . Cataract   . Diabetes mellitus 2005  . Hyperlipidemia   . Hypertension   . Other chronic nonalcoholic liver disease     Surgical History: Past Surgical History:  Procedure Laterality Date  . SPINE SURGERY     microdiskectomy    Home Medications:  Allergies as of 02/04/2020      Reactions   Prednisone    Jittery.  Insomnia.   Venlafaxine Anxiety   Increased irritability      Medication List       Accurate as of February 04, 2020  3:49 PM. If you have any questions, ask your nurse or doctor.        ALPRAZolam 1 MG tablet Commonly known as: XANAX TAKE 1 TABLET BY MOUTH ONCE DAILY AS NEEDED FOR SLEEP   atorvastatin 20 MG tablet Commonly known as: LIPITOR Take 20 mg by mouth daily.   Basaglar KwikPen 100 UNIT/ML INJECT 20 UNITS SUBCUTANEOUSLY ONCE DAILY   doxycycline 100 MG capsule Commonly known as: VIBRAMYCIN Take 100 mg by mouth 2 (two) times daily.   glipiZIDE 5 MG tablet Commonly known as: Glucotrol Take 0.5-1 tablets (2.5-5 mg total) by mouth daily before supper.   glucose blood test strip Check sugar twice daily. One Touch Ultra strips. Dx: E11.65   Jardiance 25 MG Tabs tablet Generic drug:  empagliflozin Take 1 tablet by mouth once daily   losartan 25 MG tablet Commonly known as: COZAAR TAKE 1 TABLET BY MOUTH AT BEDTIME   metFORMIN 1000 MG tablet Commonly known as: GLUCOPHAGE Take 1 tablet by mouth twice daily   naproxen sodium 220 MG tablet Commonly known as: ALEVE Take 220 mg by mouth 2 (two) times daily with a meal.       Allergies:  Allergies  Allergen Reactions  . Prednisone     Jittery.  Insomnia.  . Venlafaxine Anxiety    Increased irritability    Family History: Family History  Problem Relation Age of Onset  . Diabetes Mother   . Hypertension Mother   . Hypertension Father   . Heart disease Father     Social History:   reports that he quit smoking about 19 years ago. His smoking use included e-cigarettes. He quit smokeless tobacco use about 7 years ago.  His smokeless tobacco use included snuff. He reports current alcohol use of about 5.0 standard drinks of alcohol per week. He reports that he does not use drugs.  Physical Exam: BP 130/68   Pulse 79   Ht 5' 10"  (1.778 m)   Wt 170 lb (77.1 kg)   BMI 24.39 kg/m   Constitutional:  Alert and oriented, no acute distress, nontoxic appearing HEENT: [place], AT Cardiovascular: No  clubbing, cyanosis, or edema Respiratory: Normal respiratory effort, no increased work of breathing GU: Right vasectomy incision visualized, well-healed and barely visible.  Left vasectomy incision with mild erythema and scabbing without purulence or tenderness.  Slight edema of the left spermatic cord without tenderness. Skin: No rashes, bruises or suspicious lesions Neurologic: Grossly intact, no focal deficits, moving all 4 extremities Psychiatric: Normal mood and affect  Assessment & Plan:   1. Encounter for post surgical wound check Well-healing today, no evidence of persistent infection versus abscess.  Counseled patient to complete antibiotics as prescribed and utilize scrotal support as needed: Compressive underwear,  cryotherapy, and scrotal elevation.  He expressed understanding.  Return if symptoms worsen or fail to improve.  Debroah Loop, PA-C  [facility] [address] [place], [place] [address] [phone]

## 2020-02-08 ENCOUNTER — Other Ambulatory Visit: Payer: Self-pay | Admitting: Internal Medicine

## 2020-02-09 NOTE — Telephone Encounter (Signed)
Refill request for xanax, last seen 12-07-19, last filled 01-08-20.  Please advise.

## 2020-02-21 ENCOUNTER — Other Ambulatory Visit: Payer: Self-pay | Admitting: Internal Medicine

## 2020-03-09 ENCOUNTER — Other Ambulatory Visit: Payer: Self-pay

## 2020-03-09 ENCOUNTER — Other Ambulatory Visit (INDEPENDENT_AMBULATORY_CARE_PROVIDER_SITE_OTHER): Payer: BC Managed Care – PPO

## 2020-03-09 DIAGNOSIS — E1121 Type 2 diabetes mellitus with diabetic nephropathy: Secondary | ICD-10-CM | POA: Diagnosis not present

## 2020-03-09 LAB — COMPREHENSIVE METABOLIC PANEL
ALT: 32 U/L (ref 0–53)
AST: 23 U/L (ref 0–37)
Albumin: 4.7 g/dL (ref 3.5–5.2)
Alkaline Phosphatase: 53 U/L (ref 39–117)
BUN: 17 mg/dL (ref 6–23)
CO2: 26 mEq/L (ref 19–32)
Calcium: 9.7 mg/dL (ref 8.4–10.5)
Chloride: 104 mEq/L (ref 96–112)
Creatinine, Ser: 0.65 mg/dL (ref 0.40–1.50)
GFR: 133.23 mL/min (ref >60.00)
Glucose, Bld: 180 mg/dL — ABNORMAL HIGH (ref 70–99)
Potassium: 4.1 mEq/L (ref 3.5–5.1)
Sodium: 137 mEq/L (ref 135–145)
Total Bilirubin: 0.5 mg/dL (ref 0.2–1.2)
Total Protein: 7.1 g/dL (ref 6.0–8.3)

## 2020-03-09 LAB — HEMOGLOBIN A1C: Hgb A1c MFr Bld: 7.2 % — ABNORMAL HIGH (ref 4.6–6.5)

## 2020-03-13 ENCOUNTER — Ambulatory Visit: Payer: BC Managed Care – PPO | Admitting: Internal Medicine

## 2020-03-14 ENCOUNTER — Ambulatory Visit: Payer: BC Managed Care – PPO | Admitting: Internal Medicine

## 2020-03-14 ENCOUNTER — Encounter: Payer: Self-pay | Admitting: Internal Medicine

## 2020-03-14 ENCOUNTER — Other Ambulatory Visit: Payer: Self-pay

## 2020-03-14 VITALS — BP 108/64 | HR 84 | Temp 98.5°F | Resp 14 | Ht 70.0 in | Wt 171.0 lb

## 2020-03-14 DIAGNOSIS — Z1159 Encounter for screening for other viral diseases: Secondary | ICD-10-CM | POA: Diagnosis not present

## 2020-03-14 DIAGNOSIS — K760 Fatty (change of) liver, not elsewhere classified: Secondary | ICD-10-CM

## 2020-03-14 DIAGNOSIS — E781 Pure hyperglyceridemia: Secondary | ICD-10-CM

## 2020-03-14 DIAGNOSIS — E1121 Type 2 diabetes mellitus with diabetic nephropathy: Secondary | ICD-10-CM

## 2020-03-14 NOTE — Assessment & Plan Note (Signed)
LDL is now at  goal on current medications  liver enzymes are normal. No changes today.   Lab Results  Component Value Date   CHOL 144 11/22/2019   HDL 40.70 11/22/2019   LDLCALC 85 11/22/2019   LDLDIRECT 100.0 02/11/2019   TRIG 95.0 11/22/2019   CHOLHDL 4 11/22/2019   Lab Results  Component Value Date   ALT 32 03/09/2020   AST 23 03/09/2020   ALKPHOS 53 03/09/2020   BILITOT 0.5 03/09/2020

## 2020-03-14 NOTE — Assessment & Plan Note (Signed)
Improving control on multi drug regimen. Continue glipizide 2.5 qhs,  Basaglar, metformin and jardiance.  Encouraged to exercise .

## 2020-03-14 NOTE — Patient Instructions (Signed)
Your diabetes is under excellent control currently. You have lowered your a1c  from 7.5 to 2.0.   Please plan to return in 6  months for follow up on diabetes.   Fasting labs need to be done a day or two prior to visit so we can discuss them at your visit.   If you start noticing a loss of control ,, you can have your A1 c repeated any time after Nov 6    You can increase the insulin by 5 units if needed

## 2020-03-14 NOTE — Progress Notes (Signed)
Subjective:  Patient ID: Andrew Molina, male    DOB: 03/18/1976  Age: 44 y.o. MRN: [id number]  CC: The primary encounter diagnosis was Encounter for hepatitis C screening test for low risk patient. Diagnoses of NAFLD (nonalcoholic fatty liver disease), Hypertriglyceridemia, and Diabetes mellitus with microalbuminuric diabetic nephropathy ([place]) were also pertinent to this visit.  HPI Andrew Molina presents for 3 month follow up   This visit occurred during the SARS-CoV-2 public health emergency.  Safety protocols were in place, including screening questions prior to the visit, additional usage of staff PPE, and extensive cleaning of exam room while observing appropriate contact time as indicated for disinfecting solutions.     Patient has received NO  doses of the available COVID 19 vaccines due to concern about the effect of non FDA approved mRNA vaccines on future pregnancies .   Patient continues to mask when outside of the home except when walking in yard or at safe distances from others .  Patient denies any change in mood or development of unhealthy behaviors resuting from the pandemic's restriction of activities and socialization.    3 month follow up on diabetes.  Patient has no complaints today.  Patient is following a low glycemic index diet and taking all prescribed medications regularly without side effects.  Fasting sugars have been under less than 140 most of the time and post prandials have been under 160 except on rare occasions. Patient is exercising about 3 times per week and intentionally trying to lose weight .  Patient has had an eye exam in the last 12 months and checks feet regularly for signs of infection.  Patient does not walk barefoot outside,  And denies an numbness tingling or burning in feet. Patient is up to date on all recommended vaccinations.   Outpatient Medications Prior to Visit  Medication Sig Dispense Refill  . ALPRAZolam (XANAX) 1 MG tablet TAKE 1 TABLET BY  MOUTH ONCE DAILY AS NEEDED FOR SLEEP 30 tablet 5  . atorvastatin (LIPITOR) 20 MG tablet Take 20 mg by mouth daily.    [name] glipiZIDE (GLUCOTROL) 5 MG tablet Take 0.5-1 tablets (2.5-5 mg total) by mouth daily before supper. 90 tablet 1  . glucose blood test strip Check sugar twice daily. One Touch Ultra strips. Dx: E11.65 200 each 5  . Insulin Glargine (BASAGLAR KWIKPEN) 100 UNIT/ML INJECT 20 UNITS SUBCUTANEOUSLY ONCE DAILY 15 mL 0  . JARDIANCE 25 MG TABS tablet Take 1 tablet by mouth once daily 90 tablet 0  . losartan (COZAAR) 25 MG tablet TAKE 1 TABLET BY MOUTH AT BEDTIME 90 tablet 0  . metFORMIN (GLUCOPHAGE) 1000 MG tablet Take 1 tablet by mouth twice daily 180 tablet 0  . naproxen sodium (ANAPROX) 220 MG tablet Take 220 mg by mouth 2 (two) times daily with a meal.    . doxycycline (VIBRAMYCIN) 100 MG capsule Take 100 mg by mouth 2 (two) times daily. (Patient not taking: Reported on 03/14/2020)     No facility-administered medications prior to visit.    Review of Systems;  Patient denies headache, fevers, malaise, unintentional weight loss, skin rash, eye pain, sinus congestion and sinus pain, sore throat, dysphagia,  hemoptysis , cough, dyspnea, wheezing, chest pain, palpitations, orthopnea, edema, abdominal pain, nausea, melena, diarrhea, constipation, flank pain, dysuria, hematuria, urinary  Frequency, nocturia, numbness, tingling, seizures,  Focal weakness, Loss of consciousness,  Tremor, insomnia, depression, anxiety, and suicidal ideation.      Objective:  BP 108/64 (BP Location:  Left Arm, Patient Position: Sitting, Cuff Size: Normal)   Pulse 84   Temp 98.5 F (36.9 C) (Oral)   Resp 14   Ht 5' 10"  (1.778 m)   Wt 171 lb (77.6 kg)   SpO2 98%   BMI 24.54 kg/m   BP Readings from Last 3 Encounters:  03/14/20 108/64  02/04/20 130/68  01/14/20 131/72    Wt Readings from Last 3 Encounters:  03/14/20 171 lb (77.6 kg)  02/04/20 170 lb (77.1 kg)  01/14/20 168 lb (76.2 kg)     General appearance: alert, cooperative and appears stated age Ears: normal TM's and external ear canals both ears Throat: lips, mucosa, and tongue normal; teeth and gums normal Neck: no adenopathy, no carotid bruit, supple, symmetrical, trachea midline and thyroid not enlarged, symmetric, no tenderness/mass/nodules Back: symmetric, no curvature. ROM normal. No CVA tenderness. Lungs: clear to auscultation bilaterally Heart: regular rate and rhythm, S1, S2 normal, no murmur, click, rub or gallop Abdomen: soft, non-tender; bowel sounds normal; no masses,  no organomegaly Pulses: 2+ and symmetric Skin: Skin color, texture, turgor normal. No rashes or lesions Lymph nodes: Cervical, supraclavicular, and axillary nodes normal.  Lab Results  Component Value Date   HGBA1C 7.2 (H) 03/09/2020   HGBA1C 7.5 (H) 11/29/2019   HGBA1C 7.9 (H) 08/20/2019    Lab Results  Component Value Date   CREATININE 0.65 03/09/2020   CREATININE 0.65 11/22/2019   CREATININE 0.69 08/20/2019    Lab Results  Component Value Date   WBC 6.6 02/27/2016   HGB 13.4 02/27/2016   HCT 39.1 02/27/2016   PLT 250.0 02/27/2016   GLUCOSE 180 (H) 03/09/2020   CHOL 144 11/22/2019   TRIG 95.0 11/22/2019   HDL 40.70 11/22/2019   LDLDIRECT 100.0 02/11/2019   LDLCALC 85 11/22/2019   ALT 32 03/09/2020   AST 23 03/09/2020   NA 137 03/09/2020   K 4.1 03/09/2020   CL 104 03/09/2020   CREATININE 0.65 03/09/2020   BUN 17 03/09/2020   CO2 26 03/09/2020   TSH 1.92 08/14/2016   HGBA1C 7.2 (H) 03/09/2020   MICROALBUR 16.6 (H) 08/20/2019    DG Facial Bones 1-2 Views  Result Date: 02/18/2019 CLINICAL DATA:  Foreign body post injury 1.5 months ago. EXAM: FACIAL BONES - 1-2 VIEW COMPARISON:  None. FINDINGS: There is no evidence of fracture or other significant bone abnormality. No orbital emphysema or sinus air-fluid levels are seen. There is a 6 mm metallic fragment within the soft tissues overlying the left zygomatic arch.  IMPRESSION: 6 mm metallic foreign body within the soft tissues overlying the left zygomatic arch. Electronically Signed   By: Fidela Salisbury M.D.   On: 02/18/2019 14:33    Assessment & Plan:   Problem List Items Addressed This Visit      Unprioritized   NAFLD (nonalcoholic fatty liver disease)   Relevant Orders   Comprehensive metabolic panel   Diabetes mellitus with microalbuminuric diabetic nephropathy ([place])    Improving control on multi drug regimen. Continue glipizide 2.5 qhs,  Basaglar, metformin and jardiance.  Encouraged to exercise .       Relevant Orders   Hemoglobin A1c   Microalbumin / creatinine urine ratio   Hypertriglyceridemia    LDL is now at  goal on current medications  liver enzymes are normal. No changes today.   Lab Results  Component Value Date   CHOL 144 11/22/2019   HDL 40.70 11/22/2019   LDLCALC 85 11/22/2019   LDLDIRECT  100.0 02/11/2019   TRIG 95.0 11/22/2019   CHOLHDL 4 11/22/2019   Lab Results  Component Value Date   ALT 32 03/09/2020   AST 23 03/09/2020   ALKPHOS 53 03/09/2020   BILITOT 0.5 03/09/2020         Relevant Orders   Lipid panel    Other Visit Diagnoses    Encounter for hepatitis C screening test for low risk patient    -  Primary   Relevant Orders   Hepatitis C antibody      I have discontinued Glendell Docker B. Mathia's doxycycline. I am also having him maintain his naproxen sodium, glucose blood, atorvastatin, Jardiance, Basaglar KwikPen, losartan, glipiZIDE, ALPRAZolam, and metFORMIN.  No orders of the defined types were placed in this encounter.   Medications Discontinued During This Encounter  Medication Reason  . doxycycline (VIBRAMYCIN) 100 MG capsule Completed Course    Follow-up: Return in about 6 months (around 09/14/2020).   [name], MD

## 2020-03-22 ENCOUNTER — Other Ambulatory Visit: Payer: Self-pay | Admitting: Internal Medicine

## 2020-03-22 DIAGNOSIS — E785 Hyperlipidemia, unspecified: Secondary | ICD-10-CM

## 2020-03-24 ENCOUNTER — Other Ambulatory Visit: Payer: Self-pay | Admitting: Internal Medicine

## 2020-03-27 ENCOUNTER — Other Ambulatory Visit: Payer: Self-pay | Admitting: Internal Medicine

## 2020-03-27 DIAGNOSIS — E119 Type 2 diabetes mellitus without complications: Secondary | ICD-10-CM | POA: Diagnosis not present

## 2020-03-27 LAB — HM DIABETES EYE EXAM

## 2020-03-31 ENCOUNTER — Other Ambulatory Visit: Payer: Self-pay | Admitting: Internal Medicine

## 2020-04-10 ENCOUNTER — Encounter: Payer: Self-pay | Admitting: Urology

## 2020-04-13 ENCOUNTER — Ambulatory Visit: Payer: BC Managed Care – PPO | Admitting: Pharmacist

## 2020-04-13 DIAGNOSIS — E1121 Type 2 diabetes mellitus with diabetic nephropathy: Secondary | ICD-10-CM

## 2020-04-13 DIAGNOSIS — E1165 Type 2 diabetes mellitus with hyperglycemia: Secondary | ICD-10-CM

## 2020-04-13 MED ORDER — FREESTYLE LIBRE 14 DAY SENSOR MISC: 2 refills | Status: DC

## 2020-04-13 NOTE — Patient Instructions (Signed)
Visit Information  Goals Addressed              This Visit's Progress     Patient Stated   .  "I want to improve my sugars" (pt-stated)        CARE PLAN ENTRY (see longtitudinal plan of care for additional care plan information)  Current Barriers:  . Social, financial, community barriers:  o Denies any cost concerns at this time o Does note that he has not been as motivated to improve glucose control recently . Diabetes: uncontrolled; most recent A1c 7.2%   . Current antihyperglycemic regimen: metformin 1000 mg BID, Jardiance 25 mg daily, Basaglar 20 units daily, glipizide 2.5 mg w/ supper if eating a high carb meal o Previous hx DPP4 w/ no benefit, Ozempic w/ no benefit  . Current meal patterns: o Breakfast: Atkins bar, if no bars in stock, piece of toast o Lunch: Sandwich, sometimes will get take out from fast food -meat and vegetables, avoids breads, sweet drinks o Supper: Meat w/ vegetables as above o Drinks: avoids sweet drinks, choosing unsweet tea . Current blood glucose readings: checking recently, but does not have readings with him and does not remember readings o Fastings 110-130s o Not checking other times of the day . Cardiovascular risk reduction: o Current hypertensive regimen: losartan 25 mg daily o Current hyperlipidemia regimen: atorvastatin 40 mg daily; LDL at goal <100 . Anxiety: occasional alprazolam 1 mg daily, infrequent use  Pharmacist Clinical Goal(s):  [name] Over the next 90 days, patient will work with PharmD and primary care provider to address optimized glycemic benefit  Interventions: . Comprehensive medication review performed, medication list updated in electronic medical record . Inter-disciplinary care team collaboration (see longitudinal plan of care) . As reported fastings at goal, will avoid increasing Basaglar . Discussed benefit of CGM to reduce fingerstick burden, as well as providing more information to allow for dietary modification.  Patient interested. Completed PA for Libre 14 day on Cover My Meds, sent sensor script. Will await insurance determination. Reviewed that if insurance does not cover, cash price is $75/month. We could also utilize a clinic sample for patient to determine if he would benefit from the technology prior to having to pay cash price. Will follow insurance determination for next steps.  Patient Self Care Activities:  . Patient will check blood glucose BID , document, and provide at future appointments . Patient will take medications as prescribed . Patient will report any questions or concerns to provider   Please see past updates related to this goal by clicking on the "Past Updates" button in the selected goal         The patient verbalized understanding of instructions provided today and declined a print copy of patient instruction materials.   Plan:  - Scheduled f/u call in ~ 4 weeks  [name], PharmD, [place], [place] Pharmacist [place] [phone]

## 2020-04-13 NOTE — Chronic Care Management (AMB) (Signed)
Chronic Care Management   Follow Up Note   04/13/2020 Name: Andrew Molina MRN: [id number] DOB: 20-Dec-1975  Referred by: [name], MD Reason for referral : Chronic Care Management (Medication Management)   Andrew Molina is a 44 y.o. year old male who is a primary care patient of [name], [name], MD. The CCM team was consulted for assistance with chronic disease management and care coordination needs.    Contacted patient for medication management review.   Review of patient status, including review of consultants reports, relevant laboratory and other test results, and collaboration with appropriate care team members and the patient's provider was performed as part of comprehensive patient evaluation and provision of chronic care management services.    SDOH (Social Determinants of Health) assessments performed: Yes See Care Plan activities for detailed interventions related to SDOH)  SDOH Interventions     Most Recent Value  SDOH Interventions  Financial Strain Interventions Intervention Not Indicated       Outpatient Encounter Medications as of 04/13/2020  Medication Sig Note  . ALPRAZolam (XANAX) 1 MG tablet TAKE 1 TABLET BY MOUTH ONCE DAILY AS NEEDED FOR SLEEP   . atorvastatin (LIPITOR) 20 MG tablet Take 1 tablet by mouth once daily   . glipiZIDE (GLUCOTROL) 5 MG tablet Take 0.5-1 tablets (2.5-5 mg total) by mouth daily before supper. 04/13/2020: 2.5 mg PRN for larger meals  . glucose blood test strip Check sugar twice daily. One Touch Ultra strips. Dx: E11.65   . Insulin Glargine (BASAGLAR KWIKPEN) 100 UNIT/ML INJECT 20 UNITS SUBCUTANEOUSLY ONCE DAILY   . JARDIANCE 25 MG TABS tablet Take 1 tablet by mouth once daily   . losartan (COZAAR) 25 MG tablet TAKE 1 TABLET BY MOUTH AT BEDTIME   . metFORMIN (GLUCOPHAGE) 1000 MG tablet Take 1 tablet by mouth twice daily   . naproxen sodium (ANAPROX) 220 MG tablet Take 220 mg by mouth 2 (two) times daily with a meal.    No  facility-administered encounter medications on file as of 04/13/2020.     Objective:   Goals Addressed              This Visit's Progress     Patient Stated   .  "I want to improve my sugars" (pt-stated)        CARE PLAN ENTRY (see longtitudinal plan of care for additional care plan information)  Current Barriers:  . Social, financial, community barriers:  o Denies any cost concerns at this time o Does note that he has not been as motivated to improve glucose control recently . Diabetes: uncontrolled; most recent A1c 7.2%   . Current antihyperglycemic regimen: metformin 1000 mg BID, Jardiance 25 mg daily, Basaglar 20 units daily, glipizide 2.5 mg w/ supper if eating a high carb meal o Previous hx DPP4 w/ no benefit, Ozempic w/ no benefit  . Current meal patterns: o Breakfast: Atkins bar, if no bars in stock, piece of toast o Lunch: Sandwich, sometimes will get take out from fast food -meat and vegetables, avoids breads, sweet drinks o Supper: Meat w/ vegetables as above o Drinks: avoids sweet drinks, choosing unsweet tea . Current blood glucose readings: checking recently, but does not have readings with him and does not remember readings o Fastings 110-130s o Not checking other times of the day . Cardiovascular risk reduction: o Current hypertensive regimen: losartan 25 mg daily o Current hyperlipidemia regimen: atorvastatin 40 mg daily; LDL at goal <100 . Anxiety: occasional  alprazolam 1 mg daily, infrequent use  Pharmacist Clinical Goal(s):  [name] Over the next 90 days, patient will work with PharmD and primary care provider to address optimized glycemic benefit  Interventions: . Comprehensive medication review performed, medication list updated in electronic medical record . Inter-disciplinary care team collaboration (see longitudinal plan of care) . As reported fastings at goal, will avoid increasing Basaglar . Discussed benefit of CGM to reduce fingerstick burden, as well  as providing more information to allow for dietary modification. Patient interested. Completed PA for Libre 14 day on Cover My Meds, sent sensor script. Will await insurance determination. Reviewed that if insurance does not cover, cash price is $75/month. We could also utilize a clinic sample for patient to determine if he would benefit from the technology prior to having to pay cash price. Will follow insurance determination for next steps.  Patient Self Care Activities:  . Patient will check blood glucose BID , document, and provide at future appointments . Patient will take medications as prescribed . Patient will report any questions or concerns to provider   Please see past updates related to this goal by clicking on the "Past Updates" button in the selected goal          Plan:  - Scheduled f/u call in ~ 4 weeks  [name], PharmD, [place], [place] Pharmacist [place] [phone]

## 2020-04-17 ENCOUNTER — Other Ambulatory Visit: Payer: Self-pay

## 2020-04-17 ENCOUNTER — Other Ambulatory Visit: Payer: BC Managed Care – PPO

## 2020-04-17 DIAGNOSIS — Z302 Encounter for sterilization: Secondary | ICD-10-CM

## 2020-04-18 LAB — POST-VAS SPERM EVALUATION,QUAL: Volume: 1 mL

## 2020-04-19 ENCOUNTER — Ambulatory Visit: Payer: BC Managed Care – PPO | Admitting: Pharmacist

## 2020-04-19 ENCOUNTER — Telehealth: Payer: Self-pay

## 2020-04-19 DIAGNOSIS — E1165 Type 2 diabetes mellitus with hyperglycemia: Secondary | ICD-10-CM

## 2020-04-19 MED ORDER — DEXCOM G6 TRANSMITTER MISC: 0 refills | Status: DC

## 2020-04-19 MED ORDER — DEXCOM G6 SENSOR MISC: 2 refills | Status: DC

## 2020-04-19 NOTE — Telephone Encounter (Signed)
-----   Message from Hollice Espy, MD sent at 04/18/2020  4:56 PM EDT ----- No sperm.  Good to go.    Hollice Espy, MD

## 2020-04-19 NOTE — Telephone Encounter (Signed)
Patient aware of results.

## 2020-04-19 NOTE — Chronic Care Management (AMB) (Signed)
Chronic Care Management   Follow Up Note   04/19/2020 Name: Andrew Molina MRN: [id number] DOB: [birth year]  Referred by: [name], MD Reason for referral : Chronic Care Management (Medication Management)   Andrew Molina is a 44 y.o. year old male who is a primary care patient of [name], [name], MD. The CCM team was consulted for assistance with chronic disease management and care coordination needs.    Review of patient status, including review of consultants reports, relevant laboratory and other test results, and collaboration with appropriate care team members and the patient's provider was performed as part of comprehensive patient evaluation and provision of chronic care management services.    SDOH (Social Determinants of Health) assessments performed: No See Care Plan activities for detailed interventions related to [facility])     Outpatient Encounter Medications as of 04/19/2020  Medication Sig Note  . ALPRAZolam (XANAX) 1 MG tablet TAKE 1 TABLET BY MOUTH ONCE DAILY AS NEEDED FOR SLEEP   . atorvastatin (LIPITOR) 20 MG tablet Take 1 tablet by mouth once daily   . Continuous Blood Gluc Sensor (DEXCOM G6 SENSOR) MISC Use to check glucose QID   . Continuous Blood Gluc Sensor (FREESTYLE LIBRE 14 DAY SENSOR) MISC Use to check glucose at least 4 times daily   . Continuous Blood Gluc Transmit (DEXCOM G6 TRANSMITTER) MISC Use to check glucose at least QID   . glipiZIDE (GLUCOTROL) 5 MG tablet Take 0.5-1 tablets (2.5-5 mg total) by mouth daily before supper. 04/13/2020: 2.5 mg PRN for larger meals  . glucose blood test strip Check sugar twice daily. One Touch Ultra strips. Dx: E11.65   . Insulin Glargine (BASAGLAR KWIKPEN) 100 UNIT/ML INJECT 20 UNITS SUBCUTANEOUSLY ONCE DAILY   . JARDIANCE 25 MG TABS tablet Take 1 tablet by mouth once daily   . losartan (COZAAR) 25 MG tablet TAKE 1 TABLET BY MOUTH AT BEDTIME   . metFORMIN (GLUCOPHAGE) 1000 MG tablet Take 1 tablet by mouth twice daily   .  naproxen sodium (ANAPROX) 220 MG tablet Take 220 mg by mouth 2 (two) times daily with a meal.    No facility-administered encounter medications on file as of 04/19/2020.     Objective:   Goals Addressed              This Visit's Progress     Patient Stated   .  "I want to improve my sugars" (pt-stated)        CARE PLAN ENTRY (see longtitudinal plan of care for additional care plan information)  Current Barriers:  . Social, financial, community barriers:  o Reports that he picked up [employer] sensor for [employer] of $75/month. He has had it on since Saturday, and notes that he is really enjoying understanding the impact of his diet on glucose readings.  o Received denial of PA from patient's insurance for [place].  DexCom preferred  . Diabetes: uncontrolled; most recent A1c 7.2%   . Current antihyperglycemic regimen: metformin 1000 mg BID, Jardiance 25 mg daily, Basaglar 20 units daily, glipizide 2.5 mg w/ supper if eating a high carb meal o Previous hx DPP4 w/ no benefit, Ozempic w/ no benefit  . Current blood glucose readings: using Libre CGM. Reports that he has had 1 episode of low blood sugar, and often some overnight spikes of hyperglycemia. He notes that the low did not precede the high. . Cardiovascular risk reduction: o Current hypertensive regimen: losartan 25 mg daily o Current hyperlipidemia  regimen: atorvastatin 40 mg daily; LDL at goal <100 . Anxiety: occasional alprazolam 1 mg daily, infrequent use  Pharmacist Clinical Goal(s):  [name] Over the next 90 days, patient will work with PharmD and primary care provider to address optimized glycemic benefit  Interventions: . Comprehensive medication review performed, medication list updated in electronic medical record . Inter-disciplinary care team collaboration (see longitudinal plan of care) . Will determine insurance price for DexCom CGM sensors + transmitter. If more expensive per month than $75, patient prefers to  continue [place]. Provided the customer service phone number if [place] ends before 14 days are finished. Reviewed principals of scanning at least Q8H to capture all data and goal of >70% of time at goal to correspond w/ A1c <7%  Patient Self Care Activities:  . Patient will check blood glucose BID , document, and provide at future appointments . Patient will take medications as prescribed . Patient will report any questions or concerns to provider   Please see past updates related to this goal by clicking on the "Past Updates" button in the selected goal          Plan:  - Will f/u with pharmacy tomorrow regarding DexCom copay.  [name], PharmD, [place], CPP Clinical Pharmacist [place] [phone]

## 2020-04-19 NOTE — Patient Instructions (Signed)
Visit Information  Goals Addressed              This Visit's Progress     Patient Stated   .  "I want to improve my sugars" (pt-stated)        CARE PLAN ENTRY (see longtitudinal plan of care for additional care plan information)  Current Barriers:  . Social, financial, community barriers:  o Reports that he picked up [employer] sensor for [employer] of $75/month. He has had it on since Saturday, and notes that he is really enjoying understanding the impact of his diet on glucose readings.  o Received denial of PA from patient's insurance for [place].  DexCom preferred  . Diabetes: uncontrolled; most recent A1c 7.2%   . Current antihyperglycemic regimen: metformin 1000 mg BID, Jardiance 25 mg daily, Basaglar 20 units daily, glipizide 2.5 mg w/ supper if eating a high carb meal o Previous hx DPP4 w/ no benefit, Ozempic w/ no benefit  . Current blood glucose readings: using Libre CGM. Reports that he has had 1 episode of low blood sugar, and often some overnight spikes of hyperglycemia. He notes that the low did not precede the high. . Cardiovascular risk reduction: o Current hypertensive regimen: losartan 25 mg daily o Current hyperlipidemia regimen: atorvastatin 40 mg daily; LDL at goal <100 . Anxiety: occasional alprazolam 1 mg daily, infrequent use  Pharmacist Clinical Goal(s):  [name] Over the next 90 days, patient will work with PharmD and primary care provider to address optimized glycemic benefit  Interventions: . Comprehensive medication review performed, medication list updated in electronic medical record . Inter-disciplinary care team collaboration (see longitudinal plan of care) . Will determine insurance price for DexCom CGM sensors + transmitter. If more expensive per month than $75, patient prefers to continue [place]. Provided the customer service phone number if [place] ends before 14 days are finished. Reviewed principals of scanning at least Q8H to capture all data and  goal of >70% of time at goal to correspond w/ A1c <7%  Patient Self Care Activities:  . Patient will check blood glucose BID , document, and provide at future appointments . Patient will take medications as prescribed . Patient will report any questions or concerns to provider   Please see past updates related to this goal by clicking on the "Past Updates" button in the selected goal         The patient verbalized understanding of instructions provided today and declined a print copy of patient instruction materials.   Plan:  - Will f/u with pharmacy tomorrow regarding DexCom copay.  [name], PharmD, [place], CPP Clinical Pharmacist [place] [phone]

## 2020-04-20 ENCOUNTER — Ambulatory Visit: Payer: BC Managed Care – PPO | Admitting: Pharmacist

## 2020-04-20 DIAGNOSIS — E1121 Type 2 diabetes mellitus with diabetic nephropathy: Secondary | ICD-10-CM

## 2020-04-20 NOTE — Chronic Care Management (AMB) (Signed)
Chronic Care Management   Follow Up Note   04/20/2020 Name: Andrew Molina MRN: [id number] DOB: Sep 15, 1975  Referred by: [name], MD Reason for referral : Chronic Care Management (Medication Management)   Andrew Molina is a 44 y.o. year old male who is a primary care patient of [name], [name], MD. The CCM team was consulted for assistance with chronic disease management and care coordination needs.    Care coordination follow up.   Review of patient status, including review of consultants reports, relevant laboratory and other test results, and collaboration with appropriate care team members and the patient's provider was performed as part of comprehensive patient evaluation and provision of chronic care management services.    SDOH (Social Determinants of Health) assessments performed: Yes See Care Plan activities for detailed interventions related to [facility])     Outpatient Encounter Medications as of 04/20/2020  Medication Sig Note  . ALPRAZolam (XANAX) 1 MG tablet TAKE 1 TABLET BY MOUTH ONCE DAILY AS NEEDED FOR SLEEP   . atorvastatin (LIPITOR) 20 MG tablet Take 1 tablet by mouth once daily   . Continuous Blood Gluc Sensor (DEXCOM G6 SENSOR) MISC Use to check glucose QID   . Continuous Blood Gluc Sensor (FREESTYLE LIBRE 14 DAY SENSOR) MISC Use to check glucose at least 4 times daily   . Continuous Blood Gluc Transmit (DEXCOM G6 TRANSMITTER) MISC Use to check glucose at least QID   . glipiZIDE (GLUCOTROL) 5 MG tablet Take 0.5-1 tablets (2.5-5 mg total) by mouth daily before supper. 04/13/2020: 2.5 mg PRN for larger meals  . glucose blood test strip Check sugar twice daily. One Touch Ultra strips. Dx: E11.65   . Insulin Glargine (BASAGLAR KWIKPEN) 100 UNIT/ML INJECT 20 UNITS SUBCUTANEOUSLY ONCE DAILY   . JARDIANCE 25 MG TABS tablet Take 1 tablet by mouth once daily   . losartan (COZAAR) 25 MG tablet TAKE 1 TABLET BY MOUTH AT BEDTIME   . metFORMIN (GLUCOPHAGE) 1000 MG tablet Take 1  tablet by mouth twice daily   . naproxen sodium (ANAPROX) 220 MG tablet Take 220 mg by mouth 2 (two) times daily with a meal.    No facility-administered encounter medications on file as of 04/20/2020.     Objective:   Goals Addressed              This Visit's Progress     Patient Stated   .  "I want to improve my sugars" (pt-stated)        CARE PLAN ENTRY (see longtitudinal plan of care for additional care plan information)  Current Barriers:  . Social, financial, community barriers:  o Libre CGM is not covered by insurance, so will be $75/month. F/u on cost for DexCom G6, preferred CGM on his insurance plan . Diabetes: uncontrolled; most recent A1c 7.2%   . Current antihyperglycemic regimen: metformin 1000 mg BID, Jardiance 25 mg daily, Basaglar 20 units daily, glipizide 2.5 mg w/ supper if eating a high carb meal o Previous hx DPP4 w/ no benefit, Ozempic w/ no benefit  . Current blood glucose readings: using Libre CGM, paid out of pocket . Cardiovascular risk reduction: o Current hypertensive regimen: losartan 25 mg daily o Current hyperlipidemia regimen: atorvastatin 40 mg daily; LDL at goal <100 . Anxiety: occasional alprazolam 1 mg daily, infrequent use  Pharmacist Clinical Goal(s):  [name] Over the next 90 days, patient will work with PharmD and primary care provider to address optimized glycemic benefit  Interventions: .  Comprehensive medication review performed, medication list updated in electronic medical record . Inter-disciplinary care team collaboration (see longitudinal plan of care) . Research officer, [employer]. PA required for DexCom G6 sensors and transmitter.  [name] PA submitted, approved, as patient is on insulin therapy. Called [facility] back. Scripts ran through. Cost will be $711 per month. Patient elects to continue [place] 2 sensor w/ $75 cash price. Sent invite email to [place].  Patient Self Care Activities:  . Patient will check blood glucose BID , document,  and provide at future appointments . Patient will take medications as prescribed . Patient will report any questions or concerns to provider   Please see past updates related to this goal by clicking on the "Past Updates" button in the selected goal          Plan:  - Will outreach as previously scheduled  [name], PharmD, [place], [place] Pharmacist [place] [phone]

## 2020-04-20 NOTE — Patient Instructions (Signed)
Visit Information  Goals Addressed              This Visit's Progress     Patient Stated     "I want to improve my sugars" (pt-stated)        CARE PLAN ENTRY (see longtitudinal plan of care for additional care plan information)  Current Barriers:   Social, financial, community barriers:  o Libre CGM is not covered by insurance, so will be $75/month. F/u on cost for DexCom G6, preferred CGM on his insurance plan  Diabetes: uncontrolled; most recent A1c 7.2%    Current antihyperglycemic regimen: metformin 1000 mg BID, Jardiance 25 mg daily, Basaglar 20 units daily, glipizide 2.5 mg w/ supper if eating a high carb meal o Previous hx DPP4 w/ no benefit, Ozempic w/ no benefit   Current blood glucose readings: using Libre CGM, paid out of pocket  Cardiovascular risk reduction: o Current hypertensive regimen: losartan 25 mg daily o Current hyperlipidemia regimen: atorvastatin 40 mg daily; LDL at goal <100  Anxiety: occasional alprazolam 1 mg daily, infrequent use  Pharmacist Clinical Goal(s):   Over the next 90 days, patient will work with PharmD and primary care provider to address optimized glycemic benefit  Interventions:  Comprehensive medication review performed, medication list updated in electronic medical record  Inter-disciplinary care team collaboration (see longitudinal plan of care)  [place]. PA required for DexCom G6 sensors and transmitter.   PA submitted, approved, as patient is on insulin therapy. Called [facility] back. Scripts ran through. Cost will be $711 per month. Patient elects to continue [place] 2 sensor w/ $75 cash price. Sent invite email to [place].  Patient Self Care Activities:   Patient will check blood glucose BID , document, and provide at future appointments  Patient will take medications as prescribed  Patient will report any questions or concerns to provider   Please see past updates related to this goal by clicking  on the "Past Updates" button in the selected goal         The patient verbalized understanding of instructions provided today and declined a print copy of patient instruction materials.   Plan:  - Will outreach as previously scheduled  [name], PharmD, [place], [place] Pharmacist [place] [phone]

## 2020-04-23 ENCOUNTER — Other Ambulatory Visit: Payer: Self-pay | Admitting: Internal Medicine

## 2020-05-01 ENCOUNTER — Other Ambulatory Visit: Payer: Self-pay | Admitting: Internal Medicine

## 2020-05-16 ENCOUNTER — Ambulatory Visit: Payer: BC Managed Care – PPO | Admitting: Pharmacist

## 2020-05-16 DIAGNOSIS — E1169 Type 2 diabetes mellitus with other specified complication: Secondary | ICD-10-CM

## 2020-05-16 DIAGNOSIS — E1165 Type 2 diabetes mellitus with hyperglycemia: Secondary | ICD-10-CM

## 2020-05-16 NOTE — Chronic Care Management (AMB) (Signed)
Chronic Care Management   Follow Up Note   05/16/2020 Name: Andrew Molina MRN: [id number] DOB: 04-Sep-1975  Referred by: [name], MD Reason for referral : Chronic Care Management (Medication Management)   Andrew Molina is a 44 y.o. year old male who is a primary care patient of [name], [name], MD. The CCM team was consulted for assistance with chronic disease management and care coordination needs.    Contacted patient for medication management follow up.  Review of patient status, including review of consultants reports, relevant laboratory and other test results, and collaboration with appropriate care team members and the patient's provider was performed as part of comprehensive patient evaluation and provision of chronic care management services.    SDOH (Social Determinants of Health) assessments performed: No See Care Plan activities for detailed interventions related to [facility])     Outpatient Encounter Medications as of 05/16/2020  Medication Sig Note  . ALPRAZolam (XANAX) 1 MG tablet TAKE 1 TABLET BY MOUTH ONCE DAILY AS NEEDED FOR SLEEP   . atorvastatin (LIPITOR) 20 MG tablet Take 1 tablet by mouth once daily   . Continuous Blood Gluc Sensor (FREESTYLE LIBRE 14 DAY SENSOR) MISC Use to check glucose at least 4 times daily   . glipiZIDE (GLUCOTROL) 5 MG tablet Take 0.5-1 tablets (2.5-5 mg total) by mouth daily before supper. 04/13/2020: 2.5 mg PRN for larger meals  . glucose blood test strip Check sugar twice daily. One Touch Ultra strips. Dx: E11.65   . Insulin Glargine (BASAGLAR KWIKPEN) 100 UNIT/ML INJECT 20 UNITS SUBCUTANEOUSLY ONCE DAILY   . JARDIANCE 25 MG TABS tablet Take 1 tablet by mouth once daily   . losartan (COZAAR) 25 MG tablet TAKE 1 TABLET BY MOUTH AT BEDTIME   . metFORMIN (GLUCOPHAGE) 1000 MG tablet Take 1 tablet by mouth twice daily   . naproxen sodium (ANAPROX) 220 MG tablet Take 220 mg by mouth 2 (two) times daily with a meal.   . [DISCONTINUED] Continuous  Blood Gluc Sensor (DEXCOM G6 SENSOR) MISC Use to check glucose QID   . [DISCONTINUED] Continuous Blood Gluc Transmit (DEXCOM G6 TRANSMITTER) MISC Use to check glucose at least QID    No facility-administered encounter medications on file as of 05/16/2020.     Objective:      Goals Addressed              This Visit's Progress     Patient Stated   .  "I want to improve my sugars" (pt-stated)        CARE PLAN ENTRY (see longtitudinal plan of care for additional care plan information)  Current Barriers:  . Social, financial, community barriers:  o Pensions consultant CGM to help better monitor glucose and impact of meals.  o Does report that one sensor was reading ~60 points lower than fingersticks, so he removed and started a new sensor. Believes the low readings at that time were inaccurate. There were ~5 days in the past 14 days where he was not wearing a sensor . Diabetes: uncontrolled; most recent A1c 7.2%   . Current antihyperglycemic regimen: metformin 1000 mg BID, Jardiance 25 mg daily, Basaglar 20 units daily, glipizide 2.5 mg w/ supper if eating a high carb meal o Previous hx DPP4 w/ no benefit, Ozempic w/ no benefit  . Current blood glucose readings: using Libre CGM Date of Download: 9/15/-05/16/20 % Time CGM is active: 65% Average Glucose: 160 mg/dL Glucose Management Indicator: 7.1%  Glucose Variability:  31.2% (goal <36%) Time in Goal:  - Time in range 70-180: 70% - Time above range: 2% - Time below range: 28% Observed patterns: Post prandial elevations, but inconsistent. Apparent that snacks are causing abrupt elevations . Cardiovascular risk reduction: o Current hypertensive regimen: losartan 25 mg daily, BP at goal at last visit o Current hyperlipidemia regimen: atorvastatin 40 mg daily; LDL at goal <100 . Anxiety: occasional alprazolam 1 mg daily, infrequent use, about 2-3 doses per week  Pharmacist Clinical Goal(s):  [name] Over the next 90 days, patient  will work with PharmD and primary care provider to address optimized glycemic benefit  Interventions: . Comprehensive medication review performed, medication list updated in electronic medical record . Inter-disciplinary care team collaboration (see longitudinal plan of care) . Reviewed results of CGM. Discussed ~10 minute lag time between blood glucose and interstitial glucose readings . Discussed that d/t low-normal fastings, cannot increase Basaglar. Jardiance and metformin are at max doses. Glipizide appears to be used appropriately. Patient will work on being cognizant of any glucose elevations after meals and snacks. Discussed that incorporating proteins in meals and snacks will slow glucose absorption and reduce spikes in glucose. He verbalized understanding  Patient Self Care Activities:  . Patient will check glucose at least QID using CGM, document, and provide at future appointments . Patient will take medications as prescribed . Patient will report any questions or concerns to provider   Please see past updates related to this goal by clicking on the "Past Updates" button in the selected goal          Plan:  - Scheduled f/u call in ~ 8 weeks  [name], PharmD, [place], [place] Pharmacist [place] [phone]

## 2020-05-16 NOTE — Patient Instructions (Signed)
Visit Information  Goals Addressed              This Visit's Progress     Patient Stated   .  "I want to improve my sugars" (pt-stated)        CARE PLAN ENTRY (see longtitudinal plan of care for additional care plan information)  Current Barriers:  . Social, financial, community barriers:  o Pensions consultant CGM to help better monitor glucose and impact of meals.  o Does report that one sensor was reading ~60 points lower than fingersticks, so he removed and started a new sensor. Believes the low readings at that time were inaccurate. There were ~5 days in the past 14 days where he was not wearing a sensor . Diabetes: uncontrolled; most recent A1c 7.2%   . Current antihyperglycemic regimen: metformin 1000 mg BID, Jardiance 25 mg daily, Basaglar 20 units daily, glipizide 2.5 mg w/ supper if eating a high carb meal o Previous hx DPP4 w/ no benefit, Ozempic w/ no benefit  . Current blood glucose readings: using Libre CGM Date of Download: 9/15/-05/16/20 % Time CGM is active: 65% Average Glucose: 160 mg/dL Glucose Management Indicator: 7.1%  Glucose Variability: 31.2% (goal <36%) Time in Goal:  - Time in range 70-180: 70% - Time above range: 2% - Time below range: 28% Observed patterns: Post prandial elevations, but inconsistent. Apparent that snacks are causing abrupt elevations . Cardiovascular risk reduction: o Current hypertensive regimen: losartan 25 mg daily, BP at goal at last visit o Current hyperlipidemia regimen: atorvastatin 40 mg daily; LDL at goal <100 . Anxiety: occasional alprazolam 1 mg daily, infrequent use, about 2-3 doses per week  Pharmacist Clinical Goal(s):  [name] Over the next 90 days, patient will work with PharmD and primary care provider to address optimized glycemic benefit  Interventions: . Comprehensive medication review performed, medication list updated in electronic medical record . Inter-disciplinary care team collaboration (see longitudinal  plan of care) . Reviewed results of CGM. Discussed ~10 minute lag time between blood glucose and interstitial glucose readings . Discussed that d/t low-normal fastings, cannot increase Basaglar. Jardiance and metformin are at max doses. Glipizide appears to be used appropriately. Patient will work on being cognizant of any glucose elevations after meals and snacks. Discussed that incorporating proteins in meals and snacks will slow glucose absorption and reduce spikes in glucose. He verbalized understanding  Patient Self Care Activities:  . Patient will check glucose at least QID using CGM, document, and provide at future appointments . Patient will take medications as prescribed . Patient will report any questions or concerns to provider   Please see past updates related to this goal by clicking on the "Past Updates" button in the selected goal         The patient verbalized understanding of instructions provided today and declined a print copy of patient instruction materials.   Plan:  - Scheduled f/u call in ~ 8 weeks  [name], PharmD, [place], [place] Pharmacist [place] [phone]

## 2020-05-23 ENCOUNTER — Other Ambulatory Visit: Payer: Self-pay | Admitting: Internal Medicine

## 2020-06-02 ENCOUNTER — Other Ambulatory Visit: Payer: Self-pay | Admitting: Internal Medicine

## 2020-06-12 ENCOUNTER — Other Ambulatory Visit: Payer: Self-pay | Admitting: Internal Medicine

## 2020-06-28 ENCOUNTER — Other Ambulatory Visit: Payer: Self-pay | Admitting: Internal Medicine

## 2020-07-05 ENCOUNTER — Other Ambulatory Visit: Payer: Self-pay | Admitting: Internal Medicine

## 2020-07-05 DIAGNOSIS — E1165 Type 2 diabetes mellitus with hyperglycemia: Secondary | ICD-10-CM

## 2020-07-05 DIAGNOSIS — E1121 Type 2 diabetes mellitus with diabetic nephropathy: Secondary | ICD-10-CM

## 2020-07-09 ENCOUNTER — Other Ambulatory Visit: Payer: Self-pay | Admitting: Internal Medicine

## 2020-07-18 ENCOUNTER — Telehealth: Payer: Self-pay | Admitting: Pharmacist

## 2020-07-18 ENCOUNTER — Ambulatory Visit: Payer: BC Managed Care – PPO

## 2020-07-18 NOTE — Telephone Encounter (Signed)
  Chronic Care Management   Note  07/18/2020 Name: DENIEL MCQUISTON MRN: [id number] DOB: 03-30-1976   Patient did not present for scheduled visit today.    Plan: - If I do not hear back from the patient by end of business today, will collaborate with Care Guide to outreach to schedule follow up with me  [name], PharmD, [place], [place] Pharmacist [employer] [facility] [employer] [phone]

## 2020-07-31 ENCOUNTER — Other Ambulatory Visit: Payer: Self-pay | Admitting: Internal Medicine

## 2020-07-31 DIAGNOSIS — E1165 Type 2 diabetes mellitus with hyperglycemia: Secondary | ICD-10-CM

## 2020-07-31 DIAGNOSIS — E1121 Type 2 diabetes mellitus with diabetic nephropathy: Secondary | ICD-10-CM

## 2020-08-19 ENCOUNTER — Other Ambulatory Visit: Payer: Self-pay | Admitting: Internal Medicine

## 2020-08-30 ENCOUNTER — Encounter: Payer: Self-pay | Admitting: *Deleted

## 2020-09-01 ENCOUNTER — Other Ambulatory Visit: Payer: Self-pay | Admitting: Internal Medicine

## 2020-09-01 DIAGNOSIS — E1121 Type 2 diabetes mellitus with diabetic nephropathy: Secondary | ICD-10-CM

## 2020-09-01 DIAGNOSIS — E1165 Type 2 diabetes mellitus with hyperglycemia: Secondary | ICD-10-CM

## 2020-09-01 NOTE — Telephone Encounter (Signed)
RX Refill:xanax Last Seen:03-14-20 Last ordered:02-09-20

## 2020-09-20 ENCOUNTER — Ambulatory Visit: Payer: BC Managed Care – PPO | Admitting: Internal Medicine

## 2020-09-20 ENCOUNTER — Encounter: Payer: Self-pay | Admitting: Internal Medicine

## 2020-09-20 ENCOUNTER — Other Ambulatory Visit: Payer: Self-pay

## 2020-09-20 VITALS — BP 122/78 | HR 76 | Temp 98.6°F | Resp 14 | Ht 70.0 in | Wt 173.2 lb

## 2020-09-20 DIAGNOSIS — E1169 Type 2 diabetes mellitus with other specified complication: Secondary | ICD-10-CM | POA: Diagnosis not present

## 2020-09-20 DIAGNOSIS — Z113 Encounter for screening for infections with a predominantly sexual mode of transmission: Secondary | ICD-10-CM

## 2020-09-20 DIAGNOSIS — E785 Hyperlipidemia, unspecified: Secondary | ICD-10-CM

## 2020-09-20 DIAGNOSIS — M5386 Other specified dorsopathies, lumbar region: Secondary | ICD-10-CM

## 2020-09-20 DIAGNOSIS — K760 Fatty (change of) liver, not elsewhere classified: Secondary | ICD-10-CM | POA: Diagnosis not present

## 2020-09-20 DIAGNOSIS — E1121 Type 2 diabetes mellitus with diabetic nephropathy: Secondary | ICD-10-CM

## 2020-09-20 LAB — COMPREHENSIVE METABOLIC PANEL
ALT: 33 U/L (ref 0–53)
AST: 20 U/L (ref 0–37)
Albumin: 4.8 g/dL (ref 3.5–5.2)
Alkaline Phosphatase: 55 U/L (ref 39–117)
BUN: 18 mg/dL (ref 6–23)
CO2: 27 mEq/L (ref 19–32)
Calcium: 10.4 mg/dL (ref 8.4–10.5)
Chloride: 102 mEq/L (ref 96–112)
Creatinine, Ser: 0.68 mg/dL (ref 0.40–1.50)
GFR: 112.75 mL/min (ref >60.00)
Glucose, Bld: 151 mg/dL — ABNORMAL HIGH (ref 70–99)
Potassium: 4.8 mEq/L (ref 3.5–5.1)
Sodium: 138 mEq/L (ref 135–145)
Total Bilirubin: 0.5 mg/dL (ref 0.2–1.2)
Total Protein: 7.2 g/dL (ref 6.0–8.3)

## 2020-09-20 LAB — LIPID PANEL
Cholesterol: 206 mg/dL — ABNORMAL HIGH (ref 0–200)
HDL: 42.4 mg/dL (ref >39.00)
NonHDL: 163.93
Total CHOL/HDL Ratio: 5
Triglycerides: 230 mg/dL — ABNORMAL HIGH (ref 0.0–149.0)
VLDL: 46 mg/dL — ABNORMAL HIGH (ref 0.0–40.0)

## 2020-09-20 LAB — LDL CHOLESTEROL, DIRECT: Direct LDL: 125 mg/dL

## 2020-09-20 LAB — POCT GLYCOSYLATED HEMOGLOBIN (HGB A1C): Hemoglobin A1C: 7.2 % — AB (ref 4.0–5.6)

## 2020-09-20 MED ORDER — INSULIN PEN NEEDLE 33G X 6 MM MISC: 1 refills | Status: DC

## 2020-09-20 NOTE — Assessment & Plan Note (Signed)
Managed with statin, low glycemic index  and control of diabetes,  Liver enzymes are due .  Advised to limit use of nsaids, and add tylenol up to 1000 mg daly if needed    Lab Results  Component Value Date   ALT 32 03/09/2020   AST 23 03/09/2020   ALKPHOS 53 03/09/2020   BILITOT 0.5 03/09/2020

## 2020-09-20 NOTE — Assessment & Plan Note (Signed)
LDL is now at  goal on current medications;   liver enzymes are  Due for repeat.   No changes today.   Lab Results  Component Value Date   CHOL 144 11/22/2019   HDL 40.70 11/22/2019   LDLCALC 85 11/22/2019   LDLDIRECT 100.0 02/11/2019   TRIG 95.0 11/22/2019   CHOLHDL 4 11/22/2019   Lab Results  Component Value Date   ALT 32 03/09/2020   AST 23 03/09/2020   ALKPHOS 53 03/09/2020   BILITOT 0.5 03/09/2020

## 2020-09-20 NOTE — Progress Notes (Signed)
Subjective:  Patient ID: Andrew Molina, male    DOB: 09-16-1975  Age: 45 y.o. MRN: [id number]  CC: The primary encounter diagnosis was Diabetes mellitus with microalbuminuric diabetic nephropathy ([place]). Diagnoses of Screen for STD (sexually transmitted disease), NAFLD (nonalcoholic fatty liver disease), Hyperlipidemia associated with type 2 diabetes mellitus ([place]), and Sciatica of right side associated with disorder of lumbar spine were also pertinent to this visit.  HPI GRAESYN SCHREIFELS presents for follow up on type 2   This visit occurred during the SARS-CoV-2 public health emergency.  Safety protocols were in place, including screening questions prior to the visit, additional usage of staff PPE, and extensive cleaning of exam room while observing appropriate contact time as indicated for disinfecting solutions.    T2DM:  Last a1c August 2021.  He feels generally well, is exercising several times per week and checking blood sugars once daily at variable times.  BS have been under 130 fasting and < 150 post prandially.  Denies any recent hypoglyemic events.  Taking his medications as directed. Following a carbohydrate modified diet 6 days per week. Denies numbness, burning and tingling of extremities. Appetite is good.   Having trouble getting replacement parts / materials for his Freestyle Libre CBG monitor   HAD COVID POSITIVE HOME TEST IN December WHEN FAMILY WAS ILL . Had no symptoms.   TAKES ALEVE ONCE DAILY IN AM FOR BACK PAIN.  No regular use of tylenol       Outpatient Medications Prior to Visit  Medication Sig Dispense Refill  . ALPRAZolam (XANAX) 1 MG tablet TAKE 1 TABLET BY MOUTH ONCE DAILY AS NEEDED FOR SLEEP 30 tablet 0  . atorvastatin (LIPITOR) 20 MG tablet Take 1 tablet by mouth once daily 30 tablet 0  . Continuous Blood Gluc Sensor (FREESTYLE LIBRE 14 DAY SENSOR) MISC USE TO CHECK GLUCOSE AT LEAST 4 TIMES DAILY 2 each 0  . glipiZIDE (GLUCOTROL) 5 MG tablet Take 0.5-1 tablets  (2.5-5 mg total) by mouth daily before supper. 90 tablet 1  . glucose blood test strip Check sugar twice daily. One Touch Ultra strips. Dx: E11.65 200 each 5  . Insulin Glargine (BASAGLAR KWIKPEN) 100 UNIT/ML INJECT 20 UNITS SUBCUTANEOUSLY ONCE DAILY 15 mL 0  . JARDIANCE 25 MG TABS tablet Take 1 tablet by mouth once daily 90 tablet 0  . losartan (COZAAR) 25 MG tablet TAKE 1 TABLET BY MOUTH AT BEDTIME 90 tablet 0  . metFORMIN (GLUCOPHAGE) 1000 MG tablet Take 1 tablet by mouth twice daily 180 tablet 0  . naproxen sodium (ANAPROX) 220 MG tablet Take 220 mg by mouth 2 (two) times daily with a meal.     No facility-administered medications prior to visit.    Review of Systems;  Patient denies headache, fevers, malaise, unintentional weight loss, skin rash, eye pain, sinus congestion and sinus pain, sore throat, dysphagia,  hemoptysis , cough, dyspnea, wheezing, chest pain, palpitations, orthopnea, edema, abdominal pain, nausea, melena, diarrhea, constipation, flank pain, dysuria, hematuria, urinary  Frequency, nocturia, numbness, tingling, seizures,  Focal weakness, Loss of consciousness,  Tremor, insomnia, depression, anxiety, and suicidal ideation.      Objective:  BP 122/78 (BP Location: Left Arm, Patient Position: Sitting, Cuff Size: Normal)   Pulse 76   Temp 98.6 F (37 C) (Oral)   Resp 14   Ht 5' 10"  (1.778 m)   Wt 173 lb 3.2 oz (78.6 kg)   SpO2 97%   BMI 24.85 kg/m   BP Readings  from Last 3 Encounters:  09/20/20 122/78  03/14/20 108/64  02/04/20 130/68    Wt Readings from Last 3 Encounters:  09/20/20 173 lb 3.2 oz (78.6 kg)  03/14/20 171 lb (77.6 kg)  02/04/20 170 lb (77.1 kg)    General appearance: alert, cooperative and appears stated age Ears: normal TM's and external ear canals both ears Throat: lips, mucosa, and tongue normal; teeth and gums normal Neck: no adenopathy, no carotid bruit, supple, symmetrical, trachea midline and thyroid not enlarged, symmetric, no  tenderness/mass/nodules Back: symmetric, no curvature. ROM normal. No CVA tenderness. Lungs: clear to auscultation bilaterally Heart: regular rate and rhythm, S1, S2 normal, no murmur, click, rub or gallop Abdomen: soft, non-tender; bowel sounds normal; no masses,  no organomegaly Pulses: 2+ and symmetric Skin: Skin color, texture, turgor normal. No rashes or lesions Lymph nodes: Cervical, supraclavicular, and axillary nodes normal.  Lab Results  Component Value Date   HGBA1C 7.2 (A) 09/20/2020   HGBA1C 7.2 (H) 03/09/2020   HGBA1C 7.5 (H) 11/29/2019    Lab Results  Component Value Date   CREATININE 0.65 03/09/2020   CREATININE 0.65 11/22/2019   CREATININE 0.69 08/20/2019    Lab Results  Component Value Date   WBC 6.6 02/27/2016   HGB 13.4 02/27/2016   HCT 39.1 02/27/2016   PLT 250.0 02/27/2016   GLUCOSE 180 (H) 03/09/2020   CHOL 144 11/22/2019   TRIG 95.0 11/22/2019   HDL 40.70 11/22/2019   LDLDIRECT 100.0 02/11/2019   LDLCALC 85 11/22/2019   ALT 32 03/09/2020   AST 23 03/09/2020   NA 137 03/09/2020   K 4.1 03/09/2020   CL 104 03/09/2020   CREATININE 0.65 03/09/2020   BUN 17 03/09/2020   CO2 26 03/09/2020   TSH 1.92 08/14/2016   HGBA1C 7.2 (A) 09/20/2020   MICROALBUR 16.6 (H) 08/20/2019    DG Facial Bones 1-2 Views  Result Date: 02/18/2019 CLINICAL DATA:  Foreign body post injury 1.5 months ago. EXAM: FACIAL BONES - 1-2 VIEW COMPARISON:  None. FINDINGS: There is no evidence of fracture or other significant bone abnormality. No orbital emphysema or sinus air-fluid levels are seen. There is a 6 mm metallic fragment within the soft tissues overlying the left zygomatic arch. IMPRESSION: 6 mm metallic foreign body within the soft tissues overlying the left zygomatic arch. Electronically Signed   By: Fidela Salisbury M.D.   On: 02/18/2019 14:33    Assessment & Plan:   Problem List Items Addressed This Visit      Unprioritized   Diabetes mellitus with  microalbuminuric diabetic nephropathy ([place]) - Primary    Stable control with marked improvement once the CBG monitor was made available.  Continue glipiizidem jardiance Basalar for glycemic control,  ARB for nephropathy , and statin  Lab Results  Component Value Date   HGBA1C 7.2 (A) 09/20/2020   Lab Results  Component Value Date   MICROALBUR 16.6 (H) 08/20/2019   MICROALBUR 5.7 (H) 05/13/2018           Relevant Orders   POCT HgB A1C (Completed)   Comprehensive metabolic panel   Lipid panel   Hyperlipidemia associated with type 2 diabetes mellitus ([place])    LDL is now at  goal on current medications;   liver enzymes are  Due for repeat.   No changes today.   Lab Results  Component Value Date   CHOL 144 11/22/2019   HDL 40.70 11/22/2019   LDLCALC 85 11/22/2019   LDLDIRECT 100.0 02/11/2019  TRIG 95.0 11/22/2019   CHOLHDL 4 11/22/2019   Lab Results  Component Value Date   ALT 32 03/09/2020   AST 23 03/09/2020   ALKPHOS 53 03/09/2020   BILITOT 0.5 03/09/2020         NAFLD (nonalcoholic fatty liver disease)    Managed with statin, low glycemic index  and control of diabetes,  Liver enzymes are due .  Advised to limit use of nsaids, and add tylenol up to 1000 mg daly if needed    Lab Results  Component Value Date   ALT 32 03/09/2020   AST 23 03/09/2020   ALKPHOS 53 03/09/2020   BILITOT 0.5 03/09/2020        Sciatica of right side associated with disorder of lumbar spine    Plain films note disk space narrowing at L4 .  Symptoms are intermittent and non radiating most of the time.  Using aleve once daily  adised to limit nsaid use and add tylenol if needed  Max dose 1000 mg daily if used chronically        Other Visit Diagnoses    Screen for STD (sexually transmitted disease)       Relevant Orders   Hepatitis C antibody   HIV Antibody (routine testing w rflx)      I am having Trinton B. Dulworth start on Insulin Pen Needle. I am also having him maintain his  naproxen sodium, glucose blood, glipiZIDE, Jardiance, losartan, atorvastatin, metFORMIN, FreeStyle Libre 14 Day Sensor, Basaglar KwikPen, and ALPRAZolam.  Meds ordered this encounter  Medications  . Insulin Pen Needle 33G X 6 MM MISC    Sig: FOR USE WITH BASAGLAR KWIK PEN ONCE DAILY    Dispense:  100 each    Refill:  1   I provided  30 minutes of  face-to-face time during this encounter reviewing patient's current problems and past surgeries, labs and imaging studies, providing counseling on the above mentioned problems , and coordination  of care .  There are no discontinued medications.  Follow-up: Return in about 6 months (around 03/20/2021).   [name], MD

## 2020-09-20 NOTE — Patient Instructions (Addendum)
Your a1c has remained stable at 7.2  No changes are recommended today , except switch to 5 mg glipizide tablets and cut in half    You can add up to 1000 mg of acetominophen (tylenol) every day safely  In divided doses (500 mg every 6 hours  Or 1000 mg every 12 hours.) for back pain .  It's actually safer than NSAIDs for patients with fatty liver

## 2020-09-20 NOTE — Assessment & Plan Note (Addendum)
Stable control with marked improvement once the CBG monitor was made available.  Continue glipiizidem jardiance Basalar for glycemic control,  ARB for nephropathy , and statin  Lab Results  Component Value Date   HGBA1C 7.2 (A) 09/20/2020   Lab Results  Component Value Date   MICROALBUR 16.6 (H) 08/20/2019   MICROALBUR 5.7 (H) 05/13/2018

## 2020-09-20 NOTE — Assessment & Plan Note (Signed)
Plain films note disk space narrowing at L4 .  Symptoms are intermittent and non radiating most of the time.  Using aleve once daily  adised to limit nsaid use and add tylenol if needed  Max dose 1000 mg daily if used chronically

## 2020-09-21 ENCOUNTER — Other Ambulatory Visit: Payer: Self-pay | Admitting: Internal Medicine

## 2020-09-21 LAB — HEPATITIS C ANTIBODY
Hepatitis C Ab: NONREACTIVE
SIGNAL TO CUT-OFF: 0 (ref <1.00)

## 2020-09-21 LAB — HIV ANTIBODY (ROUTINE TESTING W REFLEX): HIV 1&2 Ab, 4th Generation: NONREACTIVE

## 2020-09-21 MED ORDER — ATORVASTATIN CALCIUM 20 MG PO TABS: 20.0000 mg | ORAL_TABLET | Freq: Every day | ORAL | 1 refills | Status: DC

## 2020-09-26 ENCOUNTER — Other Ambulatory Visit: Payer: Self-pay | Admitting: Internal Medicine

## 2020-09-26 NOTE — Telephone Encounter (Signed)
RX Refill:xanax Last Seen:09-21-19 Last ordered:09-02-19

## 2020-09-26 NOTE — Progress Notes (Signed)
Patient has been scheduled

## 2020-09-29 ENCOUNTER — Other Ambulatory Visit: Payer: Self-pay | Admitting: Internal Medicine

## 2020-10-01 ENCOUNTER — Other Ambulatory Visit: Payer: Self-pay | Admitting: Internal Medicine

## 2020-10-01 DIAGNOSIS — E1121 Type 2 diabetes mellitus with diabetic nephropathy: Secondary | ICD-10-CM

## 2020-10-01 DIAGNOSIS — E1165 Type 2 diabetes mellitus with hyperglycemia: Secondary | ICD-10-CM

## 2020-10-20 ENCOUNTER — Ambulatory Visit: Payer: BC Managed Care – PPO | Admitting: Pharmacist

## 2020-10-20 DIAGNOSIS — E1165 Type 2 diabetes mellitus with hyperglycemia: Secondary | ICD-10-CM

## 2020-10-20 DIAGNOSIS — M5386 Other specified dorsopathies, lumbar region: Secondary | ICD-10-CM

## 2020-10-20 DIAGNOSIS — I10 Essential (primary) hypertension: Secondary | ICD-10-CM

## 2020-10-20 DIAGNOSIS — E1169 Type 2 diabetes mellitus with other specified complication: Secondary | ICD-10-CM

## 2020-10-20 DIAGNOSIS — F411 Generalized anxiety disorder: Secondary | ICD-10-CM

## 2020-10-20 NOTE — Patient Instructions (Signed)
Visit Information  Goals Addressed              This Visit's Progress     Patient Stated   .  Medication Monitoring (pt-stated)        Patient Goals/Self-Care Activities . Over the next 90 days, patient will:  - take medications as prescribed check glucose at least three times daily using CGM, document, and provide at future appointments engage in dietary modifications by reducing carbohydrate contents in lunch meals       Patient verbalizes understanding of instructions provided today and agrees to view in [place].    Plan: Telephone follow up appointment with care management team member scheduled for:  ~ 10 weeks  [name], PharmD, [place], [place] Clinical Pharmacist [employer] at [employer] [phone]

## 2020-10-20 NOTE — Chronic Care Management (AMB) (Signed)
Care Management   Pharmacy Note  10/20/2020 Name: SERGI GELLNER MRN: [id number] DOB: February 14, 1976  Subjective: Andrew Molina is a 45 y.o. year old male who is a primary care patient of [name], MD. The Care Management team was consulted for assistance with care management and care coordination needs.    Engaged with patient by telephone for follow up visit in response to provider referral for pharmacy case management and/or care coordination services.   The patient was given information about Care Management services today including:  1. Care Management services includes personalized support from designated clinical staff supervised by the patient's primary care provider, including individualized plan of care and coordination with other care providers. 2. 24/7 contact phone numbers for assistance for urgent and routine care needs. 3. The patient may stop case management services at any time by phone call to the office staff.  Patient agreed to services and consent obtained.  Assessment:  Review of patient status, including review of consultants reports, laboratory and other test data, was performed as part of comprehensive evaluation and provision of chronic care management services.   SDOH (Social Determinants of Health) assessments and interventions performed:  SDOH Interventions   Flowsheet Row Most Recent Value  SDOH Interventions   Food Insecurity Interventions Intervention Not Indicated  Financial Strain Interventions Intervention Not Indicated       Objective:  Lab Results  Component Value Date   CREATININE 0.68 09/20/2020   CREATININE 0.65 03/09/2020   CREATININE 0.65 11/22/2019    Lab Results  Component Value Date   HGBA1C 7.2 (A) 09/20/2020       Component Value Date/Time   CHOL 206 (H) 09/20/2020 0920   TRIG 230.0 (H) 09/20/2020 0920   HDL 42.40 09/20/2020 0920   CHOLHDL 5 09/20/2020 0920   VLDL 46.0 (H) 09/20/2020 0920   [place] 85 11/22/2019 0819    LDLDIRECT 125.0 09/20/2020 0920    Clinical ASCVD: No  The 10-year ASCVD risk score Mikey Bussing DC Jr., et al., 2013) is: 4.8%   Values used to calculate the score:     Age: 43 years     Sex: Male     Is Non-Hispanic African American: No     Diabetic: Yes     Tobacco smoker: No     Systolic Blood Pressure: 676 mmHg     Is BP treated: Yes     HDL Cholesterol: 42.4 mg/dL     Total Cholesterol: 206 mg/dL     BP Readings from Last 3 Encounters:  09/20/20 122/78  03/14/20 108/64  02/04/20 130/68    Care Plan  Allergies  Allergen Reactions   Prednisone     Jittery.  Insomnia.   Venlafaxine Anxiety    Increased irritability    Medications Reviewed Today    Reviewed by De Hollingshead, RPH-CPP (Pharmacist) on 10/20/20 at 1123  Med List Status: <None>  Medication Order Taking? Sig Documenting Provider Last Dose Status Informant  ALPRAZolam (XANAX) 1 MG tablet [id number] Yes TAKE 1 TABLET BY MOUTH ONCE DAILY AS NEEDED FOR SLEEP -  LAST  REFILL  WITHOUT  AN  APPT [name], MD Taking Active   atorvastatin (LIPITOR) 20 MG tablet [id number] Yes Take 1 tablet (20 mg total) by mouth daily. [name], MD Taking Active   Continuous Blood Gluc Sensor (FREESTYLE LIBRE 14 DAY SENSOR) [place] [id number] Yes USE TO CHECK GLUCOSE AT LEAST 4 TIMES DAILY - CHANGE SENSOR EVERY 14 DAYS [name],  [name], MD Taking Active   glipiZIDE (GLUCOTROL) 5 MG tablet [id number] Yes Take 0.5-1 tablets (2.5-5 mg total) by mouth daily before supper. [name], MD Taking Active            Med Note Nat Christen Apr 13, 2020  1:04 PM) 2.5 mg PRN for larger meals  glucose blood test strip [id number] Yes Check sugar twice daily. One Touch Ultra strips. Dx: E11.65 [name], MD Taking Active   Insulin Glargine [facility]) 100 UNIT/ML [id number] Yes INJECT 20 UNITS SUBCUTANEOUSLY ONCE DAILY [name], MD Taking Active   Insulin Pen Needle 33G X 6 MM MISC [id number] Yes FOR USE WITH  [place] KWIK PEN ONCE DAILY [name], MD Taking Active   JARDIANCE 25 MG TABS tablet [id number] Yes Take 1 tablet by mouth once daily [name], MD Taking Active   losartan (COZAAR) 25 MG tablet [id number] Yes TAKE 1 TABLET BY MOUTH AT BEDTIME [name], MD Taking Active   metFORMIN (GLUCOPHAGE) 1000 MG tablet [id number] Yes Take 1 tablet by mouth twice daily [name], MD Taking Active   naproxen sodium (ANAPROX) 220 MG tablet [id number]  Take 220 mg by mouth 2 (two) times daily with a meal. [provider]  Active            Med Note [name]   Fri Jan 14, 2020 11:25 AM)            Patient Active Problem List   Diagnosis Date Noted   Hypertriglyceridemia 02/16/2019   Sciatica of right side associated with disorder of lumbar spine 02/11/2019   Diabetes mellitus with microalbuminuric diabetic nephropathy ([place]) 02/11/2018   Solitary pulmonary nodule 05/11/2016   Nephrolithiasis 02/27/2016   Numbness and tingling of foot 03/22/2013   History of tobacco abuse 02/22/2013   Generalized anxiety disorder 11/23/2012   Gastritis due to nonsteroidal anti-inflammatory drug 05/06/2012   NAFLD (nonalcoholic fatty liver disease) 09/22/2011   Hyperlipidemia associated with type 2 diabetes mellitus ([place])     Conditions to be addressed/monitored: HTN, HLD and DMII  Care Plan : Medication Monitoring  Updates made by De Hollingshead, RPH-CPP since 10/20/2020 12:00 AM    Problem: Diabetes, HTN, HLD     Long-Range Goal: Disease Progression Prevention   Start Date: 10/20/2020  This Visit's Progress: On track  Priority: High  Note:   Current Barriers:   Unable to achieve control of diabetes   Pharmacist Clinical Goal(s):   Over the next 90 days, patient will achieve control of diabetes as evidenced by A1c  through collaboration with PharmD and provider.   Interventions:  1:1 collaboration with [name], MD regarding development  and update of comprehensive plan of care as evidenced by provider attestation and co-signature  Inter-disciplinary care team collaboration (see longitudinal plan of care)  Comprehensive medication review performed; medication list updated in electronic medical record  Diabetes:  Uncontrolled current treatment: metformin 1000 mg BID, Jardiance 25 mg daily, Basaglar 20 units daily, glipizide 2.5 mg with larger suppers o Hx reporting no benefit w/ DPP4, Ozempic   Current glucose readings:  Date of Download: 2/19-3/18/22 % Time CGM is active: 32% (<70% limits accuracy of interpretation) Average Glucose: 163 mg/dL Glucose Management Indicator: 7.2  Glucose Variability: 31.1 (goal <36%) Time in Goal:  - Time in range 70-180: 70% - Time above range: 30% - Time below range: 0% Observed patterns:  Reports  hypoglycemic symptoms if he takes glipizide and eats less than he anticipated  Current meal patterns: breakfast: 2 atkins bars; lunch: variable, eats out, sometimes comes home for leftovers; dinner: they don't cook much, get take out - reports steaks, vegetables, pizza, other choices that he knows he should be limited;   Current exercise: no targeted physical activity; long work days  Educated on goal A1c, goal fasting, goal 2 hour post prandial, goal time in target  Reviewed CGM. Post lunch elevations appear to be the most aberrant time.   Extensive dietary discussion regarding options to improve post lunch glucose. Suggested setting goals of eating healthier foods with lunch. Will provide resources.   Hypertension:  Controlled per last office reading;  current treatment: losartan 25 mg QPM   Recommend to continue current regimen at this time  Hyperlipidemia:  Uncontrolled; current treatment: atorvastatin 40 mg daily   Recent lab work has shown worsening of cholesterol control. Patient notes he is generally adherent to the medication.   Discussed importance of adherence for  reduction in long term risk of cardiovascular disease.   Consider dose increase moving forward  Anxiety:  Uncontrolled; reports use of alprazolam 1 mg PRN, using several times weekly for work-related stress. Endorses this is more frequent than previously.   Consider benefit vs side effects for initiation of option like SSRI/SNRI, or consideration of mental health support to reduce need for benzodiazepine  Back Pain:  Uncontrolled; reports daily use of naproxen. Denies hx voltaren gel or lidocaine patches  Suggested OTC voltaren gel or lidocaine patches to reduce need for daily NSAID therapy given potential renal and GI side effects.   Patient Goals/Self-Care Activities  Over the next 90 days, patient will:  - take medications as prescribed check glucose at least three times daily using CGM, document, and provide at future appointments engage in dietary modifications by reducing carbohydrate contents in lunch meals  Follow Up Plan: Telephone follow up appointment with care management team member scheduled for: ~10 weeks     Medication Assistance:  None required.  Patient affirms current coverage meets needs.  Follow Up:  Patient agrees to Care Plan and Follow-up.  Plan: Telephone follow up appointment with care management team member scheduled for:  ~ 10 weeks  [name], PharmD, [place], [place] Clinical Pharmacist [employer] at [employer] [phone]

## 2020-10-24 ENCOUNTER — Other Ambulatory Visit: Payer: Self-pay

## 2020-10-24 MED ORDER — FREESTYLE LIBRE 2 SENSOR MISC: 5 refills | Status: DC

## 2020-10-24 MED ORDER — FREESTYLE LIBRE 2 READER DEVI: 0 refills | Status: DC

## 2020-11-10 ENCOUNTER — Other Ambulatory Visit: Payer: Self-pay | Admitting: Internal Medicine

## 2020-11-19 ENCOUNTER — Other Ambulatory Visit: Payer: Self-pay | Admitting: Internal Medicine

## 2020-12-06 ENCOUNTER — Other Ambulatory Visit: Payer: Self-pay | Admitting: Internal Medicine

## 2020-12-06 DIAGNOSIS — E1121 Type 2 diabetes mellitus with diabetic nephropathy: Secondary | ICD-10-CM

## 2020-12-22 ENCOUNTER — Telehealth: Payer: Self-pay | Admitting: Pharmacist

## 2020-12-22 ENCOUNTER — Telehealth: Payer: BC Managed Care – PPO

## 2020-12-22 NOTE — Telephone Encounter (Signed)
  Chronic Care Management   Note  12/22/2020 Name: AHAAN ZOBRIST MRN: [id number] DOB: [birth year]   Attempted to contact patient for scheduled appointment for medication management support. Patient had forgotten about our appointment and was unable to talk. Rescheduled appointment.   [name], PharmD, [place], [place] Clinical Pharmacist [employer] at [place]

## 2020-12-26 ENCOUNTER — Other Ambulatory Visit: Payer: Self-pay | Admitting: Internal Medicine

## 2021-01-03 ENCOUNTER — Telehealth: Payer: BC Managed Care – PPO

## 2021-01-10 ENCOUNTER — Ambulatory Visit: Payer: BC Managed Care – PPO | Admitting: Pharmacist

## 2021-01-10 DIAGNOSIS — E1169 Type 2 diabetes mellitus with other specified complication: Secondary | ICD-10-CM

## 2021-01-10 DIAGNOSIS — E1165 Type 2 diabetes mellitus with hyperglycemia: Secondary | ICD-10-CM

## 2021-01-10 DIAGNOSIS — I10 Essential (primary) hypertension: Secondary | ICD-10-CM

## 2021-01-10 DIAGNOSIS — K760 Fatty (change of) liver, not elsewhere classified: Secondary | ICD-10-CM

## 2021-01-10 NOTE — Patient Instructions (Signed)
Visit Information  Goals Addressed              This Visit's Progress     Patient Stated   .  Medication Monitoring (pt-stated)        Patient Goals/Self-Care Activities . Over the next 90 days, patient will:  - take medications as prescribed check glucose at least three times daily using CGM, document, and provide at future appointments engage in dietary modifications by reducing carbohydrate contents in lunch meals        Patient verbalizes understanding of instructions provided today and agrees to view in [place].  Plan: Telephone follow up appointment with care management team member scheduled for:  ~ 12 weeks  [name], PharmD, [place], [place] Clinical Pharmacist [employer] at [employer] [phone]

## 2021-01-10 NOTE — Chronic Care Management (AMB) (Signed)
Care Management   Pharmacy Note  01/10/2021 Name: Andrew Molina MRN: [id number] DOB: 14-Nov-1975  Subjective: Andrew Molina is a 45 y.o. year old male who is a primary care patient of [name], MD. The Care Management team was consulted for assistance with care management and care coordination needs.    Engaged with patient by telephone for follow up visit in response to provider referral for pharmacy case management and/or care coordination services.   The patient was given information about Care Management services today including:  1. Care Management services includes personalized support from designated clinical staff supervised by the patient's primary care provider, including individualized plan of care and coordination with other care providers. 2. 24/7 contact phone numbers for assistance for urgent and routine care needs. 3. The patient may stop case management services at any time by phone call to the office staff.  Patient agreed to services and consent obtained.  Assessment:  Review of patient status, including review of consultants reports, laboratory and other test data, was performed as part of comprehensive evaluation and provision of chronic care management services.   SDOH (Social Determinants of Health) assessments and interventions performed:    Objective:  Lab Results  Component Value Date   CREATININE 0.68 09/20/2020   CREATININE 0.65 03/09/2020   CREATININE 0.65 11/22/2019    Lab Results  Component Value Date   HGBA1C 7.2 (A) 09/20/2020       Component Value Date/Time   CHOL 206 (H) 09/20/2020 0920   TRIG 230.0 (H) 09/20/2020 0920   HDL 42.40 09/20/2020 0920   CHOLHDL 5 09/20/2020 0920   VLDL 46.0 (H) 09/20/2020 0920   LDLCALC 85 11/22/2019 0819   LDLDIRECT 125.0 09/20/2020 0920     Clinical ASCVD: No  The 10-year ASCVD risk score Mikey Bussing DC Jr., et al., 2013) is: 5.4%   Values used to calculate the score:     Age: 89 years     Sex: Male      Is Non-Hispanic African American: No     Diabetic: Yes     Tobacco smoker: No     Systolic Blood Pressure: 527 mmHg     Is BP treated: Yes     HDL Cholesterol: 42.4 mg/dL     Total Cholesterol: 206 mg/dL      BP Readings from Last 3 Encounters:  09/20/20 122/78  03/14/20 108/64  02/04/20 130/68    Care Plan  Allergies  Allergen Reactions  . Prednisone     Jittery.  Insomnia.  . Venlafaxine Anxiety    Increased irritability    Medications Reviewed Today    Reviewed by Adair Laundry, CMA (Certified Medical Assistant) on 10/24/20 at [phone]  Med List Status: <None>  Medication Order Taking? Sig Documenting Provider Last Dose Status Informant  ALPRAZolam (XANAX) 1 MG tablet [id number] No TAKE 1 TABLET BY MOUTH ONCE DAILY AS NEEDED FOR SLEEP -  LAST  REFILL  WITHOUT  AN  APPT [name], MD Taking Active   atorvastatin (LIPITOR) 20 MG tablet [id number] No Take 1 tablet (20 mg total) by mouth daily. [name], MD Taking Active   Continuous Blood Gluc Receiver (FREESTYLE LIBRE 2 READER) DEVI [id number] Yes Use to check glucose at least 4 times daily [name], MD  Active         Discontinued 10/24/20 0822   Continuous Blood Gluc Sensor (FREESTYLE LIBRE 2 SENSOR) MISC [id number] Yes Use to check glucose at least  4 times daily- Change sensor every 14 days. [name], MD  Active   glipiZIDE (GLUCOTROL) 5 MG tablet [id number] No Take 0.5-1 tablets (2.5-5 mg total) by mouth daily before supper. [name], MD Taking Active            Med Note Nat Christen Apr 13, 2020  1:04 PM) 2.5 mg PRN for larger meals  glucose blood test strip [id number] No Check sugar twice daily. One Touch Ultra strips. Dx: E11.65 [name], MD Taking Active   Insulin Glargine [facility]) 100 UNIT/ML [id number] No INJECT 20 UNITS SUBCUTANEOUSLY ONCE DAILY [name], MD Taking Active   Insulin Pen Needle 33G X 6 MM MISC [id number] No FOR USE WITH [place] KWIK PEN  ONCE DAILY [name], MD Taking Active   JARDIANCE 25 MG TABS tablet [id number] No Take 1 tablet by mouth once daily [name], MD Taking Active   losartan (COZAAR) 25 MG tablet [id number] No TAKE 1 TABLET BY MOUTH AT BEDTIME [name], MD Taking Active   metFORMIN (GLUCOPHAGE) 1000 MG tablet [id number] No Take 1 tablet by mouth twice daily [name], MD Taking Active   naproxen sodium (ANAPROX) 220 MG tablet [id number] No Take 220 mg by mouth 2 (two) times daily with a meal. [provider] Taking Active            Med Note [name]   Fri Jan 14, 2020 11:25 AM)            Patient Active Problem List   Diagnosis Date Noted  . Hypertriglyceridemia 02/16/2019  . Sciatica of right side associated with disorder of lumbar spine 02/11/2019  . Diabetes mellitus with microalbuminuric diabetic nephropathy ([place]) 02/11/2018  . Solitary pulmonary nodule 05/11/2016  . Nephrolithiasis 02/27/2016  . Numbness and tingling of foot 03/22/2013  . History of tobacco abuse 02/22/2013  . Generalized anxiety disorder 11/23/2012  . Gastritis due to nonsteroidal anti-inflammatory drug 05/06/2012  . NAFLD (nonalcoholic fatty liver disease) 09/22/2011  . Hyperlipidemia associated with type 2 diabetes mellitus ([place])     Conditions to be addressed/monitored: HTN, HLD and DMII  Care Plan : Medication Monitoring  Updates made by De Hollingshead, RPH-CPP since 01/10/2021 12:00 AM    Problem: Diabetes, HTN, HLD     Long-Range Goal: Disease Progression Prevention   Start Date: 10/20/2020  This Visit's Progress: On track  Recent Progress: On track  Priority: High  Note:   Current Barriers:  . Unable to achieve control of diabetes   Pharmacist Clinical Goal(s):  [name] Over the next 90 days, patient will achieve control of diabetes as evidenced by A1c  through collaboration with PharmD and provider.   Interventions: . 1:1 collaboration with [name], MD regarding  development and update of comprehensive plan of care as evidenced by provider attestation and co-signature . Inter-disciplinary care team collaboration (see longitudinal plan of care) . Comprehensive medication review performed; medication list updated in electronic medical record  Diabetes: . Uncontrolled current treatment: metformin 1000 mg BID, Jardiance 25 mg daily, Basaglar 20 units daily, glipizide 2.5 mg with larger suppers o Hx reporting no benefit w/ DPP4, Ozempic  . Current glucose readings:  Date of Download: 5/9-01/07/21 % Time CGM is active: 24% (<70% limits accuracy of interpretation) Average Glucose: 143 mg/dL Glucose Management Indicator: 6.7  Glucose Variability: 30.2 (goal <36%) Time in Goal:  - Time in range 70-180: 80% -  Time above range: 19% - Time below range: 1% Observed patterns: occasional post prandial elevations . Reports hypoglycemic symptoms if he takes glipizide and eats less than he anticipated. Taking with supper. Sometimes lunch is running elevated.  . Reviewed importance of scanning glucose at least Q8H to best utilize CGM technology. He verbalized understanding.  . Continue current regimen at this time.   Hypertension: . Controlled per last office reading;  current treatment: losartan 25 mg QPM  . Previously recommended to continue current regimen at this time  Hyperlipidemia: . Uncontrolled per last lab results; current treatment: atorvastatin 40 mg daily  . Recent lab work has shown worsening of cholesterol control.  . Previously discussed importance of adherence for reduction in long term risk of cardiovascular disease.  . Consider dose increase moving forward if LDL still elevated.   Anxiety: . Uncontrolled; reports use of alprazolam 1 mg PRN, using several times weekly for work-related stress. Endorses this is more frequent than previously.  . Moving forward, consider benefit vs side effects for initiation of option like SSRI/SNRI, or  consideration of mental health support to reduce need for benzodiazepine  Back Pain: . Uncontrolled; reports daily use of naproxen. Denies hx voltaren gel or lidocaine patches . Previously suggested OTC voltaren gel or lidocaine patches to reduce need for daily NSAID therapy given potential renal and GI side effects.   Patient Goals/Self-Care Activities . Over the next 90 days, patient will:  - take medications as prescribed check glucose at least three times daily using CGM, document, and provide at future appointments engage in dietary modifications by reducing carbohydrate contents in lunch meals  Follow Up Plan: Telephone follow up appointment with care management team member scheduled for: ~12 weeks     Medication Assistance:  None required.  Patient affirms current coverage meets needs.  Follow Up:  Patient agrees to Care Plan and Follow-up.  Plan: Telephone follow up appointment with care management team member scheduled for:  ~ 12 weeks  [name], PharmD, [place], [place] Clinical Pharmacist [employer] [facility] [employer] [phone]

## 2021-02-05 ENCOUNTER — Other Ambulatory Visit: Payer: Self-pay | Admitting: Internal Medicine

## 2021-02-12 ENCOUNTER — Other Ambulatory Visit: Payer: Self-pay | Admitting: Internal Medicine

## 2021-03-19 ENCOUNTER — Telehealth: Payer: Self-pay | Admitting: *Deleted

## 2021-03-19 NOTE — Telephone Encounter (Signed)
Please place future orders for lab appt.  

## 2021-03-20 ENCOUNTER — Other Ambulatory Visit (INDEPENDENT_AMBULATORY_CARE_PROVIDER_SITE_OTHER): Payer: BC Managed Care – PPO

## 2021-03-20 ENCOUNTER — Other Ambulatory Visit: Payer: Self-pay

## 2021-03-20 ENCOUNTER — Other Ambulatory Visit: Payer: Self-pay | Admitting: Internal Medicine

## 2021-03-20 DIAGNOSIS — E1169 Type 2 diabetes mellitus with other specified complication: Secondary | ICD-10-CM

## 2021-03-20 DIAGNOSIS — E785 Hyperlipidemia, unspecified: Secondary | ICD-10-CM

## 2021-03-20 LAB — MICROALBUMIN / CREATININE URINE RATIO
Creatinine,U: 62 mg/dL
Microalb Creat Ratio: 11.3 mg/g (ref 0.0–30.0)
Microalb, Ur: 7 mg/dL — ABNORMAL HIGH (ref 0.0–1.9)

## 2021-03-20 LAB — COMPREHENSIVE METABOLIC PANEL
ALT: 27 U/L (ref 0–53)
AST: 22 U/L (ref 0–37)
Albumin: 4.7 g/dL (ref 3.5–5.2)
Alkaline Phosphatase: 49 U/L (ref 39–117)
BUN: 19 mg/dL (ref 6–23)
CO2: 23 mEq/L (ref 19–32)
Calcium: 9.7 mg/dL (ref 8.4–10.5)
Chloride: 102 mEq/L (ref 96–112)
Creatinine, Ser: 0.67 mg/dL (ref 0.40–1.50)
GFR: 112.86 mL/min (ref >60.00)
Glucose, Bld: 143 mg/dL — ABNORMAL HIGH (ref 70–99)
Potassium: 4 mEq/L (ref 3.5–5.1)
Sodium: 137 mEq/L (ref 135–145)
Total Bilirubin: 0.6 mg/dL (ref 0.2–1.2)
Total Protein: 7.1 g/dL (ref 6.0–8.3)

## 2021-03-20 LAB — HEMOGLOBIN A1C: Hgb A1c MFr Bld: 6.8 % — ABNORMAL HIGH (ref 4.6–6.5)

## 2021-03-20 LAB — LIPID PANEL
Cholesterol: 174 mg/dL (ref 0–200)
HDL: 41.5 mg/dL (ref >39.00)
NonHDL: 132.24
Total CHOL/HDL Ratio: 4
Triglycerides: 238 mg/dL — ABNORMAL HIGH (ref 0.0–149.0)
VLDL: 47.6 mg/dL — ABNORMAL HIGH (ref 0.0–40.0)

## 2021-03-20 LAB — LDL CHOLESTEROL, DIRECT: Direct LDL: 98 mg/dL

## 2021-03-20 NOTE — Telephone Encounter (Signed)
Added per secure chat message

## 2021-03-20 NOTE — Telephone Encounter (Signed)
Second request. Pt has lab appt at 8:15 this morning.

## 2021-03-21 ENCOUNTER — Ambulatory Visit: Payer: BC Managed Care – PPO | Admitting: Internal Medicine

## 2021-03-21 ENCOUNTER — Encounter: Payer: Self-pay | Admitting: Internal Medicine

## 2021-03-21 ENCOUNTER — Other Ambulatory Visit: Payer: Self-pay

## 2021-03-21 VITALS — BP 118/70 | HR 78 | Temp 95.6°F | Resp 14 | Ht 70.0 in | Wt 167.4 lb

## 2021-03-21 DIAGNOSIS — Z1211 Encounter for screening for malignant neoplasm of colon: Secondary | ICD-10-CM

## 2021-03-21 DIAGNOSIS — E785 Hyperlipidemia, unspecified: Secondary | ICD-10-CM

## 2021-03-21 DIAGNOSIS — E1121 Type 2 diabetes mellitus with diabetic nephropathy: Secondary | ICD-10-CM | POA: Diagnosis not present

## 2021-03-21 DIAGNOSIS — E1169 Type 2 diabetes mellitus with other specified complication: Secondary | ICD-10-CM | POA: Diagnosis not present

## 2021-03-21 DIAGNOSIS — F411 Generalized anxiety disorder: Secondary | ICD-10-CM | POA: Diagnosis not present

## 2021-03-21 NOTE — Progress Notes (Signed)
Subjective:  Patient ID: Andrew Molina, male    DOB: October 01, 1975  Age: 45 y.o. MRN: [id number]  CC: The primary encounter diagnosis was Hyperlipidemia associated with type 2 diabetes mellitus ([place]). Diagnoses of Diabetes mellitus with microalbuminuric diabetic nephropathy ([place]), Generalized anxiety disorder, and Colon cancer screening were also pertinent to this visit.  HPI Andrew Molina presents for follow up on type 2 DM , hyperlipidemia, and hypertension    He feels generally well, is exercising several times per week and checking blood sugars once daily at variable times.  BS have been under 130 fasting and < 150 post prandially.  Denies any recent hypoglyemic events.  Taking his medications as directed. Following a carbohydrate modified diet 6 days per week. Denies numbness, burning and tingling of extremities. Appetite is good.     Hypertension: patient checks blood pressure once weekly at home.  Readings have been for the most part < 140/80 at rest . Patient is following a reduce dsalt diet most days and is taking medications as prescribed   Outpatient Medications Prior to Visit  Medication Sig Dispense Refill   atorvastatin (LIPITOR) 20 MG tablet Take 1 tablet (20 mg total) by mouth daily. 90 tablet 1   Continuous Blood Gluc Receiver (FREESTYLE LIBRE 2 READER) DEVI Use to check glucose at least 4 times daily 1 each 0   Continuous Blood Gluc Sensor (FREESTYLE LIBRE 2 SENSOR) MISC Use to check glucose at least 4 times daily- Change sensor every 14 days. 2 each 5   glipiZIDE (GLUCOTROL) 5 MG tablet TAKE 1/2 TO 1 (ONE-HALF TO ONE) TABLET BY MOUTH ONCE DAILY BEFORE  SUPPER 90 tablet 1   glucose blood test strip Check sugar twice daily. One Touch Ultra strips. Dx: E11.65 200 each 5   Insulin Glargine (BASAGLAR KWIKPEN) 100 UNIT/ML INJECT 20 UNITS SUBCUTANEOUSLY ONCE DAILY 15 mL 0   Insulin Pen Needle 33G X 6 MM MISC FOR USE WITH BASAGLAR KWIK PEN ONCE DAILY 100 each 1   JARDIANCE 25 MG TABS  tablet Take 1 tablet by mouth once daily 90 tablet 0   losartan (COZAAR) 25 MG tablet TAKE 1 TABLET BY MOUTH AT BEDTIME 90 tablet 0   metFORMIN (GLUCOPHAGE) 1000 MG tablet Take 1 tablet by mouth twice daily 180 tablet 0   naproxen sodium (ANAPROX) 220 MG tablet Take 220 mg by mouth 2 (two) times daily with a meal.     ALPRAZolam (XANAX) 1 MG tablet TAKE 1 TABLET BY MOUTH ONCE DAILY AS NEEDED FOR SLEEP -  LAST  REFILL  WITHOUT  AN  APPT 30 tablet 5   No facility-administered medications prior to visit.    Review of Systems;  Patient denies headache, fevers, malaise, unintentional weight loss, skin rash, eye pain, sinus congestion and sinus pain, sore throat, dysphagia,  hemoptysis , cough, dyspnea, wheezing, chest pain, palpitations, orthopnea, edema, abdominal pain, nausea, melena, diarrhea, constipation, flank pain, dysuria, hematuria, urinary  Frequency, nocturia, numbness, tingling, seizures,  Focal weakness, Loss of consciousness,  Tremor, insomnia, depression, anxiety, and suicidal ideation.      Objective:  BP 118/70 (BP Location: Left Arm, Patient Position: Sitting, Cuff Size: Normal)   Pulse 78   Temp (!) 95.6 F (35.3 C) (Temporal)   Resp 14   Ht 5' 10"  (1.778 m)   Wt 167 lb 6.4 oz (75.9 kg)   SpO2 98%   BMI 24.02 kg/m   BP Readings from Last 3 Encounters:  03/21/21 118/70  09/20/20 122/78  03/14/20 108/64    Wt Readings from Last 3 Encounters:  03/21/21 167 lb 6.4 oz (75.9 kg)  09/20/20 173 lb 3.2 oz (78.6 kg)  03/14/20 171 lb (77.6 kg)    General appearance: alert, cooperative and appears stated age Ears: normal TM's and external ear canals both ears Throat: lips, mucosa, and tongue normal; teeth and gums normal Neck: no adenopathy, no carotid bruit, supple, symmetrical, trachea midline and thyroid not enlarged, symmetric, no tenderness/mass/nodules Back: symmetric, no curvature. ROM normal. No CVA tenderness. Lungs: clear to auscultation bilaterally Heart:  regular rate and rhythm, S1, S2 normal, no murmur, click, rub or gallop Abdomen: soft, non-tender; bowel sounds normal; no masses,  no organomegaly Pulses: 2+ and symmetric Skin: Skin color, texture, turgor normal. No rashes or lesions Lymph nodes: Cervical, supraclavicular, and axillary nodes normal.  Lab Results  Component Value Date   HGBA1C 6.8 (H) 03/20/2021   HGBA1C 7.2 (A) 09/20/2020   HGBA1C 7.2 (H) 03/09/2020    Lab Results  Component Value Date   CREATININE 0.67 03/20/2021   CREATININE 0.68 09/20/2020   CREATININE 0.65 03/09/2020    Lab Results  Component Value Date   WBC 6.6 02/27/2016   HGB 13.4 02/27/2016   HCT 39.1 02/27/2016   PLT 250.0 02/27/2016   GLUCOSE 143 (H) 03/20/2021   CHOL 174 03/20/2021   TRIG 238.0 (H) 03/20/2021   HDL 41.50 03/20/2021   LDLDIRECT 98.0 03/20/2021   LDLCALC 85 11/22/2019   ALT 27 03/20/2021   AST 22 03/20/2021   NA 137 03/20/2021   K 4.0 03/20/2021   CL 102 03/20/2021   CREATININE 0.67 03/20/2021   BUN 19 03/20/2021   CO2 23 03/20/2021   TSH 1.92 08/14/2016   HGBA1C 6.8 (H) 03/20/2021   MICROALBUR 7.0 (H) 03/20/2021    DG Facial Bones 1-2 Views  Result Date: 02/18/2019 CLINICAL DATA:  Foreign body post injury 1.5 months ago. EXAM: FACIAL BONES - 1-2 VIEW COMPARISON:  None. FINDINGS: There is no evidence of fracture or other significant bone abnormality. No orbital emphysema or sinus air-fluid levels are seen. There is a 6 mm metallic fragment within the soft tissues overlying the left zygomatic arch. IMPRESSION: 6 mm metallic foreign body within the soft tissues overlying the left zygomatic arch. Electronically Signed   By: Fidela Salisbury M.D.   On: 02/18/2019 14:33    Assessment & Plan:   Problem List Items Addressed This Visit       Unprioritized   Colon cancer screening    Advised to check with  insurance about the cologuard test as a covered screening test for colon cancer.       Diabetes mellitus with  microalbuminuric diabetic nephropathy (H[facility])    Stable control with marked improvement once the CBG monitor was made available.  Continue metformin,  glipizide, jardiance, and  Basalar for glycemic control,  ARB for nephropathy , and statin  Lab Results  Component Value Date   HGBA1C 6.8 (H) 03/20/2021   Lab Results  Component Value Date   MICROALBUR 7.0 (H) 03/20/2021   MICROALBUR 16.6 (H) 08/20/2019           Generalized anxiety disorder    Managed with rare use of xanax. Did not tolerate Effexor trial. He has had to double the dose when used. The risks and benefits of benzodiazepine use were discussed with patient today including excessive sedation leading to respiratory depression,  impaired thinking/driving, and addiction.  Patient was  advised to avoid concurrent use with alcohol, to use medication only as needed and not to share with others  .       Relevant Medications   ALPRAZolam (XANAX) 1 MG tablet   Hyperlipidemia associated with type 2 diabetes mellitus ([place]) - Primary   Relevant Orders   Hemoglobin A1c   Comprehensive metabolic panel   Lipid panel   Microalbumin / creatinine urine ratio    I have changed Andrew Molina's ALPRAZolam. I am also having him maintain his naproxen sodium, glucose blood, Insulin Pen Needle, atorvastatin, FreeStyle Libre 2 Reader, [employer] 2 Sensor, glipiZIDE, Jardiance, Basaglar KwikPen, metFORMIN, and losartan.  Meds ordered this encounter  Medications   ALPRAZolam (XANAX) 1 MG tablet    Sig: TAKE 1 TABLET BY MOUTH ONCE DAILY AS NEEDED FOR SLEEP -    Dispense:  30 tablet    Refill:  5    Medications Discontinued During This Encounter  Medication Reason   ALPRAZolam (XANAX) 1 MG tablet Reorder    Follow-up: Return in about 6 months (around 09/21/2021) for follow up diabetes.   [name], MD

## 2021-03-21 NOTE — Patient Instructions (Addendum)
  It's time to start colon cancer screening  (24 is the new recommended age for screening)  Check with your insurance about the cologuard test as a covered screening test for colon cancer.  If it is not,  I will make a referral for colonoscopy    Your diabetes remains under excellent control  And your cholesterol and other labs are also normal. Please continue your current medications. return in 6 months for follow up on diabetes and make sure you sschedule your diabetic eye exam ;  This is recommended  once a year for a dilated retina exam to monitor for diabetic retinopathy,. changes that can lead to blindness .

## 2021-03-25 DIAGNOSIS — Z1211 Encounter for screening for malignant neoplasm of colon: Secondary | ICD-10-CM | POA: Insufficient documentation

## 2021-03-25 MED ORDER — ALPRAZOLAM 1 MG PO TABS: ORAL_TABLET | ORAL | 5 refills | Status: DC

## 2021-03-25 NOTE — Assessment & Plan Note (Signed)
Advised to check with  insurance about the cologuard test as a covered screening test for colon cancer.

## 2021-03-25 NOTE — Assessment & Plan Note (Signed)
Managed with rare use of xanax. Did not tolerate Effexor trial. He has had to double the dose when used. The risks and benefits of benzodiazepine use were discussed with patient today including excessive sedation leading to respiratory depression,  impaired thinking/driving, and addiction.  Patient was advised to avoid concurrent use with alcohol, to use medication only as needed and not to share with others  .

## 2021-03-25 NOTE — Assessment & Plan Note (Signed)
Stable control with marked improvement once the CBG monitor was made available.  Continue metformin,  glipizide, jardiance, and  Basalar for glycemic control,  ARB for nephropathy , and statin  Lab Results  Component Value Date   HGBA1C 6.8 (H) 03/20/2021   Lab Results  Component Value Date   MICROALBUR 7.0 (H) 03/20/2021   MICROALBUR 16.6 (H) 08/20/2019

## 2021-03-28 DIAGNOSIS — E119 Type 2 diabetes mellitus without complications: Secondary | ICD-10-CM | POA: Diagnosis not present

## 2021-03-28 LAB — HM DIABETES EYE EXAM

## 2021-03-29 ENCOUNTER — Other Ambulatory Visit: Payer: Self-pay | Admitting: Internal Medicine

## 2021-04-10 ENCOUNTER — Telehealth: Payer: Self-pay | Admitting: Pharmacist

## 2021-04-10 ENCOUNTER — Other Ambulatory Visit: Payer: Self-pay | Admitting: Internal Medicine

## 2021-04-10 NOTE — Telephone Encounter (Signed)
Patient is returning your call from earlier.

## 2021-04-10 NOTE — Telephone Encounter (Signed)
Patient not interested in going back on DexCom. Closing out PA requests for [facility] sensor and transmitter via Cover My Meds

## 2021-04-10 NOTE — Telephone Encounter (Signed)
  Chronic Care Management   Note  04/10/2021 Name: Andrew Molina MRN: [id number] DOB: 06-11-1976   Attempted to contact patient to discuss PA requested we received for DexCom G6 sensor and transmitter. LVM for him to return my call at his convenience.

## 2021-04-17 ENCOUNTER — Ambulatory Visit: Payer: BC Managed Care – PPO | Admitting: Pharmacist

## 2021-04-17 ENCOUNTER — Telehealth: Payer: Self-pay | Admitting: Pharmacist

## 2021-04-17 DIAGNOSIS — E1169 Type 2 diabetes mellitus with other specified complication: Secondary | ICD-10-CM

## 2021-04-17 DIAGNOSIS — E785 Hyperlipidemia, unspecified: Secondary | ICD-10-CM

## 2021-04-17 DIAGNOSIS — E1121 Type 2 diabetes mellitus with diabetic nephropathy: Secondary | ICD-10-CM

## 2021-04-17 MED ORDER — TIRZEPATIDE 2.5 MG/0.5ML ~~LOC~~ SOAJ: 2.5000 mg | SUBCUTANEOUS | 0 refills | Status: DC

## 2021-04-17 MED ORDER — BASAGLAR KWIKPEN 100 UNIT/ML ~~LOC~~ SOPN: 12.0000 [IU] | PEN_INJECTOR | Freq: Every day | SUBCUTANEOUS | 3 refills | Status: DC

## 2021-04-17 NOTE — Chronic Care Management (AMB) (Signed)
Care Management   Pharmacy Note  04/17/2021 Name: Andrew Molina MRN: [id number] DOB: 1976/07/22  Subjective: Andrew Molina is a 45 y.o. year old male who is a primary care patient of [name], MD. The Care Management team was consulted for assistance with care management and care coordination needs.    Engaged with patient by telephone for follow up visit in response to provider referral for pharmacy case management and/or care coordination services.   The patient was given information about Care Management services today including:  Care Management services includes personalized support from designated clinical staff supervised by the patient's primary care provider, including individualized plan of care and coordination with other care providers. 24/7 contact phone numbers for assistance for urgent and routine care needs. The patient may stop case management services at any time by phone call to the office staff.  Patient agreed to services and consent obtained.  Assessment:  Review of patient status, including review of consultants reports, laboratory and other test data, was performed as part of comprehensive evaluation and provision of chronic care management services.   SDOH (Social Determinants of Health) assessments and interventions performed:  SDOH Interventions    Flowsheet Row Most Recent Value  SDOH Interventions   Financial Strain Interventions Intervention Not Indicated        Objective:  Lab Results  Component Value Date   CREATININE 0.67 03/20/2021   CREATININE 0.68 09/20/2020   CREATININE 0.65 03/09/2020    Lab Results  Component Value Date   HGBA1C 6.8 (H) 03/20/2021       Component Value Date/Time   CHOL 174 03/20/2021 0807   TRIG 238.0 (H) 03/20/2021 0807   HDL 41.50 03/20/2021 0807   CHOLHDL 4 03/20/2021 0807   VLDL 47.6 (H) 03/20/2021 0807   LDLCALC 85 11/22/2019 0819   LDLDIRECT 98.0 03/20/2021 0807    Clinical ASCVD: No  The 10-year  ASCVD risk score (Arnett DK, et al., 2019) is: 3.9%   Values used to calculate the score:     Age: 22 years     Sex: Male     Is Non-Hispanic African American: No     Diabetic: Yes     Tobacco smoker: No     Systolic Blood Pressure: 767 mmHg     Is BP treated: Yes     HDL Cholesterol: 41.5 mg/dL     Total Cholesterol: 174 mg/dL     BP Readings from Last 3 Encounters:  03/21/21 118/70  09/20/20 122/78  03/14/20 108/64    Care Plan  Allergies  Allergen Reactions   Prednisone     Jittery.  Insomnia.   Venlafaxine Anxiety    Increased irritability    Medications Reviewed Today     Reviewed by De Hollingshead, RPH-CPP (Pharmacist) on 04/17/21 at 1149  Med List Status: <None>   Medication Order Taking? Sig Documenting Provider Last Dose Status Informant  ALPRAZolam (XANAX) 1 MG tablet [id number] Yes TAKE 1 TABLET BY MOUTH ONCE DAILY AS NEEDED FOR SLEEP - [name], MD Taking Active   atorvastatin (LIPITOR) 20 MG tablet [id number] Yes Take 1 tablet by mouth once daily [name], MD Taking Active   Continuous Blood Gluc Receiver (FREESTYLE LIBRE 2 READER) DEVI [id number] Yes Use to check glucose at least 4 times daily [name], MD Taking Active   Continuous Blood Gluc Sensor (FREESTYLE LIBRE 2 SENSOR) [place] [id number] Yes Use to check glucose at least 4 times daily-  Change sensor every 14 days. [name], MD Taking Active   glipiZIDE (GLUCOTROL) 5 MG tablet [id number] Yes TAKE 1/2 TO 1 (ONE-HALF TO ONE) TABLET BY MOUTH ONCE DAILY BEFORE  SUPPER [name], MD Taking Active   glucose blood test strip [id number] Yes Check sugar twice daily. One Touch Ultra strips. Dx: E11.65 [name], MD Taking Active   Insulin Glargine [facility]) 100 UNIT/ML [id number] Yes INJECT 20 UNITS SUBCUTANEOUSLY ONCE DAILY [name], MD Taking Active   Insulin Pen Needle 33G X 6 MM MISC [id number] Yes FOR USE WITH [place] KWIK PEN ONCE DAILY [name], MD  Taking Active   JARDIANCE 25 MG TABS tablet [id number] Yes Take 1 tablet by mouth once daily [name], MD Taking Active   losartan (COZAAR) 25 MG tablet [id number] Yes TAKE 1 TABLET BY MOUTH AT BEDTIME [name], MD Taking Active   metFORMIN (GLUCOPHAGE) 1000 MG tablet [id number] Yes Take 1 tablet by mouth twice daily [name], MD Taking Active   naproxen sodium (ANAPROX) 220 MG tablet [id number] Yes Take 220 mg by mouth 2 (two) times daily with a meal. [provider] Taking Active            Med Note [name]   Fri Jan 14, 2020 11:25 AM)              Patient Active Problem List   Diagnosis Date Noted   Colon cancer screening 03/25/2021   Hypertriglyceridemia 02/16/2019   Sciatica of right side associated with disorder of lumbar spine 02/11/2019   Diabetes mellitus with microalbuminuric diabetic nephropathy ([place]) 02/11/2018   Solitary pulmonary nodule 05/11/2016   Nephrolithiasis 02/27/2016   Numbness and tingling of foot 03/22/2013   History of tobacco abuse 02/22/2013   Generalized anxiety disorder 11/23/2012   Gastritis due to nonsteroidal anti-inflammatory drug 05/06/2012   NAFLD (nonalcoholic fatty liver disease) 09/22/2011   Hyperlipidemia associated with type 2 diabetes mellitus ([place])     Conditions to be addressed/monitored: HTN, HLD, and DMII  Care Plan : Medication Monitoring  Updates made by De Hollingshead, RPH-CPP since 04/17/2021 12:00 AM     Problem: Diabetes, HTN, HLD      Long-Range Goal: Disease Progression Prevention   Start Date: 10/20/2020  This Visit's Progress: On track  Recent Progress: On track  Priority: High  Note:   Current Barriers:  Unable to achieve control of diabetes   Pharmacist Clinical Goal(s):  Over the next 90 days, patient will achieve control of diabetes as evidenced by A1c  through collaboration with PharmD and provider.   Interventions: 1:1 collaboration with [name], MD  regarding development and update of comprehensive plan of care as evidenced by provider attestation and co-signature Inter-disciplinary care team collaboration (see longitudinal plan of care) Comprehensive medication review performed; medication list updated in electronic medical record  Diabetes: Controlled per last A1c; current treatment: metformin 1000 mg BID, Jardiance 25 mg daily, Basaglar 20 units daily, glipizide 2.5-5 mg with larger suppers Hx reporting no benefit w/ DPP4, Ozempic - though today notes GI upset Current glucose readings:  Date of Download: 8/30-9/12/22 % Time CGM is active: 34% (<70% limits accuracy of interpretation) Average Glucose: 179 mg/dL Glucose Management Indicator: not enough data Glucose Variability: 29.8 (goal <36%) Time in Goal:  - Time in range 70-180: 55% - Time above range: 43% - Time below range: 2% Observed patterns: lots of post prandial elevations Discussed  opportunities to optimize regimen and consolidate options. Discussed trial of Mounjaro. Given potency, if tolerated, would allow for discontinuation of glipizide, reduction in Basaglar and possible discontinuation. Discussed potential for GI upset. Patient amenable to try. Start Mounjaro 2.5 mg weekly. Providing sample of starter dose to determine tolerability. Stop glipizide, reduce Basaglar to 12 units daily.    Hypertension: Controlled per last office reading;  current treatment: losartan 25 mg QPM  Previously recommended to continue current regimen at this time  Hyperlipidemia: Uncontrolled per last lab results; current treatment: atorvastatin 20 mg daily  Recommend goal LDL <70 given diabetes and risk factor of hypertension. Recommend to increase atorvastatin to 40 mg daily. Will discuss with patient moving forward, but will defer two medication changes at once.   Anxiety: Uncontrolled; reports use of alprazolam 1 mg PRN, using several times weekly for work-related stress.  Moving  forward, consider benefit vs side effects for initiation of option like SSRI/SNRI, or consideration of mental health support to reduce need for benzodiazepine  Back Pain: Uncontrolled; reports daily use of naproxen. Denies hx voltaren gel or lidocaine patches Previously suggested OTC voltaren gel or lidocaine patches to reduce need for daily NSAID therapy given potential renal and GI side effects.   Patient Goals/Self-Care Activities Over the next 90 days, patient will:  - take medications as prescribed check glucose at least three times daily using CGM, document, and provide at future appointments engage in dietary modifications by reducing carbohydrate contents in lunch meals  Follow Up Plan: Telephone follow up appointment with care management team member scheduled for: ~ 4 weeks     Medication Assistance:  None required.  Patient affirms current coverage meets needs.  Follow Up:  Patient agrees to Care Plan and Follow-up.  Plan: Telephone follow up appointment with care management team member scheduled for:  ~ 4 weeks  [name], PharmD, [place], [place] Clinical Pharmacist [employer] [facility] [employer] [phone]

## 2021-04-17 NOTE — Patient Instructions (Signed)
Marguis,   Stop glipizide. Reduce Basaglar to 12 units daily. Start Mounjaro 2.5 mg weekly. This medication may cause stomach upset, queasiness, or constipation, especially when first starting. This generally improves over time. Call our office if these symptoms occur and worsen, or if you have severe symptoms such as vomiting, diarrhea, or stomach pain.   [name], PharmD [phone]   Visit Information   Goals Addressed               This Visit's Progress     Patient Stated     Medication Monitoring (pt-stated)        Patient Goals/Self-Care Activities Over the next 90 days, patient will:  - take medications as prescribed check glucose at least three times daily using CGM, document, and provide at future appointments engage in dietary modifications by reducing carbohydrate contents in lunch meals        Print copy of patient instructions, educational materials, and care plan provided in person.   Plan: Telephone follow up appointment with care management team member scheduled for:  ~ 4 weeks  [name], PharmD, [place], [place] Clinical Pharmacist [employer] [facility] [employer] [phone]

## 2021-04-17 NOTE — Telephone Encounter (Signed)
**Note Andrew-Identified via Obfuscation** Medication Samples have been labeled and logged for the patient. He plans to come pick up tomorrow during lunch  Drug name: Darcel Bayley       Strength: 2.5 mg       Qty: 1 box  LOT: [id number]  Exp.Date: 09/10/2022  Dosing instructions: Inject 2.5 mg weekly  The patient has been instructed regarding the correct time, dose, and frequency of taking this medication, including desired effects and most common side effects.   Andrew Molina 12:39 PM 04/17/2021

## 2021-04-18 NOTE — Telephone Encounter (Signed)
Medication has been picked up by pt.

## 2021-04-19 ENCOUNTER — Other Ambulatory Visit: Payer: Self-pay | Admitting: Internal Medicine

## 2021-04-19 DIAGNOSIS — E1121 Type 2 diabetes mellitus with diabetic nephropathy: Secondary | ICD-10-CM

## 2021-05-14 ENCOUNTER — Ambulatory Visit: Payer: BC Managed Care – PPO | Admitting: Pharmacist

## 2021-05-14 DIAGNOSIS — E1121 Type 2 diabetes mellitus with diabetic nephropathy: Secondary | ICD-10-CM

## 2021-05-14 MED ORDER — TIRZEPATIDE 2.5 MG/0.5ML ~~LOC~~ SOAJ: 2.5000 mg | SUBCUTANEOUS | 2 refills | Status: DC

## 2021-05-14 NOTE — Patient Instructions (Signed)
Visit Information   Goals Addressed               This Visit's Progress     Patient Stated     Medication Monitoring (pt-stated)        Patient Goals/Self-Care Activities Over the next 90 days, patient will:  - take medications as prescribed check glucose at least three times daily using CGM, document, and provide at future appointments engage in dietary modifications by reducing carbohydrate contents in lunch meals         Patient verbalizes understanding of instructions provided today and agrees to view in [place].    Plan: Telephone follow up appointment with care management team member scheduled for:  ~ 4 weeks  [name], PharmD, [place], [place] Clinical Pharmacist [employer] [facility] [employer] [phone]

## 2021-05-14 NOTE — Chronic Care Management (AMB) (Signed)
**Note Andrew-Identified via Obfuscation** Chronic Care Management Pharmacy Note  05/14/2021 Name:  SHAWAN Molina MRN:  [id number] DOB:  Oct 06, 1975  Subjective: Andrew Molina is an 45 y.o. year old male who is a primary patient of Andrew Molina, Andrew Everts, MD.  The CCM team was consulted for assistance with disease management and care coordination needs.    Engaged with patient by telephone for follow up visit in response to provider referral for pharmacy case management and/or care coordination services.   Consent to Services:  The patient was given information about Chronic Care Management services, agreed to services, and gave verbal consent prior to initiation of services.  Please see initial visit note for detailed documentation.   Patient Care Team: Andrew Mc, MD as PCP - General (Internal Medicine) Andrew Molina, RPH-CPP as Pharmacist (Pharmacist)   Objective:  Lab Results  Component Value Date   CREATININE 0.67 03/20/2021   CREATININE 0.68 09/20/2020   CREATININE 0.65 03/09/2020    Lab Results  Component Value Date   HGBA1C 6.8 (H) 03/20/2021   Last diabetic Eye exam:  Lab Results  Component Value Date/Time   HMDIABEYEEXA No Retinopathy 03/28/2021 12:00 AM    Last diabetic Foot exam:  Lab Results  Component Value Date/Time   HMDIABFOOTEX normal 11/11/2014 12:00 AM        Component Value Date/Time   CHOL 174 03/20/2021 0807   TRIG 238.0 (H) 03/20/2021 0807   HDL 41.50 03/20/2021 0807   CHOLHDL 4 03/20/2021 0807   VLDL 47.6 (H) 03/20/2021 0807   LDLCALC 85 11/22/2019 0819   LDLDIRECT 98.0 03/20/2021 0807    Hepatic Function Latest Ref Rng & Units 03/20/2021 09/20/2020 03/09/2020  Total Protein 6.0 - 8.3 g/dL 7.1 7.2 7.1  Albumin 3.5 - 5.2 g/dL 4.7 4.8 4.7  AST 0 - 37 U/L 22 20 23   ALT 0 - 53 U/L 27 33 32  Alk Phosphatase 39 - 117 U/L 49 55 53  Total Bilirubin 0.2 - 1.2 mg/dL 0.6 0.5 0.5    Lab Results  Component Value Date/Time   TSH 1.92 08/14/2016 11:52 AM   TSH 2.44 02/27/2015 12:27 PM    FREET4 CANCELED 03/22/2013 08:35 AM    CBC Latest Ref Rng & Units 02/27/2016 06/18/2011  WBC 4.0 - 10.5 K/uL 6.6 4.2(L)  Hemoglobin 13.0 - 17.0 g/dL 13.4 14.6  Hematocrit 39.0 - 52.0 % 39.1 42.1  Platelets 150.0 - 400.0 K/uL 250.0 194.0     Social History   Tobacco Use  Smoking Status Former   Types: E-cigarettes   Quit date: 06/17/2000   Years since quitting: 20.9  Smokeless Tobacco Former   Types: Snuff   Quit date: 01/04/2013   BP Readings from Last 3 Encounters:  03/21/21 118/70  09/20/20 122/78  03/14/20 108/64   Pulse Readings from Last 3 Encounters:  03/21/21 78  09/20/20 76  03/14/20 84   Wt Readings from Last 3 Encounters:  03/21/21 167 lb 6.4 oz (75.9 kg)  09/20/20 173 lb 3.2 oz (78.6 kg)  03/14/20 171 lb (77.6 kg)    Assessment: Review of patient past medical history, allergies, medications, health status, including review of consultants reports, laboratory and other test data, was performed as part of comprehensive evaluation and provision of chronic care management services.   SDOH:  (Social Determinants of Health) assessments and interventions performed:    CCM Care Plan  Allergies  Allergen Reactions   Prednisone     Jittery.  Insomnia.  Venlafaxine Anxiety    Increased irritability    Medications Reviewed Today     Reviewed by Andrew Molina, RPH-CPP (Pharmacist) on 04/17/21 at 1149  Med List Status: <None>   Medication Order Taking? Sig Documenting Provider Last Dose Status Informant  ALPRAZolam (XANAX) 1 MG tablet [id number] Yes TAKE 1 TABLET BY MOUTH ONCE DAILY AS NEEDED FOR SLEEP - Andrew Mc, MD Taking Active   atorvastatin (LIPITOR) 20 MG tablet [id number] Yes Take 1 tablet by mouth once daily Andrew Mc, MD Taking Active   Continuous Blood Gluc Receiver (FREESTYLE LIBRE 2 READER) DEVI [id number] Yes Use to check glucose at least 4 times daily Andrew Mc, MD Taking Active   Continuous Blood Gluc Sensor (FREESTYLE  LIBRE 2 SENSOR) [place] [id number] Yes Use to check glucose at least 4 times daily- Change sensor every 14 days. Andrew Mc, MD Taking Active   glipiZIDE (GLUCOTROL) 5 MG tablet [id number] Yes TAKE 1/2 TO 1 (ONE-HALF TO ONE) TABLET BY MOUTH ONCE DAILY BEFORE  SUPPER Andrew Mc, MD Taking Active   glucose blood test strip [id number] Yes Check sugar twice daily. One Touch Ultra strips. Dx: E11.65 Andrew Mc, MD Taking Active   Insulin Glargine [facility]) 100 UNIT/ML [id number] Yes INJECT 20 UNITS SUBCUTANEOUSLY ONCE DAILY Andrew Mc, MD Taking Active   Insulin Pen Needle 33G X 6 MM MISC [id number] Yes FOR USE WITH [place] KWIK PEN ONCE DAILY Andrew Mc, MD Taking Active   JARDIANCE 25 MG TABS tablet [id number] Yes Take 1 tablet by mouth once daily Andrew Mc, MD Taking Active   losartan (COZAAR) 25 MG tablet [id number] Yes TAKE 1 TABLET BY MOUTH AT BEDTIME Andrew Mc, MD Taking Active   metFORMIN (GLUCOPHAGE) 1000 MG tablet [id number] Yes Take 1 tablet by mouth twice daily Andrew Mc, MD Taking Active   naproxen sodium (ANAPROX) 220 MG tablet [id number] Yes Take 220 mg by mouth 2 (two) times daily with a meal. [provider] Taking Active            Med Note [name]   Fri Jan 14, 2020 11:25 AM)              Patient Active Problem List   Diagnosis Date Noted   Colon cancer screening 03/25/2021   Hypertriglyceridemia 02/16/2019   Sciatica of right side associated with disorder of lumbar spine 02/11/2019   Diabetes mellitus with microalbuminuric diabetic nephropathy ([facility]) 02/11/2018   Solitary pulmonary nodule 05/11/2016   Nephrolithiasis 02/27/2016   Numbness and tingling of foot 03/22/2013   History of tobacco abuse 02/22/2013   Generalized anxiety disorder 11/23/2012   Gastritis due to nonsteroidal anti-inflammatory drug 05/06/2012   NAFLD (nonalcoholic fatty liver disease) 09/22/2011   Hyperlipidemia associated with type 2  diabetes mellitus (Andrew Molina)     Immunization History  Administered Date(s) Administered   PFIZER(Purple Top)SARS-COV-2 Vaccination 05/02/2020, 05/23/2020   Pneumococcal Polysaccharide-23 02/27/2015   Tdap 05/27/2014    Conditions to be addressed/monitored: HTN, HLD, and DMII  Care Plan : Medication Monitoring  Updates made by Andrew Molina, RPH-CPP since 05/14/2021 12:00 AM     Problem: Diabetes, HTN, HLD      Long-Range Goal: Disease Progression Prevention   Start Date: 10/20/2020  Recent Progress: On track  Priority: High  Note:   Current Barriers:  Unable to achieve control of diabetes   Pharmacist Clinical Goal(s):  Over the next 90 days, patient will  achieve control of diabetes as evidenced by A1c  through collaboration with PharmD and provider.   Interventions: 1:1 collaboration with Andrew Mc, MD regarding development and update of comprehensive plan of care as evidenced by provider attestation and co-signature Inter-disciplinary care team collaboration (see longitudinal plan of care) Comprehensive medication review performed; medication list updated in electronic medical record  Diabetes: Controlled per last A1c; current treatment: metformin 1000 mg BID, Jardiance 25 mg daily, Basaglar 12 units daily, Mounjaro 2.5 mg weekly  Reports some GI upset, significantly reduced appetite since starting Mounjaro. Denies severe nausea, vomiting.  Hx reporting no benefit w/ DPP4, Ozempic - though today notes GI upset Current glucose readings:  Date of Download: 9/27-10/10/22 % Time CGM is active: 56% (<70% limits accuracy of interpretation) Average Glucose: 130 mg/dL Glucose Management Indicator: not enough data Glucose Variability: 25.1 (goal <36%) Time in Goal:  - Time in range 70-180: 93% - Time above range: 7% - Time below range: 0% Discussed option to increase Mounjaro to 5 mg weekly and discontinue Basaglar. Patient elects to continue current regimen for  another 4 weeks given some queasiness at this point. Will re-assess in 4 weeks.   Hypertension: Controlled per last office reading;  current treatment: losartan 25 mg QPM  Previously recommended to continue current regimen at this time  Hyperlipidemia: Uncontrolled per last lab results; current treatment: atorvastatin 20 mg daily  Recommend goal LDL <70 given diabetes and risk factor of hypertension. Recommend to increase atorvastatin to 40 mg daily. Will discuss with patient moving forward, but will defer two medication changes at once.   Anxiety: Uncontrolled; reports use of alprazolam 1 mg PRN, using several times weekly for work-related stress.  Moving forward, consider benefit vs side effects for initiation of option like SSRI/SNRI, or consideration of mental health support to reduce need for benzodiazepine  Back Pain: Uncontrolled; reports daily use of naproxen. Denies hx voltaren gel or lidocaine patches Previously suggested OTC voltaren gel or lidocaine patches to reduce need for daily NSAID therapy given potential renal and GI side effects.   Patient Goals/Self-Care Activities Over the next 90 days, patient will:  - take medications as prescribed check glucose at least three times daily using CGM, document, and provide at future appointments engage in dietary modifications by reducing carbohydrate contents in lunch meals  Follow Up Plan: Telephone follow up appointment with care management team member scheduled for: ~ 4 weeks     Medication Assistance: None required.  Patient affirms current coverage meets needs.  Patient's preferred pharmacy is:  [facility] [address], [place] - [address] [place] [address] Phone: [phone] Fax: [phone]  Follow Up:  Patient agrees to Care Plan and Follow-up.  Plan: Telephone follow up appointment with care management team member scheduled for:  ~ 4 weeks  [name], PharmD, [place],  [place] Clinical Pharmacist [employer] [facility] [employer] [phone]

## 2021-05-20 ENCOUNTER — Other Ambulatory Visit: Payer: Self-pay | Admitting: Internal Medicine

## 2021-05-21 ENCOUNTER — Ambulatory Visit: Payer: BC Managed Care – PPO | Admitting: Pharmacist

## 2021-05-21 DIAGNOSIS — E785 Hyperlipidemia, unspecified: Secondary | ICD-10-CM

## 2021-05-21 DIAGNOSIS — E1121 Type 2 diabetes mellitus with diabetic nephropathy: Secondary | ICD-10-CM

## 2021-05-21 DIAGNOSIS — E1169 Type 2 diabetes mellitus with other specified complication: Secondary | ICD-10-CM

## 2021-05-21 NOTE — Chronic Care Management (AMB) (Signed)
  Chronic Care Management Pharmacy Note  05/21/2021 Name:  Andrew Molina MRN:  [id number] DOB:  11/19/1975  Subjective: Andrew Molina is an 45 y.o. year old male who is a primary patient of [name], Teresa L, MD.  The CCM team was consulted for assistance with disease management and care coordination needs.    Engaged with patient by telephone for  medication access  in response to provider referral for pharmacy case management and/or care coordination services.   Consent to Services:  The patient was given information about Chronic Care Management services, agreed to services, and gave verbal consent prior to initiation of services.  Please see initial visit note for detailed documentation.   Patient Care Team: [name], Teresa L, MD as PCP - General (Internal Medicine) Travis, Catherine E, RPH-CPP as Pharmacist (Pharmacist)   Objective:  Lab Results  Component Value Date   CREATININE 0.67 03/20/2021   CREATININE 0.68 09/20/2020   CREATININE 0.65 03/09/2020    Lab Results  Component Value Date   HGBA1C 6.8 (H) 03/20/2021   Last diabetic Eye exam:  Lab Results  Component Value Date/Time   HMDIABEYEEXA No Retinopathy 03/28/2021 12:00 AM    Last diabetic Foot exam:  Lab Results  Component Value Date/Time   HMDIABFOOTEX normal 11/11/2014 12:00 AM        Component Value Date/Time   CHOL 174 03/20/2021 0807   TRIG 238.0 (H) 03/20/2021 0807   HDL 41.50 03/20/2021 0807   CHOLHDL 4 03/20/2021 0807   VLDL 47.6 (H) 03/20/2021 0807   LDLCALC 85 11/22/2019 0819   LDLDIRECT 98.0 03/20/2021 0807    Hepatic Function Latest Ref Rng & Units 03/20/2021 09/20/2020 03/09/2020  Total Protein 6.0 - 8.3 g/dL 7.1 7.2 7.1  Albumin 3.5 - 5.2 g/dL 4.7 4.8 4.7  AST 0 - 37 U/L 22 20 23  ALT 0 - 53 U/L 27 33 32  Alk Phosphatase 39 - 117 U/L 49 55 53  Total Bilirubin 0.2 - 1.2 mg/dL 0.6 0.5 0.5    Lab Results  Component Value Date/Time   TSH 1.92 08/14/2016 11:52 AM   TSH 2.44 02/27/2015  12:27 PM   FREET4 CANCELED 03/22/2013 08:35 AM    CBC Latest Ref Rng & Units 02/27/2016 06/18/2011  WBC 4.0 - 10.5 K/uL 6.6 4.2(L)  Hemoglobin 13.0 - 17.0 g/dL 13.4 14.6  Hematocrit 39.0 - 52.0 % 39.1 42.1  Platelets 150.0 - 400.0 K/uL 250.0 194.0    Social History   Tobacco Use  Smoking Status Former   Types: E-cigarettes   Quit date: 06/17/2000   Years since quitting: 20.9  Smokeless Tobacco Former   Types: Snuff   Quit date: 01/04/2013   BP Readings from Last 3 Encounters:  03/21/21 118/70  09/20/20 122/78  03/14/20 108/64   Pulse Readings from Last 3 Encounters:  03/21/21 78  09/20/20 76  03/14/20 84   Wt Readings from Last 3 Encounters:  03/21/21 167 lb 6.4 oz (75.9 kg)  09/20/20 173 lb 3.2 oz (78.6 kg)  03/14/20 171 lb (77.6 kg)    Assessment: Review of patient past medical history, allergies, medications, health status, including review of consultants reports, laboratory and other test data, was performed as part of comprehensive evaluation and provision of chronic care management services.   SDOH:  (Social Determinants of Health) assessments and interventions performed:    CCM Care Plan  Allergies  Allergen Reactions   Prednisone     Jittery.  Insomnia.     Venlafaxine Anxiety    Increased irritability    Medications Reviewed Today     Reviewed by De Hollingshead, RPH-CPP (Pharmacist) on 04/17/21 at 1149  Med List Status: <None>   Medication Order Taking? Sig Documenting Provider Last Dose Status Informant  ALPRAZolam (XANAX) 1 MG tablet [id number] Yes TAKE 1 TABLET BY MOUTH ONCE DAILY AS NEEDED FOR SLEEP - [name], MD Taking Active   atorvastatin (LIPITOR) 20 MG tablet [id number] Yes Take 1 tablet by mouth once daily [name], MD Taking Active   Continuous Blood Gluc Receiver (FREESTYLE LIBRE 2 READER) DEVI [id number] Yes Use to check glucose at least 4 times daily [name], MD Taking Active   Continuous Blood Gluc Sensor  (FREESTYLE LIBRE 2 SENSOR) [place] [id number] Yes Use to check glucose at least 4 times daily- Change sensor every 14 days. [name], MD Taking Active   glipiZIDE (GLUCOTROL) 5 MG tablet [id number] Yes TAKE 1/2 TO 1 (ONE-HALF TO ONE) TABLET BY MOUTH ONCE DAILY BEFORE  SUPPER [name], MD Taking Active   glucose blood test strip [id number] Yes Check sugar twice daily. One Touch Ultra strips. Dx: E11.65 [name], MD Taking Active   Insulin Glargine [facility]) 100 UNIT/ML [id number] Yes INJECT 20 UNITS SUBCUTANEOUSLY ONCE DAILY [name], MD Taking Active   Insulin Pen Needle 33G X 6 MM MISC [id number] Yes FOR USE WITH [place] KWIK PEN ONCE DAILY [name], MD Taking Active   JARDIANCE 25 MG TABS tablet [id number] Yes Take 1 tablet by mouth once daily [name], MD Taking Active   losartan (COZAAR) 25 MG tablet [id number] Yes TAKE 1 TABLET BY MOUTH AT BEDTIME [name], MD Taking Active   metFORMIN (GLUCOPHAGE) 1000 MG tablet [id number] Yes Take 1 tablet by mouth twice daily [name], MD Taking Active   naproxen sodium (ANAPROX) 220 MG tablet [id number] Yes Take 220 mg by mouth 2 (two) times daily with a meal. [provider] Taking Active            Med Note [name]   Fri Jan 14, 2020 11:25 AM)              Patient Active Problem List   Diagnosis Date Noted   Colon cancer screening 03/25/2021   Hypertriglyceridemia 02/16/2019   Sciatica of right side associated with disorder of lumbar spine 02/11/2019   Diabetes mellitus with microalbuminuric diabetic nephropathy (HCC) 02/11/2018   Solitary pulmonary nodule 05/11/2016   Nephrolithiasis 02/27/2016   Numbness and tingling of foot 03/22/2013   History of tobacco abuse 02/22/2013   Generalized anxiety disorder 11/23/2012   Gastritis due to nonsteroidal anti-inflammatory drug 05/06/2012   NAFLD (nonalcoholic fatty liver disease) 09/22/2011   Hyperlipidemia associated with  type 2 diabetes mellitus ([place])     Immunization History  Administered Date(s) Administered   PFIZER(Purple Top)SARS-COV-2 Vaccination 05/02/2020, 05/23/2020   Pneumococcal Polysaccharide-23 02/27/2015   Tdap 05/27/2014    Conditions to be addressed/monitored: HTN, HLD, and DMII  Care Plan : Medication Monitoring  Updates made by De Hollingshead, RPH-CPP since 05/21/2021 12:00 AM     Problem: Diabetes, HTN, HLD      Long-Range Goal: Disease Progression Prevention   Start Date: 10/20/2020  This Visit's Progress: On track  Recent Progress: On track  Priority: High  Note:   Current Barriers:  Unable to achieve control of diabetes   Pharmacist Clinical Goal(s):  Over  the next 90 days, patient will achieve control of diabetes as evidenced by A1c  through collaboration with PharmD and provider.   Interventions: 1:1 collaboration with [name], Teresa L, MD regarding development and update of comprehensive plan of care as evidenced by provider attestation and co-signature Inter-disciplinary care team collaboration (see longitudinal plan of care) Comprehensive medication review performed; medication list updated in electronic medical record  Diabetes: Controlled per last A1c; current treatment: metformin 1000 mg BID, Jardiance 25 mg daily, Basaglar 12 units daily, Mounjaro 2.5 mg weekly  Hx reporting no benefit w/ DPP4, Ozempic - though today notes GI upset Received message from patient today that pharmacy was not filling script without PA. Completed PA for Mounjaro. Instructed patient to take Mounjaro savings card to the pharmacy to use in the meantime.   Hypertension: Controlled per last office reading;  current treatment: losartan 25 mg QPM  Previously recommended to continue current regimen at this time  Hyperlipidemia: Uncontrolled per last lab results; current treatment: atorvastatin 20 mg daily  Recommend goal LDL <70 given diabetes and risk factor of hypertension.  Recommend to increase atorvastatin to 40 mg daily. Will discuss with patient moving forward, but will defer two medication changes at once.   Anxiety: Uncontrolled; reports use of alprazolam 1 mg PRN, using several times weekly for work-related stress.  Moving forward, consider benefit vs side effects for initiation of option like SSRI/SNRI, or consideration of mental health support to reduce need for benzodiazepine  Back Pain: Uncontrolled; reports daily use of naproxen. Denies hx voltaren gel or lidocaine patches Previously suggested OTC voltaren gel or lidocaine patches to reduce need for daily NSAID therapy given potential renal and GI side effects.   Patient Goals/Self-Care Activities Over the next 90 days, patient will:  - take medications as prescribed check glucose at least three times daily using CGM, document, and provide at future appointments engage in dietary modifications by reducing carbohydrate contents in lunch meals  Follow Up Plan: Telephone follow up appointment with care management team member scheduled for: ~ 3 weeks as previously scheduled     Medication Assistance: None required.  Patient affirms current coverage meets needs.  Patient's preferred pharmacy is:  [id number] - [place], [place] - [address] [place] [address] Phone: [phone] Fax: [phone]   Follow Up:  Patient agrees to Care Plan and Follow-up.  Plan: Telephone follow up appointment with care management team member scheduled for:  3 weeks  Catie Travis, PharmD, BCACP, CPP Clinical Pharmacist [facility] at [place] [phone]      

## 2021-05-21 NOTE — Patient Instructions (Signed)
Visit Information   Goals Addressed               This Visit's Progress     Patient Stated     Medication Monitoring (pt-stated)        Patient Goals/Self-Care Activities Over the next 90 days, patient will:  - take medications as prescribed check glucose at least three times daily using CGM, document, and provide at future appointments engage in dietary modifications by reducing carbohydrate contents in lunch meals        Patient verbalizes understanding of instructions provided today and agrees to view in [place].   Plan: Telephone follow up appointment with care management team member scheduled for:  3 weeks  [name], PharmD, [place], [place] Clinical Pharmacist [employer] at [employer] [phone]

## 2021-05-23 ENCOUNTER — Other Ambulatory Visit: Payer: Self-pay | Admitting: Internal Medicine

## 2021-05-24 ENCOUNTER — Telehealth: Payer: Self-pay | Admitting: Pharmacist

## 2021-05-24 NOTE — Telephone Encounter (Signed)
Per cover my meds, PA for [facility] denied. Reason for denial not listed. Janett Billow, when you receive the fax for denial can you please pass to me so I can evaluate if appeal is possible/appropriate?

## 2021-05-28 ENCOUNTER — Ambulatory Visit: Payer: BC Managed Care – PPO | Admitting: Pharmacist

## 2021-05-28 DIAGNOSIS — E1121 Type 2 diabetes mellitus with diabetic nephropathy: Secondary | ICD-10-CM

## 2021-05-28 NOTE — Chronic Care Management (AMB) (Signed)
Care Management   Pharmacy Note  05/28/2021 Name: Andrew Molina MRN: [id number] DOB: Jan 30, 1976  Subjective: Andrew Molina is a 45 y.o. year old male who is a primary care patient of [name], MD. The Care Management team was consulted for assistance with care management and care coordination needs.    Engaged with patient by telephone for follow up visit in response to provider referral for pharmacy case management and/or care coordination services.   The patient was given information about Care Management services today including:  Care Management services includes personalized support from designated clinical staff supervised by the patient's primary care provider, including individualized plan of care and coordination with other care providers. 24/7 contact phone numbers for assistance for urgent and routine care needs. The patient may stop case management services at any time by phone call to the office staff.  Patient agreed to services and consent obtained.  Assessment:  Review of patient status, including review of consultants reports, laboratory and other test data, was performed as part of comprehensive evaluation and provision of chronic care management services.   SDOH (Social Determinants of Health) assessments and interventions performed:    Objective:  Lab Results  Component Value Date   CREATININE 0.67 03/20/2021   CREATININE 0.68 09/20/2020   CREATININE 0.65 03/09/2020    Lab Results  Component Value Date   HGBA1C 6.8 (H) 03/20/2021       Component Value Date/Time   CHOL 174 03/20/2021 0807   TRIG 238.0 (H) 03/20/2021 0807   HDL 41.50 03/20/2021 0807   CHOLHDL 4 03/20/2021 0807   VLDL 47.6 (H) 03/20/2021 0807   LDLCALC 85 11/22/2019 0819   LDLDIRECT 98.0 03/20/2021 0807    BP Readings from Last 3 Encounters:  03/21/21 118/70  09/20/20 122/78  03/14/20 108/64    Care Plan  Allergies  Allergen Reactions   Prednisone     Jittery.  Insomnia.    Venlafaxine Anxiety    Increased irritability    Medications Reviewed Today     Reviewed by De Hollingshead, RPH-CPP (Pharmacist) on 04/17/21 at 1149  Med List Status: <None>   Medication Order Taking? Sig Documenting Provider Last Dose Status Informant  ALPRAZolam (XANAX) 1 MG tablet [id number] Yes TAKE 1 TABLET BY MOUTH ONCE DAILY AS NEEDED FOR SLEEP - [name], MD Taking Active   atorvastatin (LIPITOR) 20 MG tablet [id number] Yes Take 1 tablet by mouth once daily [name], MD Taking Active   Continuous Blood Gluc Receiver (FREESTYLE LIBRE 2 READER) [place] [id number] Yes Use to check glucose at least 4 times daily [name], MD Taking Active   Continuous Blood Gluc Sensor (FREESTYLE LIBRE 2 SENSOR) [place] [id number] Yes Use to check glucose at least 4 times daily- Change sensor every 14 days. [name], MD Taking Active   glipiZIDE (GLUCOTROL) 5 MG tablet [id number] Yes TAKE 1/2 TO 1 (ONE-HALF TO ONE) TABLET BY MOUTH ONCE DAILY BEFORE  SUPPER [name], MD Taking Active   glucose blood test strip [id number] Yes Check sugar twice daily. One Touch Ultra strips. Dx: E11.65 [name], MD Taking Active   Insulin Glargine [facility]) 100 UNIT/ML [id number] Yes INJECT 20 UNITS SUBCUTANEOUSLY ONCE DAILY [name], MD Taking Active   Insulin Pen Needle 33G X 6 MM MISC [id number] Yes FOR USE WITH [place] KWIK PEN ONCE DAILY [name], MD Taking Active   JARDIANCE 25 MG TABS tablet [id number] Yes  Take 1 tablet by mouth once daily [name], MD Taking Active   losartan (COZAAR) 25 MG tablet [id number] Yes TAKE 1 TABLET BY MOUTH AT BEDTIME [name], MD Taking Active   metFORMIN (GLUCOPHAGE) 1000 MG tablet [id number] Yes Take 1 tablet by mouth twice daily [name], MD Taking Active   naproxen sodium (ANAPROX) 220 MG tablet [id number] Yes Take 220 mg by mouth 2 (two) times daily with a meal. [provider] Taking Active             Med Note [name]   Fri Jan 14, 2020 11:25 AM)              Patient Active Problem List   Diagnosis Date Noted   Colon cancer screening 03/25/2021   Hypertriglyceridemia 02/16/2019   Sciatica of right side associated with disorder of lumbar spine 02/11/2019   Diabetes mellitus with microalbuminuric diabetic nephropathy ([place]) 02/11/2018   Solitary pulmonary nodule 05/11/2016   Nephrolithiasis 02/27/2016   Numbness and tingling of foot 03/22/2013   History of tobacco abuse 02/22/2013   Generalized anxiety disorder 11/23/2012   Gastritis due to nonsteroidal anti-inflammatory drug 05/06/2012   NAFLD (nonalcoholic fatty liver disease) 09/22/2011   Hyperlipidemia associated with type 2 diabetes mellitus ([place])     Conditions to be addressed/monitored: HTN, HLD, and DMII  Care Plan : Medication Monitoring  Updates made by De Hollingshead, RPH-CPP since 05/28/2021 12:00 AM     Problem: Diabetes, HTN, HLD      Long-Range Goal: Disease Progression Prevention   Start Date: 10/20/2020  Recent Progress: On track  Priority: High  Note:   Current Barriers:  Unable to achieve control of diabetes   Pharmacist Clinical Goal(s):  Over the next 90 days, patient will achieve control of diabetes as evidenced by A1c  through collaboration with PharmD and provider.   Interventions: 1:1 collaboration with [name], MD regarding development and update of comprehensive plan of care as evidenced by provider attestation and co-signature Inter-disciplinary care team collaboration (see longitudinal plan of care) Comprehensive medication review performed; medication list updated in electronic medical record  Diabetes: Controlled per last A1c; current treatment: metformin 1000 mg BID, Jardiance 25 mg daily, Basaglar 12 units daily, Mounjaro 2.5 mg weekly  Hx reporting no benefit w/ DPP4, Ozempic - though today notes GI upset PA for [facility] denied, noting that patient needs  to trial and fail all formulary alternatives - Victoza, Rybelsus, and Trulicity, along with prior trial and failure of Ozempic. Submitted Provider Courtesy Review noting that [facility] demonstrated superior A1c reduction to the above mentioned options in clinical trials, so if patient failed Ozempic therapy, it is inappropriate to have to trial inferior options. Will follow for result.   Hypertension: Controlled per last office reading;  current treatment: losartan 25 mg QPM  Previously recommended to continue current regimen at this time  Hyperlipidemia: Uncontrolled per last lab results; current treatment: atorvastatin 20 mg daily  Recommend goal LDL <70 given diabetes and risk factor of hypertension. Recommend to increase atorvastatin to 40 mg daily. Will discuss with patient moving forward, but will defer two medication changes at once.   Anxiety: Uncontrolled; reports use of alprazolam 1 mg PRN, using several times weekly for work-related stress.  Moving forward, consider benefit vs side effects for initiation of option like SSRI/SNRI, or consideration of mental health support to reduce need for benzodiazepine  Back Pain: Uncontrolled; reports daily use of naproxen. Denies  hx voltaren gel or lidocaine patches Previously suggested OTC voltaren gel or lidocaine patches to reduce need for daily NSAID therapy given potential renal and GI side effects.   Patient Goals/Self-Care Activities Over the next 90 days, patient will:  - take medications as prescribed check glucose at least three times daily using CGM, document, and provide at future appointments engage in dietary modifications by reducing carbohydrate contents in lunch meals  Follow Up Plan: Telephone follow up appointment with care management team member scheduled for: ~ 3 weeks as previously scheduled     Medication Assistance:  None required.  Patient affirms current coverage meets needs.  Follow Up:  Patient agrees to Care  Plan and Follow-up.  Plan: Telephone follow up appointment with care management team member scheduled for:  3 weeks as previously scheduled  [name], PharmD, [place], [place] Clinical Pharmacist [employer] at [employer] [phone]

## 2021-05-28 NOTE — Patient Instructions (Signed)
Visit Information   Goals Addressed               This Visit's Progress     Patient Stated     Medication Monitoring (pt-stated)        Patient Goals/Self-Care Activities Over the next 90 days, patient will:  - take medications as prescribed check glucose at least three times daily using CGM, document, and provide at future appointments engage in dietary modifications by reducing carbohydrate contents in lunch meals         Patient verbalizes understanding of instructions provided today and agrees to view in [place].  Plan: Telephone follow up appointment with care management team member scheduled for:  3 weeks as previously scheduled  [name], PharmD, [place], [place] Clinical Pharmacist [employer] at [employer] [phone]

## 2021-05-29 NOTE — Telephone Encounter (Signed)
Denial letter given to Andrew Molina and she stated that appeal has been submitted.

## 2021-06-04 NOTE — Telephone Encounter (Signed)
Appeal was denied. Patient notified. He can continue to use [facility] manufacturer card for a total of 24 fills (2 years). Insurance coverage may have changed at that time.

## 2021-06-13 ENCOUNTER — Other Ambulatory Visit: Payer: Self-pay | Admitting: Internal Medicine

## 2021-06-18 ENCOUNTER — Ambulatory Visit: Payer: BC Managed Care – PPO | Admitting: Pharmacist

## 2021-06-18 DIAGNOSIS — E785 Hyperlipidemia, unspecified: Secondary | ICD-10-CM

## 2021-06-18 DIAGNOSIS — E1169 Type 2 diabetes mellitus with other specified complication: Secondary | ICD-10-CM

## 2021-06-18 DIAGNOSIS — E1165 Type 2 diabetes mellitus with hyperglycemia: Secondary | ICD-10-CM

## 2021-06-18 MED ORDER — TIRZEPATIDE 5 MG/0.5ML ~~LOC~~ SOAJ: 5.0000 mg | SUBCUTANEOUS | 2 refills | Status: DC

## 2021-06-18 MED ORDER — FREESTYLE LIBRE 3 SENSOR MISC: 1.0000 | Freq: Every day | 11 refills | Status: DC

## 2021-06-18 NOTE — Chronic Care Management (AMB) (Addendum)
Chronic Care Management CCM Pharmacy Note  06/18/2021 Name:  Andrew Molina MRN:  [id number] DOB:  [birth year]  Summary: Andrew Molina well.  Recommendations/Changes made from today's visit: - Increase Mounjaro to 5 mg weekly. Decrease Basaglar to 8 units daily, and discontinue if development of hypoglycemia. Continue metformin and Jardiance at prescribed doses  Subjective: Andrew Molina is an 45 y.o. year old male who is a primary patient of [name], [name], MD.  The CCM team was consulted for assistance with disease management and care coordination needs.    Engaged with patient by telephone for follow up visit for pharmacy case management and/or care coordination services.   Objective:  Medications Reviewed Today     Reviewed by De Hollingshead, RPH-CPP (Pharmacist) on 04/17/21 at 1149  Med List Status: <None>   Medication Order Taking? Sig Documenting Provider Last Dose Status Informant  ALPRAZolam (XANAX) 1 MG tablet [id number] Yes TAKE 1 TABLET BY MOUTH ONCE DAILY AS NEEDED FOR SLEEP - [name], MD Taking Active   atorvastatin (LIPITOR) 20 MG tablet [id number] Yes Take 1 tablet by mouth once daily [name], MD Taking Active   Continuous Blood Gluc Receiver (FREESTYLE LIBRE 2 READER) DEVI [id number] Yes Use to check glucose at least 4 times daily [name], MD Taking Active   Continuous Blood Gluc Sensor (FREESTYLE LIBRE 2 SENSOR) [place] [id number] Yes Use to check glucose at least 4 times daily- Change sensor every 14 days. [name], MD Taking Active   glipiZIDE (GLUCOTROL) 5 MG tablet [id number] Yes TAKE 1/2 TO 1 (ONE-HALF TO ONE) TABLET BY MOUTH ONCE DAILY BEFORE  SUPPER [name], MD Taking Active   glucose blood test strip [id number] Yes Check sugar twice daily. One Touch Ultra strips. Dx: E11.65 [name], MD Taking Active   Insulin Glargine [facility]) 100 UNIT/ML [id number] Yes INJECT 20 UNITS SUBCUTANEOUSLY ONCE DAILY [name], MD Taking Active   Insulin Pen Needle 33G X 6 MM MISC [id number] Yes FOR USE WITH [place] KWIK PEN ONCE DAILY [name], MD Taking Active   JARDIANCE 25 MG TABS tablet [id number] Yes Take 1 tablet by mouth once daily [name], MD Taking Active   losartan (COZAAR) 25 MG tablet [id number] Yes TAKE 1 TABLET BY MOUTH AT BEDTIME [name], MD Taking Active   metFORMIN (GLUCOPHAGE) 1000 MG tablet [id number] Yes Take 1 tablet by mouth twice daily [name], MD Taking Active   naproxen sodium (ANAPROX) 220 MG tablet [id number] Yes Take 220 mg by mouth 2 (two) times daily with a meal. [provider] Taking Active            Med Note [name]   Fri Jan 14, 2020 11:25 AM)              Pertinent Labs:   Lab Results  Component Value Date   HGBA1C 6.8 (H) 03/20/2021   Lab Results  Component Value Date   CHOL 174 03/20/2021   HDL 41.50 03/20/2021   LDLCALC 85 11/22/2019   LDLDIRECT 98.0 03/20/2021   TRIG 238.0 (H) 03/20/2021   CHOLHDL 4 03/20/2021   Lab Results  Component Value Date   CREATININE 0.67 03/20/2021   BUN 19 03/20/2021   NA 137 03/20/2021   K 4.0 03/20/2021   CL 102 03/20/2021   CO2 23 03/20/2021    SDOH:  (Social Determinants of Health) assessments and  interventions performed:  SDOH Interventions    Flowsheet Row Most Recent Value  SDOH Interventions   Financial Strain Interventions Intervention Not Indicated       CCM Care Plan  Review of patient past medical history, allergies, medications, health status, including review of consultants reports, laboratory and other test data, was performed as part of comprehensive evaluation and provision of chronic care management services.   Care Plan : Medication Monitoring  Updates made by De Hollingshead, RPH-CPP since 06/18/2021 12:00 AM     Problem: Diabetes, HTN, HLD      Long-Range Goal: Disease Progression Prevention   Start Date: 10/20/2020  Recent  Progress: On track  Priority: High  Note:   Current Barriers:  Unable to achieve control of diabetes   Pharmacist Clinical Goal(s):  Over the next 90 days, patient will achieve control of diabetes as evidenced by A1c  through collaboration with PharmD and provider.   Interventions: 1:1 collaboration with [name], MD regarding development and update of comprehensive plan of care as evidenced by provider attestation and co-signature Inter-disciplinary care team collaboration (see longitudinal plan of care) Comprehensive medication review performed; medication list updated in electronic medical record  Diabetes: Controlled per last A1c; current treatment: metformin 1000 mg BID, Jardiance 25 mg daily, Basaglar 12 units daily, Mounjaro 2.5 mg weekly  Denies any GI upset with Mounjaro Hx reporting no benefit w/ DPP4, Ozempic - though today notes GI upset Current glucose readings: using Libre 2 CGM Date of Download: 11/1-11/14/22 % Time CGM is active: 40% Average Glucose: 121 mg/dL Glucose Management Indicator: not enough data  Glucose Variability: 29.8 (goal <36%) Time in Goal:  - Time in range 70-180: 90% - Time above range: 8% - Time below range: 2% Patient interested in continuing to titrate Mounjaro to allow for Basaglar dose decrease. Increase Mounjaro to 5 mg weekly. Decrease Basaglar to 8 units daily. Continue metformin 1000 mg BID and Jardiance 25 mg daily. Advised to discontinue Basaglar if development of hypoglycemia.  Discussed update to [place] [facility]. Patient interested. Script sent  Hypertension: Controlled per last office reading;  current treatment: losartan 25 mg QPM  Reviewed nephroprotective benefit.  Previously recommended to continue current regimen at this time  Hyperlipidemia: Uncontrolled per last lab results; current treatment: atorvastatin 20 mg daily  Recommend goal LDL <70 given diabetes and risk factor of hypertension. Recommend to increase atorvastatin  to 40 mg daily. Will discuss with patient moving forward, but will defer two medication changes at once.   Anxiety: Uncontrolled; reports use of alprazolam 1 mg PRN, using several times weekly for work-related stress.  Moving forward, consider benefit vs side effects for initiation of option like SSRI/SNRI, or consideration of mental health support to reduce need for benzodiazepine  Back Pain: Uncontrolled; reports daily use of naproxen. Denies hx voltaren gel or lidocaine patches Previously suggested OTC voltaren gel or lidocaine patches to reduce need for daily NSAID therapy given potential renal and GI side effects.   Patient Goals/Self-Care Activities Over the next 90 days, patient will:  - take medications as prescribed check glucose at least three times daily using CGM, document, and provide at future appointments engage in dietary modifications by reducing carbohydrate contents in lunch meals      Plan: Telephone follow up appointment with care management team member scheduled for:  6 weeks  [name], PharmD, [place], CPP Clinical Pharmacist [place] at [facility] [phone]     I have reviewed the above  information and agree with above.   Deborra Medina, MD

## 2021-06-18 NOTE — Patient Instructions (Signed)
Visit Information  Patient Goals/Self-Care Activities Over the next 90 days, patient will:  - take medications as prescribed check glucose at least three times daily using CGM, document, and provide at future appointments engage in dietary modifications by reducing carbohydrate contents in lunch meals  Patient verbalizes understanding of instructions provided today and agrees to view in [place].   Plan: Telephone follow up appointment with care management team member scheduled for:  6 weeks  [name], PharmD, [place], [place] Clinical Pharmacist [employer] at [employer] [phone]

## 2021-06-27 ENCOUNTER — Ambulatory Visit: Payer: BC Managed Care – PPO | Admitting: Pharmacist

## 2021-06-27 DIAGNOSIS — E1165 Type 2 diabetes mellitus with hyperglycemia: Secondary | ICD-10-CM

## 2021-06-27 MED ORDER — DAPAGLIFLOZIN PROPANEDIOL 10 MG PO TABS: 10.0000 mg | ORAL_TABLET | Freq: Every day | ORAL | 1 refills | Status: DC

## 2021-06-27 NOTE — Patient Instructions (Signed)
Visit Information  Following are the goals we discussed today:  Patient Goals/Self-Care Activities Over the next 90 days, patient will:  - take medications as prescribed check glucose at least three times daily using CGM, document, and provide at future appointments engage in dietary modifications by reducing carbohydrate contents in lunch meals        Plan: Telephone follow up appointment with care management team member scheduled for:  4 weeks as previously scheduled   [name], PharmD, [place], CPP Clinical Pharmacist [place] at [facility] [phone]   Please call the care guide team at [phone] if you need to cancel or reschedule your appointment.   Patient verbalizes understanding of instructions provided today and agrees to view in [place].

## 2021-06-27 NOTE — Chronic Care Management (AMB) (Signed)
Chronic Care Management CCM Pharmacy Note  06/27/2021 Name:  Andrew Molina MRN:  [id number] DOB:  Apr 02, 1976  Summary: -Reports that Andrew Molina is no longer preferred on his insurance  Recommendations/Changes made from today's visit: - Start Farxiga 10 mg daily.   Subjective: [name] is an 45 y.o. year old male who is a primary patient of [name], [name], MD.  The CCM team was consulted for assistance with disease management and care coordination needs.    Care coordination  for  medication access  for pharmacy case management and/or care coordination services.   Objective:  Medications Reviewed Today     Reviewed by De Hollingshead, RPH-CPP (Pharmacist) on 04/17/21 at 1149  Med List Status: <None>   Medication Order Taking? Sig Documenting Provider Last Dose Status Informant  ALPRAZolam (XANAX) 1 MG tablet [id number] Yes TAKE 1 TABLET BY MOUTH ONCE DAILY AS NEEDED FOR SLEEP - [name], MD Taking Active   atorvastatin (LIPITOR) 20 MG tablet [id number] Yes Take 1 tablet by mouth once daily [name], MD Taking Active   Continuous Blood Gluc Receiver (FREESTYLE LIBRE 2 READER) DEVI [id number] Yes Use to check glucose at least 4 times daily [name], MD Taking Active   Continuous Blood Gluc Sensor (FREESTYLE LIBRE 2 SENSOR) [place] [id number] Yes Use to check glucose at least 4 times daily- Change sensor every 14 days. [name], MD Taking Active   glipiZIDE (GLUCOTROL) 5 MG tablet [id number] Yes TAKE 1/2 TO 1 (ONE-HALF TO ONE) TABLET BY MOUTH ONCE DAILY BEFORE  SUPPER [name], MD Taking Active   glucose blood test strip [id number] Yes Check sugar twice daily. One Touch Ultra strips. Dx: E11.65 [name], MD Taking Active   Insulin Glargine [facility]) 100 UNIT/ML [id number] Yes INJECT 20 UNITS SUBCUTANEOUSLY ONCE DAILY [name], MD Taking Active   Insulin Pen Needle 33G X 6 MM MISC [id number] Yes FOR USE WITH [place] KWIK PEN ONCE DAILY  [name], MD Taking Active   JARDIANCE 25 MG TABS tablet [id number] Yes Take 1 tablet by mouth once daily [name], MD Taking Active   losartan (COZAAR) 25 MG tablet [id number] Yes TAKE 1 TABLET BY MOUTH AT BEDTIME [name], MD Taking Active   metFORMIN (GLUCOPHAGE) 1000 MG tablet [id number] Yes Take 1 tablet by mouth twice daily [name], MD Taking Active   naproxen sodium (ANAPROX) 220 MG tablet [id number] Yes Take 220 mg by mouth 2 (two) times daily with a meal. [provider] Taking Active            Med Note [name]   Fri Jan 14, 2020 11:25 AM)              Pertinent Labs:   Lab Results  Component Value Date   HGBA1C 6.8 (H) 03/20/2021   Lab Results  Component Value Date   CHOL 174 03/20/2021   HDL 41.50 03/20/2021   LDLCALC 85 11/22/2019   LDLDIRECT 98.0 03/20/2021   TRIG 238.0 (H) 03/20/2021   CHOLHDL 4 03/20/2021   Lab Results  Component Value Date   CREATININE 0.67 03/20/2021   BUN 19 03/20/2021   NA 137 03/20/2021   K 4.0 03/20/2021   CL 102 03/20/2021   CO2 23 03/20/2021    SDOH:  (Social Determinants of Health) assessments and interventions performed:    CCM Care Plan  Review of patient past medical  history, allergies, medications, health status, including review of consultants reports, laboratory and other test data, was performed as part of comprehensive evaluation and provision of chronic care management services.   Care Plan : Medication Monitoring  Updates made by De Hollingshead, RPH-CPP since 06/27/2021 12:00 AM     Problem: Diabetes, HTN, HLD      Long-Range Goal: Disease Progression Prevention   Start Date: 10/20/2020  Recent Progress: On track  Priority: High  Note:   Current Barriers:  Unable to achieve control of diabetes   Pharmacist Clinical Goal(s):  Over the next 90 days, patient will achieve control of diabetes as evidenced by A1c  through collaboration with PharmD and provider.    Interventions: 1:1 collaboration with [name], MD regarding development and update of comprehensive plan of care as evidenced by provider attestation and co-signature Inter-disciplinary care team collaboration (see longitudinal plan of care) Comprehensive medication review performed; medication list updated in electronic medical record  Diabetes: Controlled per last A1c; current treatment: metformin 1000 mg BID, Jardiance 25 mg daily, Basaglar 12 units daily, Mounjaro 5 mg weekly  Denies any GI upset with Mounjaro Hx reporting no benefit w/ DPP4, Ozempic - though today notes GI upset Reports today that insurance no longer covers Jardiance. He reviewed formulary, reported that Wilder Glade is preferred but requires authorization. PA submitted. Stop Jardiance, start Farxiga 10 mg daily  Hypertension: Controlled per last office reading;  current treatment: losartan 25 mg QPM  Reviewed nephroprotective benefit.  Previously recommended to continue current regimen at this time  Hyperlipidemia: Uncontrolled per last lab results; current treatment: atorvastatin 20 mg daily  Recommend goal LDL <70 given diabetes and risk factor of hypertension. Recommend to increase atorvastatin to 40 mg daily. Will discuss with patient moving forward, but will defer two medication changes at once.   Anxiety: Uncontrolled; reports use of alprazolam 1 mg PRN, using several times weekly for work-related stress.  Moving forward, consider benefit vs side effects for initiation of option like SSRI/SNRI, or consideration of mental health support to reduce need for benzodiazepine  Back Pain: Uncontrolled; reports daily use of naproxen. Denies hx voltaren gel or lidocaine patches Previously suggested OTC voltaren gel or lidocaine patches to reduce need for daily NSAID therapy given potential renal and GI side effects.   Patient Goals/Self-Care Activities Over the next 90 days, patient will:  - take medications as  prescribed check glucose at least three times daily using CGM, document, and provide at future appointments engage in dietary modifications by reducing carbohydrate contents in lunch meals      Plan: Telephone follow up appointment with care management team member scheduled for:  4 weeks as previously scheduled  [name], PharmD, [place], [place] Pharmacist [employer] at [employer] [phone]

## 2021-06-30 ENCOUNTER — Other Ambulatory Visit: Payer: Self-pay | Admitting: Internal Medicine

## 2021-06-30 DIAGNOSIS — E1121 Type 2 diabetes mellitus with diabetic nephropathy: Secondary | ICD-10-CM

## 2021-06-30 NOTE — Assessment & Plan Note (Signed)
Wilder Glade added,   [name] d/c'd  Nov 2022

## 2021-07-19 ENCOUNTER — Other Ambulatory Visit: Payer: Self-pay | Admitting: Internal Medicine

## 2021-07-26 ENCOUNTER — Encounter: Payer: Self-pay | Admitting: Internal Medicine

## 2021-07-26 DIAGNOSIS — Z1211 Encounter for screening for malignant neoplasm of colon: Secondary | ICD-10-CM

## 2021-08-02 ENCOUNTER — Ambulatory Visit: Payer: BC Managed Care – PPO | Admitting: Pharmacist

## 2021-08-02 DIAGNOSIS — E1165 Type 2 diabetes mellitus with hyperglycemia: Secondary | ICD-10-CM

## 2021-08-02 DIAGNOSIS — E1169 Type 2 diabetes mellitus with other specified complication: Secondary | ICD-10-CM

## 2021-08-02 MED ORDER — TIRZEPATIDE 7.5 MG/0.5ML ~~LOC~~ SOAJ: 7.5000 mg | SUBCUTANEOUS | 1 refills | Status: DC

## 2021-08-02 NOTE — Patient Instructions (Signed)
Zekiel,   Increase Mounjaro to 7.5 mg weekly. Stop Engineer, agricultural.   Continue metformin 1000 mg twice daily and Farxiga 10 mg daily.   Let me know if you have any issues getting the [facility].   With CGM, a time in range of at least 70% corresponds with an A1c of less than 7%.   Take care!  [name], PharmD

## 2021-08-02 NOTE — Chronic Care Management (AMB) (Signed)
Chronic Care Management CCM Pharmacy Note  08/02/2021 Name:  Andrew Molina MRN:  [id number] DOB:  1976/01/14  Summary: - Tolerating regimen well. Interested in coming off insulin  Recommendations/Changes made from today's visit: - Increase Mounjaro to 7.5 mg weekly. Stop Engineer, agricultural. Continue metformin 1000 mg twice daily and Farxiga 10 mg daily  Subjective: Andrew Molina is an 45 y.o. year old male who is a primary patient of [name], [name], MD.  The CCM team was consulted for assistance with disease management and care coordination needs.    Engaged with patient by telephone for follow up visit for pharmacy case management and/or care coordination services.   Objective:  Medications Reviewed Today     Reviewed by De Hollingshead, RPH-CPP (Pharmacist) on 08/02/21 at 1202  Med List Status: <None>   Medication Order Taking? Sig Documenting Provider Last Dose Status Informant  ALPRAZolam (XANAX) 1 MG tablet [id number]  TAKE 1 TABLET BY MOUTH ONCE DAILY AS NEEDED FOR SLEEP - [name], MD  Active   atorvastatin (LIPITOR) 20 MG tablet [id number]  Take 1 tablet by mouth once daily [name], MD  Active   Continuous Blood Gluc Sensor (FREESTYLE LIBRE 3 SENSOR) [place] [id number] Yes Apply 1 each topically daily. Place 1 sensor on the skin every 14 days. Use to check glucose continuously [name], MD Taking Active   dapagliflozin propanediol (FARXIGA) 10 MG TABS tablet [id number] Yes Take 1 tablet (10 mg total) by mouth daily before breakfast. [name], MD Taking Active   glucose blood test strip [id number]  Check sugar twice daily. One Touch Ultra strips. Dx: E11.65 [name], MD  Active   Insulin Glargine [facility] KWIKPEN) 100 UNIT/ML [id number] Yes INJECT 20 UNITS SUBCUTANEOUSLY ONCE DAILY [name], MD Taking Active            Med Note Nat Christen Aug 02, 2021 12:02 PM) 8 units of Basaglar  Insulin Pen Needle 33G X 6 MM MISC [id number] Yes FOR  USE WITH [place] KWIK PEN ONCE DAILY [name], MD Taking Active   losartan (COZAAR) 25 MG tablet [id number]  TAKE 1 TABLET BY MOUTH AT BEDTIME [name], MD  Active   metFORMIN (GLUCOPHAGE) 1000 MG tablet [id number] Yes Take 1 tablet by mouth twice daily [name], MD Taking Active   naproxen sodium (ANAPROX) 220 MG tablet [id number]  Take 220 mg by mouth 2 (two) times daily with a meal. [provider]  Active            Med Note [name]   Fri Jan 14, 2020 11:25 AM)    tirzepatide Darcel Bayley) 5 MG/0.5ML Pen [id number] Yes Inject 5 mg into the skin once a week. [name], MD Taking Active             Pertinent Labs:  Lab Results  Component Value Date   HGBA1C 6.8 (H) 03/20/2021   Lab Results  Component Value Date   CHOL 174 03/20/2021   HDL 41.50 03/20/2021   LDLCALC 85 11/22/2019   LDLDIRECT 98.0 03/20/2021   TRIG 238.0 (H) 03/20/2021   CHOLHDL 4 03/20/2021   Lab Results  Component Value Date   CREATININE 0.67 03/20/2021   BUN 19 03/20/2021   NA 137 03/20/2021   K 4.0 03/20/2021   CL 102 03/20/2021   CO2 23 03/20/2021    SDOH:  (Social Determinants of Health)  assessments and interventions performed:  SDOH Interventions    Flowsheet Row Most Recent Value  SDOH Interventions   Financial Strain Interventions Intervention Not Indicated       CCM Care Plan  Review of patient past medical history, allergies, medications, health status, including review of consultants reports, laboratory and other test data, was performed as part of comprehensive evaluation and provision of chronic care management services.   Care Plan : Medication Monitoring  Updates made by De Hollingshead, RPH-CPP since 08/02/2021 12:00 AM  Completed 08/02/2021   Problem: Diabetes, HTN, HLD Resolved 08/02/2021     Long-Range Goal: Disease Progression Prevention Completed 08/02/2021  Start Date: 10/20/2020  Recent Progress: On track  Priority: High   Note:   Current Barriers:  Unable to achieve control of diabetes   Pharmacist Clinical Goal(s):  Over the next 90 days, patient will achieve control of diabetes as evidenced by A1c  through collaboration with PharmD and provider.   Interventions: 1:1 collaboration with [name], MD regarding development and update of comprehensive plan of care as evidenced by provider attestation and co-signature Inter-disciplinary care team collaboration (see longitudinal plan of care) Comprehensive medication review performed; medication list updated in electronic medical record  Diabetes: Controlled per last A1c; current treatment: metformin 1000 mg BID, Farxiga 10 mg daily, Basaglar 8 units daily, Mounjaro 5 mg weekly  Hx reporting no benefit w/ DPP4, Ozempic - though today notes GI upset Most recent glucose readings: using Libre 3 CGM Date of Download: 12/16-12/29/22 % Time CGM is active: 92% Average Glucose: 131 mg/dL Glucose Management Indicator: 6.4  Glucose Variability: 26 (goal <36%) Time in Goal:  - Time in range 70-180: 92% - Time above range: 8% - Time below range: 0% Observed patterns: occasional post-lunch elevations, though overall well controlled Discussed elimination of Basaglar. Patient would also like to increase Mounjaro to ensure glucose remains well controlled. Agree. Stop Basaglar, increase Mounjaro to 7.5 mg weekly. Continue metformin 1000 mg twice daily and Farxiga 10 mg daily.   Hypertension: Controlled per last office reading;  current treatment: losartan 25 mg QPM Previously recommended to continue current regimen at this time  Hyperlipidemia: Uncontrolled per last lab results; current treatment: atorvastatin 20 mg daily  Recommend goal LDL <70 given diabetes and risk factor of hypertension. Consider dose increase moving forward.   Patient Goals/Self-Care Activities Over the next 90 days, patient will:  - take medications as prescribed check glucose at  least three times daily using CGM, document, and provide at future appointments engage in dietary modifications by reducing carbohydrate contents in lunch meals      Plan: Goals of care met. Closing CCM case at this time. Can schedule virtual visit follow up if needed moving forward  [name], PharmD, [place], [place] Clinical Pharmacist [employer] at [employer] [phone]

## 2021-08-08 DIAGNOSIS — Z1211 Encounter for screening for malignant neoplasm of colon: Secondary | ICD-10-CM | POA: Diagnosis not present

## 2021-08-15 LAB — COLOGUARD: COLOGUARD: NEGATIVE

## 2021-08-18 ENCOUNTER — Other Ambulatory Visit: Payer: Self-pay | Admitting: Internal Medicine

## 2021-08-23 ENCOUNTER — Other Ambulatory Visit: Payer: Self-pay | Admitting: Internal Medicine

## 2021-09-19 ENCOUNTER — Other Ambulatory Visit: Payer: BC Managed Care – PPO

## 2021-09-20 ENCOUNTER — Telehealth: Payer: Self-pay | Admitting: Pharmacist

## 2021-09-20 ENCOUNTER — Other Ambulatory Visit: Payer: BC Managed Care – PPO

## 2021-09-20 NOTE — Telephone Encounter (Signed)
Received PA request via Cover My Meds for Mounjaro 7.5 mg weekly. Completed. Will follow for outcome (key [id number]). MyChart sent to patient .

## 2021-09-21 ENCOUNTER — Ambulatory Visit: Payer: BC Managed Care – PPO | Admitting: Internal Medicine

## 2021-09-26 NOTE — Telephone Encounter (Signed)
PA for Darcel Bayley was denied as patient has not tried and failed all formulary alternatives, however, patient has one of the older [place] savings cards that are still bypassing insurance to cover medication. They are filling Mounjaro 7.5 mg now.

## 2021-10-08 ENCOUNTER — Other Ambulatory Visit: Payer: Self-pay

## 2021-10-08 ENCOUNTER — Other Ambulatory Visit (INDEPENDENT_AMBULATORY_CARE_PROVIDER_SITE_OTHER): Payer: BC Managed Care – PPO

## 2021-10-08 DIAGNOSIS — E785 Hyperlipidemia, unspecified: Secondary | ICD-10-CM

## 2021-10-08 DIAGNOSIS — E1169 Type 2 diabetes mellitus with other specified complication: Secondary | ICD-10-CM | POA: Diagnosis not present

## 2021-10-08 LAB — MICROALBUMIN / CREATININE URINE RATIO
Creatinine,U: 88.2 mg/dL
Microalb Creat Ratio: 17.4 mg/g (ref 0.0–30.0)
Microalb, Ur: 15.3 mg/dL — ABNORMAL HIGH (ref 0.0–1.9)

## 2021-10-08 LAB — COMPREHENSIVE METABOLIC PANEL
ALT: 23 U/L (ref 0–53)
AST: 19 U/L (ref 0–37)
Albumin: 4.9 g/dL (ref 3.5–5.2)
Alkaline Phosphatase: 46 U/L (ref 39–117)
BUN: 16 mg/dL (ref 6–23)
CO2: 26 mEq/L (ref 19–32)
Calcium: 9.6 mg/dL (ref 8.4–10.5)
Chloride: 102 mEq/L (ref 96–112)
Creatinine, Ser: 0.59 mg/dL (ref 0.40–1.50)
GFR: 116.83 mL/min (ref >60.00)
Glucose, Bld: 106 mg/dL — ABNORMAL HIGH (ref 70–99)
Potassium: 4.1 mEq/L (ref 3.5–5.1)
Sodium: 138 mEq/L (ref 135–145)
Total Bilirubin: 0.6 mg/dL (ref 0.2–1.2)
Total Protein: 6.8 g/dL (ref 6.0–8.3)

## 2021-10-08 LAB — LIPID PANEL
Cholesterol: 123 mg/dL (ref 0–200)
HDL: 44.7 mg/dL (ref >39.00)
LDL Cholesterol: 64 mg/dL (ref 0–99)
NonHDL: 78.16
Total CHOL/HDL Ratio: 3
Triglycerides: 73 mg/dL (ref 0.0–149.0)
VLDL: 14.6 mg/dL (ref 0.0–40.0)

## 2021-10-08 LAB — HEMOGLOBIN A1C: Hgb A1c MFr Bld: 6.4 % (ref 4.6–6.5)

## 2021-10-10 ENCOUNTER — Encounter: Payer: Self-pay | Admitting: Internal Medicine

## 2021-10-10 ENCOUNTER — Other Ambulatory Visit: Payer: Self-pay

## 2021-10-10 ENCOUNTER — Ambulatory Visit: Payer: BC Managed Care – PPO | Admitting: Internal Medicine

## 2021-10-10 VITALS — BP 96/68 | HR 90 | Temp 98.1°F | Ht 70.0 in | Wt 148.4 lb

## 2021-10-10 DIAGNOSIS — E781 Pure hyperglyceridemia: Secondary | ICD-10-CM | POA: Diagnosis not present

## 2021-10-10 DIAGNOSIS — E785 Hyperlipidemia, unspecified: Secondary | ICD-10-CM | POA: Diagnosis not present

## 2021-10-10 DIAGNOSIS — E1169 Type 2 diabetes mellitus with other specified complication: Secondary | ICD-10-CM

## 2021-10-10 DIAGNOSIS — E1121 Type 2 diabetes mellitus with diabetic nephropathy: Secondary | ICD-10-CM

## 2021-10-10 MED ORDER — MELOXICAM 15 MG PO TABS: 15.0000 mg | ORAL_TABLET | Freq: Every day | ORAL | 0 refills | Status: DC

## 2021-10-10 MED ORDER — ALPRAZOLAM 1 MG PO TABS: ORAL_TABLET | ORAL | 5 refills | Status: DC

## 2021-10-10 NOTE — Patient Instructions (Addendum)
I AGREE NO MORE WEIGHT LOSS! ? ?Suspend metformin for a month to  see how may GI symptoms improve  ? ?Continue mounjaro  ok to stretch the interval  to 10 days as a  trial  ? ?Continue farxiga FOR NOW,  but suspend if the dizziness continues    ? ?Suspend losartan and check BP at work  in one week  ? ?Goal is 120/70  or less (but higher than 100/70)  ?

## 2021-10-10 NOTE — Progress Notes (Signed)
? ?Subjective:  ?Patient ID: Andrew Molina, male    DOB: 02-Oct-1975  Age: 46 y.o. MRN: [id number] ? ?CC: The primary encounter diagnosis was Diabetes mellitus with microalbuminuric diabetic nephropathy ([place]). Diagnoses of Hyperlipidemia associated with type 2 diabetes mellitus ([place]) and Hypertriglyceridemia were also pertinent to this visit. ? ? ?This visit occurred during the SARS-CoV-2 public health emergency.  Safety protocols were in place, including screening questions prior to the visit, additional usage of staff PPE, and extensive cleaning of exam room while observing appropriate contact time as indicated for disinfecting solutions.   ? ?HPI ?Andrew Molina presents for  ?Chief Complaint  ?Patient presents with  ? Follow-up  ?  6 month follow up on diabetes  ? ?1) T2DM: taking Mounjaro 7.5 mg  weekly.  No longer taking insulin. Has lost 30 lbs since starting the medication and his  BMI now 21.  Taking metformin and farxiga as well.  No low blood sugars,  but last week had recurrent symptoms of orthostasis, now resolved.   ? ? ?2) insomnia:  using xanax infrequently.  ? ? ? ? ?3)  right elbow hurts with [place]  ?Outpatient Medications Prior to Visit  ?Medication Sig Dispense Refill  ? atorvastatin (LIPITOR) 20 MG tablet Take 1 tablet by mouth once daily 90 tablet 0  ? Continuous Blood Gluc Sensor (FREESTYLE LIBRE 3 SENSOR) MISC Apply 1 each topically daily. Place 1 sensor on the skin every 14 days. Use to check glucose continuously 2 each 11  ? dapagliflozin propanediol (FARXIGA) 10 MG TABS tablet Take 1 tablet (10 mg total) by mouth daily before breakfast. 90 tablet 1  ? glucose blood test strip Check sugar twice daily. One Touch Ultra strips. Dx: E11.65 200 each 5  ? losartan (COZAAR) 25 MG tablet TAKE 1 TABLET BY MOUTH AT BEDTIME 90 tablet 1  ? metFORMIN (GLUCOPHAGE) 1000 MG tablet Take 1 tablet by mouth twice daily 180 tablet 1  ? naproxen sodium (ANAPROX) 220 MG tablet Take 220 mg by mouth 2 (two) times daily with  a meal.    ? tirzepatide (MOUNJARO) 7.5 MG/0.5ML Pen Inject 7.5 mg into the skin once a week. 6 mL 1  ? ALPRAZolam (XANAX) 1 MG tablet TAKE 1 TABLET BY MOUTH ONCE DAILY AS NEEDED FOR SLEEP - 30 tablet 5  ? Insulin Pen Needle 33G X 6 MM MISC FOR USE WITH BASAGLAR KWIK PEN ONCE DAILY (Patient not taking: Reported on 10/10/2021) 100 each 1  ? ?No facility-administered medications prior to visit.  ? ? ?Review of Systems; ? ?Patient denies headache, fevers, malaise, unintentional weight loss, skin rash, eye pain, sinus congestion and sinus pain, sore throat, dysphagia,  hemoptysis , cough, dyspnea, wheezing, chest pain, palpitations, orthopnea, edema, abdominal pain, nausea, melena, diarrhea, constipation, flank pain, dysuria, hematuria, urinary  Frequency, nocturia, numbness, tingling, seizures,  Focal weakness, Loss of consciousness,  Tremor, insomnia, depression, anxiety, and suicidal ideation.   ? ? ? ?Objective:  ?BP 96/68 (BP Location: Left Arm, Patient Position: Sitting, Cuff Size: Normal)   Pulse 90   Temp 98.1 ?F (36.7 ?C) (Oral)   Ht 5' 10"  (1.778 m)   Wt 148 lb 6.4 oz (67.3 kg)   SpO2 97%   BMI 21.29 kg/m?  ? ?BP Readings from Last 3 Encounters:  ?10/10/21 96/68  ?03/21/21 118/70  ?09/20/20 122/78  ? ? ?Wt Readings from Last 3 Encounters:  ?10/10/21 148 lb 6.4 oz (67.3 kg)  ?03/21/21 167 lb 6.4 oz (  75.9 kg)  ?09/20/20 173 lb 3.2 oz (78.6 kg)  ? ? ?General appearance: alert, cooperative and appears stated age ?Ears: normal TM's and external ear canals both ears ?Throat: lips, mucosa, and tongue normal; teeth and gums normal ?Neck: no adenopathy, no carotid bruit, supple, symmetrical, trachea midline and thyroid not enlarged, symmetric, no tenderness/mass/nodules ?Back: symmetric, no curvature. ROM normal. No CVA tenderness. ?Lungs: clear to auscultation bilaterally ?Heart: regular rate and rhythm, S1, S2 normal, no murmur, click, rub or gallop ?Abdomen: soft, non-tender; bowel sounds normal; no masses,  no  organomegaly ?Pulses: 2+ and symmetric ?Skin: Skin color, texture, turgor normal. No rashes or lesions ?Lymph nodes: Cervical, supraclavicular, and axillary nodes normal. ? ?Lab Results  ?Component Value Date  ? HGBA1C 6.4 10/08/2021  ? HGBA1C 6.8 (H) 03/20/2021  ? HGBA1C 7.2 (A) 09/20/2020  ? ? ?Lab Results  ?Component Value Date  ? CREATININE 0.59 10/08/2021  ? CREATININE 0.67 03/20/2021  ? CREATININE 0.68 09/20/2020  ? ? ?Lab Results  ?Component Value Date  ? WBC 6.6 02/27/2016  ? HGB 13.4 02/27/2016  ? HCT 39.1 02/27/2016  ? PLT 250.0 02/27/2016  ? GLUCOSE 106 (H) 10/08/2021  ? CHOL 123 10/08/2021  ? TRIG 73.0 10/08/2021  ? HDL 44.70 10/08/2021  ? LDLDIRECT 98.0 03/20/2021  ? [place] 64 10/08/2021  ? ALT 23 10/08/2021  ? AST 19 10/08/2021  ? NA 138 10/08/2021  ? K 4.1 10/08/2021  ? CL 102 10/08/2021  ? CREATININE 0.59 10/08/2021  ? BUN 16 10/08/2021  ? CO2 26 10/08/2021  ? TSH 1.92 08/14/2016  ? HGBA1C 6.4 10/08/2021  ? MICROALBUR 15.3 (H) 10/08/2021  ? ? ?DG Facial Bones 1-2 Views ? ?Result Date: 02/18/2019 ?CLINICAL DATA:  Foreign body post injury 1.5 months ago. EXAM: FACIAL BONES - 1-2 VIEW COMPARISON:  None. FINDINGS: There is no evidence of fracture or other significant bone abnormality. No orbital emphysema or sinus air-fluid levels are seen. There is a 6 mm metallic fragment within the soft tissues overlying the left zygomatic arch. IMPRESSION: 6 mm metallic foreign body within the soft tissues overlying the left zygomatic arch. Electronically Signed   By: Fidela Salisbury M.D.   On: 02/18/2019 14:33  ? ? ?Assessment & Plan:  ? ?Problem List Items Addressed This Visit   ? ? Diabetes mellitus with microalbuminuric diabetic nephropathy ([place]) - Primary  ?  .excellent control on current regimen of Farxiga, Mounjaro, metformin.  Weight loss has been extreme.  Advised to  Stop metformin and increase interval to 10 days . Continue losartan for proteinuria and atorvastatin  ? ?Lab Results  ?Component Value  Date  ? HGBA1C 6.4 10/08/2021  ? ?Lab Results  ?Component Value Date  ? MICROALBUR 15.3 (H) 10/08/2021  ? MICROALBUR 7.0 (H) 03/20/2021  ? ? ? ? ? ?  ?  ? Relevant Orders  ? Comprehensive metabolic panel  ? Hemoglobin A1c  ? Hyperlipidemia associated with type 2 diabetes mellitus ([place])  ?  LDL is now at  goal on current medications;   liver enzymes are  Normal as well.    No changes today.  ? ?Lab Results  ?Component Value Date  ? CHOL 123 10/08/2021  ? HDL 44.70 10/08/2021  ? [place] 64 10/08/2021  ? LDLDIRECT 98.0 03/20/2021  ? TRIG 73.0 10/08/2021  ? CHOLHDL 3 10/08/2021  ? ?Lab Results  ?Component Value Date  ? ALT 23 10/08/2021  ? AST 19 10/08/2021  ? ALKPHOS 46 10/08/2021  ?  BILITOT 0.6 10/08/2021  ? ? ?  ?  ? Hypertriglyceridemia  ?  Resolved on current regimen,  ? ?Lab Results  ?Component Value Date  ? CHOL 123 10/08/2021  ? HDL 44.70 10/08/2021  ? [place] 64 10/08/2021  ? LDLDIRECT 98.0 03/20/2021  ? TRIG 73.0 10/08/2021  ? CHOLHDL 3 10/08/2021  ? ? ?  ?  ? ? ?I spent 30 minutes dedicated to the care of this patient on the date of this encounter to include pre-visit review of patient's medical history,  most recent imaging studies, Face-to-face time with the patient , and post visit ordering of testing and therapeutics.   ? ?Follow-up: Return in about 3 months (around 01/10/2022) for follow up diabetes. ? ? ?[name], MD ?

## 2021-10-11 NOTE — Assessment & Plan Note (Addendum)
.  excellent control on current regimen of Farxiga, Mounjaro, metformin.  Weight loss has been extreme.  Advised to  Stop metformin and increase interval to 10 days . Continue losartan for proteinuria and atorvastatin  ? ?Lab Results  ?Component Value Date  ? HGBA1C 6.4 10/08/2021  ? ?Lab Results  ?Component Value Date  ? MICROALBUR 15.3 (H) 10/08/2021  ? MICROALBUR 7.0 (H) 03/20/2021  ? ? ? ? ? ?

## 2021-10-11 NOTE — Assessment & Plan Note (Signed)
LDL is now at  goal on current medications;   liver enzymes are  Normal as well.    No changes today.  ? ?Lab Results  ?Component Value Date  ? CHOL 123 10/08/2021  ? HDL 44.70 10/08/2021  ? [place] 64 10/08/2021  ? LDLDIRECT 98.0 03/20/2021  ? TRIG 73.0 10/08/2021  ? CHOLHDL 3 10/08/2021  ? ?Lab Results  ?Component Value Date  ? ALT 23 10/08/2021  ? AST 19 10/08/2021  ? ALKPHOS 46 10/08/2021  ? BILITOT 0.6 10/08/2021  ? ? ?

## 2021-10-11 NOTE — Assessment & Plan Note (Signed)
Resolved on current regimen,  ? ?Lab Results  ?Component Value Date  ? CHOL 123 10/08/2021  ? HDL 44.70 10/08/2021  ? [place] 64 10/08/2021  ? LDLDIRECT 98.0 03/20/2021  ? TRIG 73.0 10/08/2021  ? CHOLHDL 3 10/08/2021  ? ? ?

## 2021-10-17 ENCOUNTER — Encounter: Payer: Self-pay | Admitting: Internal Medicine

## 2021-10-20 ENCOUNTER — Other Ambulatory Visit: Payer: Self-pay | Admitting: Internal Medicine

## 2021-11-26 ENCOUNTER — Ambulatory Visit: Payer: BC Managed Care – PPO | Admitting: Family Medicine

## 2021-11-26 ENCOUNTER — Encounter: Payer: Self-pay | Admitting: Family Medicine

## 2021-11-26 VITALS — BP 122/84 | HR 97 | Temp 97.8°F | Resp 14 | Ht 70.0 in | Wt 142.8 lb

## 2021-11-26 DIAGNOSIS — M25521 Pain in right elbow: Secondary | ICD-10-CM | POA: Diagnosis not present

## 2021-11-26 DIAGNOSIS — M5441 Lumbago with sciatica, right side: Secondary | ICD-10-CM | POA: Diagnosis not present

## 2021-11-26 MED ORDER — PREDNISONE 20 MG PO TABS
40.0000 mg | ORAL_TABLET | Freq: Every day | ORAL | 0 refills | Status: DC
Start: 2021-11-26 — End: 2022-03-08

## 2021-11-26 MED ORDER — CYCLOBENZAPRINE HCL 10 MG PO TABS
10.0000 mg | ORAL_TABLET | Freq: Three times a day (TID) | ORAL | 0 refills | Status: DC | PRN
Start: 2021-11-26 — End: 2022-03-08

## 2021-11-26 NOTE — Patient Instructions (Signed)
Nice to see you. ?We will treat you with prednisone for your nerve impingement symptoms.  Please monitor your sugars while on this and if they get into the upper 300s or higher please let us know.  Please also let us know if you develop significant symptoms of elevated sugars regardless of what your sugar level is.  I am also going to start you on Flexeril to use as needed.  This could make you drowsy.  If it makes you excessively drowsy please stop taking it.  Please do not drive while taking the Flexeril. ?You need to take relative rest and not lift anything too heavy. ?Orthopedic should contact you to set up an evaluation for your right elbow.  Please continue to wear your brace and ice this area 3 times a day for 10 minutes at a time. ?

## 2021-11-26 NOTE — Assessment & Plan Note (Addendum)
This is an ongoing issue.  I suspect he has lateral epicondylitis.  Discussed icing the area several times a day for 10 minutes at a time.  Discussed wearing a brace.  Given the ongoing nature of this we will refer to orthopedics.  I did advise that he could go to the walk-in urgent orthopedic [facility] at [facility] if his elbow issue is worsening or not improving before he obtains an appointment with orthopedics for evaluation. ?

## 2021-11-26 NOTE — Assessment & Plan Note (Signed)
The patient has nerve impingement based on his history.  Discussed treatment with prednisone.  Advised to monitor for insomnia, excessive agitation, and increased appetite.  He will monitor his glucose while taking this medication and if it gets into the upper 300s or higher or if he develops symptoms of hypoglycemia he will let us know immediately.  We will also treat him with Flexeril.  He was counseled on the risk of drowsiness with this medication and advised not to drive or operate machinery while taking this.  Discussed if he is not improving he needs to let us know.  Advised to seek medical attention if he develops any bowel or bladder incontinence with his back pain. ?

## 2021-11-26 NOTE — Progress Notes (Signed)
?Tommi Rumps, MD ?Phone: [phone] ? ?Andrew Molina is a 46 y.o. male who presents today for follow-up. ? ?Acute back pain: Patient reports he coughed 2 days ago and felt a pop in his right low back.  Since then he has had right low back pain that will catch at times.  He has pain radiating down the anterior portion of his leg to his mid shin.  He has some numbness and tingling in that distribution as well.  He has been favoring the right leg.  He notes no bowel or bladder incontinence.  No fevers.  He does have a history of back surgery about 20 years ago.  Since then he has had intermittent issues with occasional tingling in his leg though physical therapy has been beneficial for that aspect.  Aleve has not been beneficial for his back pain. ? ?Right elbow pain: This has been going on a number of months now.  He reports he took an oral anti-inflammatory for about a month with little benefit.  He notes no specific injury.  He notes if he picks anything up in his right hand it will cause pain in the lateral epicondyle area. ? ?Patient notes his sugars have been less than 170. ? ?Social History  ? ?Tobacco Use  ?Smoking Status Former  ? Types: E-cigarettes  ? Quit date: 06/17/2000  ? Years since quitting: 21.4  ?Smokeless Tobacco Former  ? Types: Snuff  ? Quit date: 01/04/2013  ? ? ?Current Outpatient Medications on File Prior to Visit  ?Medication Sig Dispense Refill  ? ALPRAZolam (XANAX) 1 MG tablet TAKE 1 TABLET BY MOUTH ONCE DAILY AS NEEDED FOR SLEEP - 30 tablet 5  ? atorvastatin (LIPITOR) 20 MG tablet Take 1 tablet by mouth once daily 90 tablet 0  ? Continuous Blood Gluc Sensor (FREESTYLE LIBRE 3 SENSOR) MISC Apply 1 each topically daily. Place 1 sensor on the skin every 14 days. Use to check glucose continuously 2 each 11  ? dapagliflozin propanediol (FARXIGA) 10 MG TABS tablet Take 1 tablet (10 mg total) by mouth daily before breakfast. 90 tablet 1  ? glucose blood test strip Check sugar twice daily. One  Touch Ultra strips. Dx: E11.65 200 each 5  ? metFORMIN (GLUCOPHAGE) 1000 MG tablet Take 1 tablet by mouth twice daily 180 tablet 1  ? naproxen sodium (ANAPROX) 220 MG tablet Take 220 mg by mouth 2 (two) times daily with a meal.    ? tirzepatide (MOUNJARO) 7.5 MG/0.5ML Pen Inject 7.5 mg into the skin once a week. 6 mL 1  ? losartan (COZAAR) 25 MG tablet TAKE 1 TABLET BY MOUTH AT BEDTIME (Patient not taking: Reported on 11/26/2021) 90 tablet 1  ? ?No current facility-administered medications on file prior to visit.  ? ? ? ?ROS see history of present illness ? ?Objective ? ?Physical Exam ?Vitals:  ? 11/26/21 1133  ?BP: 122/84  ?Pulse: 97  ?Resp: 14  ?Temp: 97.8 ?F (36.6 ?C)  ?SpO2: 98%  ? ? ?BP Readings from Last 3 Encounters:  ?11/26/21 122/84  ?10/10/21 96/68  ?03/21/21 118/70  ? ?Wt Readings from Last 3 Encounters:  ?11/26/21 142 lb 12.8 oz (64.8 kg)  ?10/10/21 148 lb 6.4 oz (67.3 kg)  ?03/21/21 167 lb 6.4 oz (75.9 kg)  ? ? ?Physical Exam ?Musculoskeletal:  ?   Comments: No midline spine tenderness, no midline spine step-off, no muscular back tenderness, right elbow with some tenderness at the lateral epicondyles, there is discomfort on resisted  pronation and supination in the lateral epicondyle area, there is no swelling, warmth, or erythema of the elbow on the right  ?Neurological:  ?   Comments: 5/5 strength bilateral quads, hamstrings, plantarflexion, and dorsiflexion, sensation to light touch intact bilateral lower extremities  ? ? ? ?Assessment/Plan: Please see individual problem list. ? ?Problem List Items Addressed This Visit   ? ? Acute right-sided low back pain with right-sided sciatica  ?  The patient has nerve impingement based on his history.  Discussed treatment with prednisone.  Advised to monitor for insomnia, excessive agitation, and increased appetite.  He will monitor his glucose while taking this medication and if it gets into the upper 300s or higher or if he develops symptoms of hypoglycemia he  will let us know immediately.  We will also treat him with Flexeril.  He was counseled on the risk of drowsiness with this medication and advised not to drive or operate machinery while taking this.  Discussed if he is not improving he needs to let us know.  Advised to seek medical attention if he develops any bowel or bladder incontinence with his back pain. ? ?  ?  ? Relevant Medications  ? predniSONE (DELTASONE) 20 MG tablet  ? cyclobenzaprine (FLEXERIL) 10 MG tablet  ? Right elbow pain - Primary  ?  This is an ongoing issue.  I suspect he has lateral epicondylitis.  Discussed icing the area several times a day for 10 minutes at a time.  Discussed wearing a brace.  Given the ongoing nature of this we will refer to orthopedics.  I did advise that he could go to the walk-in urgent orthopedic clinic at [facility] if his elbow issue is worsening or not improving before he obtains an appointment with orthopedics for evaluation. ? ?  ?  ? Relevant Orders  ? Ambulatory referral to Orthopedic Surgery  ? ? ? ?Return if symptoms worsen or fail to improve. ? ?This visit occurred during the SARS-CoV-2 public health emergency.  Safety protocols were in place, including screening questions prior to the visit, additional usage of staff PPE, and extensive cleaning of exam room while observing appropriate contact time as indicated for disinfecting solutions.  ? ? ?Tommi Rumps, MD ?[place] ? ?

## 2021-11-27 DIAGNOSIS — M7711 Lateral epicondylitis, right elbow: Secondary | ICD-10-CM | POA: Diagnosis not present

## 2021-12-27 ENCOUNTER — Other Ambulatory Visit: Payer: Self-pay | Admitting: Internal Medicine

## 2021-12-27 DIAGNOSIS — E1165 Type 2 diabetes mellitus with hyperglycemia: Secondary | ICD-10-CM

## 2022-01-09 ENCOUNTER — Other Ambulatory Visit: Payer: BC Managed Care – PPO

## 2022-01-11 ENCOUNTER — Ambulatory Visit: Payer: BC Managed Care – PPO | Admitting: Internal Medicine

## 2022-01-25 ENCOUNTER — Other Ambulatory Visit: Payer: BC Managed Care – PPO

## 2022-02-01 ENCOUNTER — Other Ambulatory Visit: Payer: Self-pay | Admitting: Internal Medicine

## 2022-02-01 DIAGNOSIS — E1165 Type 2 diabetes mellitus with hyperglycemia: Secondary | ICD-10-CM

## 2022-02-20 ENCOUNTER — Other Ambulatory Visit: Payer: Self-pay

## 2022-02-20 DIAGNOSIS — E1165 Type 2 diabetes mellitus with hyperglycemia: Secondary | ICD-10-CM

## 2022-02-20 MED ORDER — MOUNJARO 7.5 MG/0.5ML ~~LOC~~ SOAJ: SUBCUTANEOUS | 0 refills | Status: DC

## 2022-03-01 ENCOUNTER — Other Ambulatory Visit (INDEPENDENT_AMBULATORY_CARE_PROVIDER_SITE_OTHER): Payer: BC Managed Care – PPO

## 2022-03-01 DIAGNOSIS — E1121 Type 2 diabetes mellitus with diabetic nephropathy: Secondary | ICD-10-CM

## 2022-03-01 LAB — COMPREHENSIVE METABOLIC PANEL
ALT: 28 U/L (ref 0–53)
AST: 21 U/L (ref 0–37)
Albumin: 4.8 g/dL (ref 3.5–5.2)
Alkaline Phosphatase: 55 U/L (ref 39–117)
BUN: 18 mg/dL (ref 6–23)
CO2: 28 mEq/L (ref 19–32)
Calcium: 9.4 mg/dL (ref 8.4–10.5)
Chloride: 102 mEq/L (ref 96–112)
Creatinine, Ser: 0.66 mg/dL (ref 0.40–1.50)
GFR: 112.62 mL/min (ref >60.00)
Glucose, Bld: 152 mg/dL — ABNORMAL HIGH (ref 70–99)
Potassium: 4 mEq/L (ref 3.5–5.1)
Sodium: 139 mEq/L (ref 135–145)
Total Bilirubin: 0.6 mg/dL (ref 0.2–1.2)
Total Protein: 7.1 g/dL (ref 6.0–8.3)

## 2022-03-01 LAB — HEMOGLOBIN A1C: Hgb A1c MFr Bld: 6.6 % — ABNORMAL HIGH (ref 4.6–6.5)

## 2022-03-08 ENCOUNTER — Ambulatory Visit (INDEPENDENT_AMBULATORY_CARE_PROVIDER_SITE_OTHER): Payer: BC Managed Care – PPO

## 2022-03-08 ENCOUNTER — Encounter: Payer: Self-pay | Admitting: Internal Medicine

## 2022-03-08 ENCOUNTER — Ambulatory Visit: Payer: BC Managed Care – PPO | Admitting: Internal Medicine

## 2022-03-08 VITALS — BP 114/72 | HR 66 | Temp 97.8°F | Ht 70.0 in | Wt 139.4 lb

## 2022-03-08 DIAGNOSIS — R634 Abnormal weight loss: Secondary | ICD-10-CM

## 2022-03-08 DIAGNOSIS — E1169 Type 2 diabetes mellitus with other specified complication: Secondary | ICD-10-CM | POA: Diagnosis not present

## 2022-03-08 DIAGNOSIS — R202 Paresthesia of skin: Secondary | ICD-10-CM

## 2022-03-08 DIAGNOSIS — E1121 Type 2 diabetes mellitus with diabetic nephropathy: Secondary | ICD-10-CM

## 2022-03-08 DIAGNOSIS — R200 Anesthesia of skin: Secondary | ICD-10-CM

## 2022-03-08 DIAGNOSIS — K760 Fatty (change of) liver, not elsewhere classified: Secondary | ICD-10-CM

## 2022-03-08 DIAGNOSIS — F411 Generalized anxiety disorder: Secondary | ICD-10-CM

## 2022-03-08 DIAGNOSIS — M7989 Other specified soft tissue disorders: Secondary | ICD-10-CM | POA: Diagnosis not present

## 2022-03-08 DIAGNOSIS — E785 Hyperlipidemia, unspecified: Secondary | ICD-10-CM

## 2022-03-08 NOTE — Patient Instructions (Addendum)
I WANT YOU TO GAIN 5 LBS.    PLEASE  LENGTHEN THE INTERVAL BETWEEN MOUNJARO DOSES TO EVERY 10 DAYS  AND WEIGH ON MONDAY AND THEN WEEKLY .  IF WEIGHT DOES NOT INCREASE  .  MOVE DOSE TO EVERY 2 WEEKS   Continue current metformin and farxiga    Mounjaro  weight weekly.    Increase protein to 60 grams daily   Start weight training once weight stabilizes

## 2022-03-08 NOTE — Assessment & Plan Note (Signed)
.   losartan  Has been discontinued due to hypotension , ;  He has minimal  proteinuria  Lab Results  Component Value Date   HGBA1C 6.6 (H) 03/01/2022   Lab Results  Component Value Date   MICROALBUR 15.3 (H) 10/08/2021   MICROALBUR 7.0 (H) 03/20/2021

## 2022-03-08 NOTE — Assessment & Plan Note (Signed)
Chronic,  Dorsal surface not plantar.  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

## 2022-03-08 NOTE — Assessment & Plan Note (Signed)
Managed with rare use of xanax. Did not tolerate Effexor trial. The risks and benefits of benzodiazepine use were discussed with patient today including excessive sedation leading to respiratory depression,  impaired thinking/driving, and addiction.  Patient was advised to avoid concurrent use with alcohol, to use medication only as needed and not to share with others  .

## 2022-03-08 NOTE — Assessment & Plan Note (Signed)
Triglycerides have normalized with weight loss and LDL is at goal on atorvastatin.  a1c is excellent.  Given his rapid and excessive weight loss,  I have advised him to increase the interval of Mounjaro administration to every 10 days and follow his weekly weights/. Goal is a weight gain of 5-10 lbs. Continue Farxiga and metformin to avoid hyperglycemia.

## 2022-03-08 NOTE — Assessment & Plan Note (Signed)
Managed with statin, low glycemic index  and control of diabetes,  Liver enzymes are normal .reminded to limit use of nsaids, and add tylenol up to 1000 mg daily if needed    Lab Results  Component Value Date   ALT 28 03/01/2022   AST 21 03/01/2022   ALKPHOS 55 03/01/2022   BILITOT 0.6 03/01/2022

## 2022-03-08 NOTE — Progress Notes (Signed)
Subjective:  Patient ID: Andrew Molina, male    DOB: March 19, 1976  Age: 46 y.o. MRN: [id number]  CC: The primary encounter diagnosis was Diabetes mellitus with microalbuminuric diabetic nephropathy (HCC). Diagnoses of Unintentional weight loss, Numbness and tingling of foot, Hyperlipidemia associated with type 2 diabetes mellitus (HCC), NAFLD (nonalcoholic fatty liver disease), and Generalized anxiety disorder were also pertinent to this visit.   HPI TALLIN HART presents for  Chief Complaint  Patient presents with   Follow-up    Diabetes Follow up    1) USING Freestyle Libre 3,  CBG downloaded for the past 2 weeks.  Average glu 128  , in range 93 % of the time . Using mounjaro 7.5 mg weekly,  metformin and Farxiga.      2) WEight lost :  Has lost 34 lbs since February start of [facility] for management of diabetes .  WAS NOT OVERWIEGHT TO BEGIN WITH .  Appetite suppressed,  diet reviewed, no abd pain.  Up to date on screenings    Outpatient Medications Prior to Visit  Medication Sig Dispense Refill   ALPRAZolam (XANAX) 1 MG tablet TAKE 1 TABLET BY MOUTH ONCE DAILY AS NEEDED FOR SLEEP - 30 tablet 5   atorvastatin (LIPITOR) 20 MG tablet Take 1 tablet by mouth once daily 90 tablet 0   Continuous Blood Gluc Sensor (FREESTYLE LIBRE 3 SENSOR) MISC Apply 1 each topically daily. Place 1 sensor on the skin every 14 days. Use to check glucose continuously 2 each 11   FARXIGA 10 MG TABS tablet TAKE 1 TABLET BY MOUTH ONCE DAILY BEFORE BREAKFAST 90 tablet 0   glucose blood test strip Check sugar twice daily. One Touch Ultra strips. Dx: E11.65 200 each 5   metFORMIN (GLUCOPHAGE) 1000 MG tablet Take 1 tablet by mouth twice daily 180 tablet 1   tirzepatide (MOUNJARO) 7.5 MG/0.5ML Pen INJECT ONE SYRINGEFUL INTO THE SKIN ONCE WEEKLY 12 mL 0   cyclobenzaprine (FLEXERIL) 10 MG tablet Take 1 tablet (10 mg total) by mouth 3 (three) times daily as needed for muscle spasms. (Patient not taking: Reported on 03/08/2022)  15 tablet 0   losartan (COZAAR) 25 MG tablet TAKE 1 TABLET BY MOUTH AT BEDTIME (Patient not taking: Reported on 11/26/2021) 90 tablet 1   naproxen sodium (ANAPROX) 220 MG tablet Take 220 mg by mouth 2 (two) times daily with a meal. (Patient not taking: Reported on 03/08/2022)     predniSONE (DELTASONE) 20 MG tablet Take 2 tablets (40 mg total) by mouth daily with breakfast. (Patient not taking: Reported on 03/08/2022) 10 tablet 0   No facility-administered medications prior to visit.    Review of Systems;  Patient denies headache, fevers, malaise, unintentional weight loss, skin rash, eye pain, sinus congestion and sinus pain, sore throat, dysphagia,  hemoptysis , cough, dyspnea, wheezing, chest pain, palpitations, orthopnea, edema, abdominal pain, nausea, melena, diarrhea, constipation, flank pain, dysuria, hematuria, urinary  Frequency, nocturia, numbness, tingling, seizures,  Focal weakness, Loss of consciousness,  Tremor, insomnia, depression, anxiety, and suicidal ideation.      Objective:  BP 114/72 (BP Location: Left Arm, Patient Position: Sitting, Cuff Size: Normal)   Pulse 66   Temp 97.8 F (36.6 C) (Oral)   Ht 5\' 10"  (1.778 m)   Wt 139 lb 6.4 oz (63.2 kg)   SpO2 99%   BMI 20.00 kg/m   BP Readings from Last 3 Encounters:  03/08/22 114/72  11/26/21 122/84  10/10/21 96/68  Wt Readings from Last 3 Encounters:  03/08/22 139 lb 6.4 oz (63.2 kg)  11/26/21 142 lb 12.8 oz (64.8 kg)  10/10/21 148 lb 6.4 oz (67.3 kg)    General appearance: GAUNT, alert, cooperative and appears older than  stated age Ears: normal TM's and external ear canals both ears Throat: lips, mucosa, and tongue normal; teeth and gums normal Neck: no adenopathy, no carotid bruit, supple, symmetrical, trachea midline and thyroid not enlarged, symmetric, no tenderness/mass/nodules Back: symmetric, no curvature. ROM normal. No CVA tenderness. Lungs: clear to auscultation bilaterally Heart: regular rate and  rhythm, S1, S2 normal, no murmur, click, rub or gallop Abdomen: soft, non-tender; bowel sounds normal; no masses,  no organomegaly Pulses: 2+ and symmetric Skin: Skin color, texture, turgor normal. No rashes or lesions Lymph nodes: Cervical, supraclavicular, and axillary nodes normal.  Lab Results  Component Value Date   HGBA1C 6.6 (H) 03/01/2022   HGBA1C 6.4 10/08/2021   HGBA1C 6.8 (H) 03/20/2021    Lab Results  Component Value Date   CREATININE 0.66 03/01/2022   CREATININE 0.59 10/08/2021   CREATININE 0.67 03/20/2021    Lab Results  Component Value Date   WBC 6.6 02/27/2016   HGB 13.4 02/27/2016   HCT 39.1 02/27/2016   PLT 250.0 02/27/2016   GLUCOSE 152 (H) 03/01/2022   CHOL 123 10/08/2021   TRIG 73.0 10/08/2021   HDL 44.70 10/08/2021   LDLDIRECT 98.0 03/20/2021   LDLCALC 64 10/08/2021   ALT 28 03/01/2022   AST 21 03/01/2022   NA 139 03/01/2022   K 4.0 03/01/2022   CL 102 03/01/2022   CREATININE 0.66 03/01/2022   BUN 18 03/01/2022   CO2 28 03/01/2022   TSH 1.92 08/14/2016   HGBA1C 6.6 (H) 03/01/2022   MICROALBUR 15.3 (H) 10/08/2021    DG Facial Bones 1-2 Views  Result Date: 02/18/2019 CLINICAL DATA:  Foreign body post injury 1.5 months ago. EXAM: FACIAL BONES - 1-2 VIEW COMPARISON:  None. FINDINGS: There is no evidence of fracture or other significant bone abnormality. No orbital emphysema or sinus air-fluid levels are seen. There is a 6 mm metallic fragment within the soft tissues overlying the left zygomatic arch. IMPRESSION: 6 mm metallic foreign body within the soft tissues overlying the left zygomatic arch. Electronically Signed   By: Ted Mcalpine M.D.   On: 02/18/2019 14:33    Assessment & Plan:   Problem List Items Addressed This Visit     Numbness and tingling of foot    Chronic,  Dorsal surface not plantar.  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  a1c is excellent.  Given his rapid and excessive weight loss,  I have advised him to increase the interval of Mounjaro administration to every 10 days and follow his weekly weights/. Goal is a weight gain of 5-10  lbs. Continue Farxiga and metformin to avoid hyperglycemia.       Generalized anxiety disorder    Managed with rare use of xanax. Did not tolerate Effexor trial. The risks and benefits of benzodiazepine use were discussed with patient today including excessive sedation leading to respiratory depression,  impaired thinking/driving, and addiction.  Patient was advised to avoid concurrent use with alcohol, to use medication only as needed and not to share with others  .       Diabetes mellitus with microalbuminuric diabetic nephropathy (HCC) - Primary    . losartan  Has been discontinued due to hypotension , ;  He has minimal  proteinuria  Lab Results  Component Value Date   HGBA1C 6.6 (H) 03/01/2022   Lab Results  Component Value Date   MICROALBUR 15.3 (H) 10/08/2021   MICROALBUR 7.0 (H) 03/20/2021            Relevant Orders   Hemoglobin A1c   Comprehensive metabolic panel   Other Visit Diagnoses     Unintentional weight loss       Relevant Orders   TSH       I spent a total of   minutes with this patient in a face to face visit on the date of this encounter reviewing the last office visit with me on        ,  most recent with patient's cardiologist in    ,  patient'ss diet and eating habits, home blood pressure  readings ,  most recent imaging study ,   and post visit ordering of testing and therapeutics.    Follow-up: Return in about 3 months (around 06/08/2022).   Sherlene Shams, MD

## 2022-03-26 ENCOUNTER — Encounter: Payer: Self-pay | Admitting: Internal Medicine

## 2022-03-27 ENCOUNTER — Ambulatory Visit: Payer: BC Managed Care – PPO | Admitting: Family Medicine

## 2022-03-27 ENCOUNTER — Encounter: Payer: Self-pay | Admitting: Family Medicine

## 2022-03-27 VITALS — BP 122/76 | HR 88 | Temp 97.7°F | Ht 70.0 in | Wt 140.2 lb

## 2022-03-27 DIAGNOSIS — M5441 Lumbago with sciatica, right side: Secondary | ICD-10-CM | POA: Diagnosis not present

## 2022-03-27 MED ORDER — KETOROLAC TROMETHAMINE 60 MG/2ML IM SOLN
60.0000 mg | Freq: Once | INTRAMUSCULAR | Status: AC
  Administered 2022-03-27: 30 mg via INTRAMUSCULAR

## 2022-03-27 MED ORDER — GABAPENTIN 600 MG PO TABS: 300.0000 mg | ORAL_TABLET | Freq: Every day | ORAL | 0 refills | Status: DC

## 2022-03-27 MED ORDER — PANTOPRAZOLE SODIUM 40 MG PO TBEC: 40.0000 mg | DELAYED_RELEASE_TABLET | Freq: Every day | ORAL | 0 refills | Status: DC

## 2022-03-27 MED ORDER — KETOROLAC TROMETHAMINE 30 MG/ML IJ SOLN: 30.0000 mg | Freq: Once | INTRAMUSCULAR | Status: DC

## 2022-03-27 MED ORDER — CELECOXIB 200 MG PO CAPS: 200.0000 mg | ORAL_CAPSULE | Freq: Two times a day (BID) | ORAL | 0 refills | Status: DC

## 2022-03-27 NOTE — Patient Instructions (Addendum)
It was a pleasure meeting you today. Thank you for allowing me to take part in your health care.  Our goals for today as we discussed include:  For your back pain You are given pain medication called Toradol.  This is a nonsteroidal anti-inflammatory.  Do not take any Aleve, Advil, ibuprofen or other NSAID-containing products for at least 8 hours after injection. Start Celebrex 200 mg.  Take 1 tablet twice daily for 14 days.  You can start this medication at 10:00 tonight. Take Protonix 40 mg daily for 14 days. Start Gabapentin 300 mg at night. Can use Diclofenac gel 4 times daily Can use Biofreeze gel 4 times daily Start lower back exercises once you are pain-free for 24-48 hours You need more structured   If you find that you lose a sensation to urinate or move your bowels please go to the emergency department  Please follow-up with PCP in 2 weeks sooner if pain worsens  If you have any questions or concerns, please do not hesitate to call the office at [phone].  I look forward to our next visit and until then take care and stay safe.  Regards,   Dana Allan, MD   [facility]    Sciatica Rehab Ask your health care provider which exercises are safe for you. Do exercises exactly as told by your health care provider and adjust them as directed. It is normal to feel mild stretching, pulling, tightness, or discomfort as you do these exercises. Stop right away if you feel sudden pain or your pain gets worse. Do not begin these exercises until told by your health care provider. Stretching and range-of-motion exercises These exercises warm up your muscles and joints and improve the movement and flexibility of your hips and back. These exercises also help to relieve pain, numbness, and tingling. Sciatic nerve glide  Sit in a chair with your head facing down toward your chest. Place your hands behind your back. Let your shoulders slump forward. Slowly straighten one  of your legs while you tilt your head back as if you are looking toward the ceiling. Only straighten your leg as far as you can without making your symptoms worse. Hold this position for __________ seconds. Slowly return your leg and head back to the starting position. Repeat with your other leg. Repeat __________ times. Complete this exercise __________ times a day. Knee to chest with hip adduction and internal rotation  Lie on your back on a firm surface with both legs straight. Bend one of your knees and move it up toward your chest until you feel a gentle stretch in your lower back and buttock. Then, move your knee toward the shoulder that is on the opposite side from your leg. This is hip adduction and internal rotation. Hold your leg in this position by holding on to the front of your knee. Hold this position for __________ seconds. Slowly return to the starting position. Repeat with your other leg. Repeat __________ times. Complete this exercise __________ times a day. Prone extension on elbows  Lie on your abdomen on a firm surface. A bed may be too soft for this exercise. Prop yourself up on your elbows. Use your arms to help lift your chest up until you feel a gentle stretch in your abdomen and your lower back. This will place some of your body weight on your elbows. If this is uncomfortable, try stacking pillows under your chest. Your hips should stay down, against the surface  that you are lying on. Keep your hip and back muscles relaxed. Hold this position for __________ seconds. Slowly relax your upper body and return to the starting position. Repeat __________ times. Complete this exercise __________ times a day. Strengthening exercises These exercises build strength and endurance in your back. Endurance is the ability to use your muscles for a long time, even after they get tired. Pelvic tilt This exercise strengthens the muscles that lie deep in the abdomen. Lie on your  back on a firm surface. Bend your knees and keep your feet flat on the surface. Tense your abdominal muscles. Tip your pelvis up toward the ceiling and flatten your lower back into the firm surface. To help with this exercise, you may place a small towel under your lower back and try to push your back into the towel. Hold this position for __________ seconds. Let your muscles relax completely before you repeat this exercise. Repeat __________ times. Complete this exercise __________ times a day. Alternating arm and leg raises  Get on your hands and knees on a firm surface. If you are on a hard floor, you may want to use padding, such as an exercise mat, to cushion your knees. Line up your arms and legs. Your hands should be directly below your shoulders, and your knees should be directly below your hips. Lift your left leg behind you. At the same time, raise your right arm and straighten it in front of you. Do not lift your leg higher than your hip. Do not lift your arm higher than your shoulder. Keep your abdominal and back muscles tight. Keep your hips facing the ground. Do not arch your back. Keep your balance carefully, and do not hold your breath. Hold this position for __________ seconds. Slowly return to the starting position. Repeat with your right leg and your left arm. Repeat __________ times. Complete this exercise __________ times a day. Posture and body mechanics Good posture and healthy body mechanics can help to relieve stress in your body's tissues and joints. Body mechanics refers to the movements and positions of your body while you do your daily activities. Posture is part of body mechanics. Good posture means: Your spine is in its natural S-curve position (neutral). Your shoulders are pulled back slightly. Your head is not tipped forward. Follow these guidelines to improve your posture and body mechanics in your everyday activities. Standing  When standing, keep your  spine neutral and your feet about hip width apart. Keep a slight bend in your knees. Your ears, shoulders, and hips should line up. When you do a task in which you stand in one place for a long time, place one foot up on a stable object that is 2-4 inches (5-10 cm) high, such as a footstool. This helps keep your spine neutral. Sitting  When sitting, keep your spine neutral and keep your feet flat on the floor. Use a footrest, if necessary, and keep your thighs parallel to the floor. Avoid rounding your shoulders, and avoid tilting your head forward. When working at a desk or a computer, keep your desk at a height where your hands are slightly lower than your elbows. Slide your chair under your desk so you are close enough to maintain good posture. When working at a computer, place your monitor at a height where you are looking straight ahead and you do not have to tilt your head forward or downward to look at the screen. Resting  When lying down and  resting, avoid positions that are most painful for you. If you have pain with activities such as sitting, bending, stooping, or squatting, lie in a position in which your body does not bend very much. For example, avoid curling up on your side with your arms and knees near your chest (fetal position). If you have pain with activities such as standing for a long time or reaching with your arms, lie with your spine in a neutral position and bend your knees slightly. Try the following positions: Lying on your side with a pillow between your knees. Lying on your back with a pillow under your knees. Lifting  When lifting objects, keep your feet at least shoulder width apart and tighten your abdominal muscles. Bend your knees and hips and keep your spine neutral. It is important to lift using the strength of your legs, not your back. Do not lock your knees straight out. Always ask for help to lift heavy or awkward objects. This information is not intended to  replace advice given to you by your health care provider. Make sure you discuss any questions you have with your health care provider. Document Revised: 10/30/2021 Document Reviewed: 10/30/2021 Elsevier Patient Education  2023 [employer].

## 2022-03-27 NOTE — Progress Notes (Signed)
Per Dr. Clent Ridges I administered 30 mg of Toradol into the upper outer quadrant of patient -Pain rated at 7.  Patient sat for 20-25 minutes and patient stated his pain had came down to a 5 and he felt ok . Patient was told if he developed any symptoms to call and let us know or go to UC and let them know the treatment we gave. Patient voiced understanding and is agreeable.

## 2022-03-27 NOTE — Progress Notes (Signed)
    SUBJECTIVE:   CHIEF COMPLAINT / HPI: right lower back pain  Back pain Patient reports low back pain for 4-5 days.  Was mowing lawn 5 days ago, fell asleep in recliner and awoke with pain in back.  Endorses Right lower back pain that radiates across buttocks and down right leg.  Intermittent numbness,  Sitting makes pain worse. Tried Aleve, heat/ice without relief.  Difficult to sleep at night with pain.  Denies any fevers, incontinence of bowel/bladder, lower extremity weakness or saddle anesthesia.    PERTINENT  PMH / PSH:  Ruptured disc x 3 s/p microdiscectomy 20 years ago Right Sciatica DM Type 2 Gastritis secondary to NSAID use 2013  OBJECTIVE:   BP 122/76 (BP Location: Left Arm, Patient Position: Sitting, Cuff Size: Normal)   Pulse 88   Temp 97.7 F (36.5 C) (Oral)   Ht 5\' 10"  (1.778 m)   Wt 140 lb 3.2 oz (63.6 kg)   SpO2 99%   BMI 20.12 kg/m    General: Alert, no acute distress Cardio: Normal S1 and S2, RRR, no r/m/g Pulm: CTAB, normal work of breathing Back - Normal skin, Spine with normal alignment and no deformity.  No tenderness to vertebral process palpation.  Paraspinous muscles are tender and with spasm.   Tenderness at the SI area.Range of motion is full at neck and limited lumbar sacral regions secondary to pain. Sensation, motor and gait normal    ASSESSMENT/PLAN:   Acute right-sided low back pain with right-sided sciatica Suspect flare of radicular pain.  No red flags. Unable to take Prednisone due to allergy. -Toradol 30 mg IM now -Start Celebrex 100 mg BID x 14 days -Start Protonix 40 mg daily -Start Gabapentin 300 mg at night x 14 days -Heat/Ice as needed -Can use Diclofenac gel four times daily as needed -Can use Biofreeze three to four times daily as needed -Strict return precautions provided -Follow up with PCP in 2 weeks or sooner if symptoms worsen    PDMP reviewed  , MD

## 2022-03-29 DIAGNOSIS — Z0100 Encounter for examination of eyes and vision without abnormal findings: Secondary | ICD-10-CM | POA: Diagnosis not present

## 2022-03-29 DIAGNOSIS — D3131 Benign neoplasm of right choroid: Secondary | ICD-10-CM | POA: Diagnosis not present

## 2022-03-29 LAB — HM DIABETES EYE EXAM

## 2022-04-05 ENCOUNTER — Encounter: Payer: Self-pay | Admitting: Family Medicine

## 2022-04-05 ENCOUNTER — Other Ambulatory Visit: Payer: Self-pay | Admitting: Internal Medicine

## 2022-04-05 DIAGNOSIS — E1165 Type 2 diabetes mellitus with hyperglycemia: Secondary | ICD-10-CM

## 2022-04-05 NOTE — Assessment & Plan Note (Signed)
Suspect flare of radicular pain.  No red flags. Unable to take Prednisone due to allergy. -Toradol 30 mg IM now -Start Celebrex 100 mg BID x 14 days -Start Protonix 40 mg daily -Start Gabapentin 300 mg at night x 14 days -Heat/Ice as needed -Can use Diclofenac gel four times daily as needed -Can use Biofreeze three to four times daily as needed -Strict return precautions provided -Follow up with PCP in 2 weeks or sooner if symptoms worsen

## 2022-04-20 ENCOUNTER — Other Ambulatory Visit: Payer: Self-pay | Admitting: Family

## 2022-04-26 ENCOUNTER — Other Ambulatory Visit: Payer: Self-pay | Admitting: Internal Medicine

## 2022-04-26 MED ORDER — ATORVASTATIN CALCIUM 20 MG PO TABS: 20.0000 mg | ORAL_TABLET | Freq: Every day | ORAL | 0 refills | Status: DC

## 2022-05-19 ENCOUNTER — Other Ambulatory Visit: Payer: Self-pay | Admitting: Internal Medicine

## 2022-06-03 ENCOUNTER — Other Ambulatory Visit: Payer: Self-pay | Admitting: Internal Medicine

## 2022-06-14 ENCOUNTER — Other Ambulatory Visit: Payer: BC Managed Care – PPO

## 2022-06-21 ENCOUNTER — Other Ambulatory Visit (INDEPENDENT_AMBULATORY_CARE_PROVIDER_SITE_OTHER): Payer: BC Managed Care – PPO

## 2022-06-21 DIAGNOSIS — R634 Abnormal weight loss: Secondary | ICD-10-CM | POA: Diagnosis not present

## 2022-06-21 DIAGNOSIS — E1121 Type 2 diabetes mellitus with diabetic nephropathy: Secondary | ICD-10-CM

## 2022-06-21 LAB — TSH: TSH: 1.6 u[IU]/mL (ref 0.35–5.50)

## 2022-06-21 LAB — COMPREHENSIVE METABOLIC PANEL
ALT: 26 U/L (ref 0–53)
AST: 22 U/L (ref 0–37)
Albumin: 4.8 g/dL (ref 3.5–5.2)
Alkaline Phosphatase: 53 U/L (ref 39–117)
BUN: 18 mg/dL (ref 6–23)
CO2: 26 mEq/L (ref 19–32)
Calcium: 9.3 mg/dL (ref 8.4–10.5)
Chloride: 102 mEq/L (ref 96–112)
Creatinine, Ser: 0.63 mg/dL (ref 0.40–1.50)
GFR: 113.97 mL/min (ref >60.00)
Glucose, Bld: 90 mg/dL (ref 70–99)
Potassium: 4 mEq/L (ref 3.5–5.1)
Sodium: 138 mEq/L (ref 135–145)
Total Bilirubin: 0.4 mg/dL (ref 0.2–1.2)
Total Protein: 6.7 g/dL (ref 6.0–8.3)

## 2022-06-21 LAB — HEMOGLOBIN A1C: Hgb A1c MFr Bld: 6.8 % — ABNORMAL HIGH (ref 4.6–6.5)

## 2022-07-08 ENCOUNTER — Other Ambulatory Visit: Payer: Self-pay | Admitting: Internal Medicine

## 2022-07-08 DIAGNOSIS — E1165 Type 2 diabetes mellitus with hyperglycemia: Secondary | ICD-10-CM

## 2022-07-09 ENCOUNTER — Other Ambulatory Visit: Payer: Self-pay | Admitting: Internal Medicine

## 2022-07-09 DIAGNOSIS — E1165 Type 2 diabetes mellitus with hyperglycemia: Secondary | ICD-10-CM

## 2022-07-10 MED ORDER — DAPAGLIFLOZIN PROPANEDIOL 10 MG PO TABS: 10.0000 mg | ORAL_TABLET | Freq: Every day | ORAL | 2 refills | Status: DC

## 2022-07-16 NOTE — Telephone Encounter (Signed)
MyChart messgae sent to patient. 

## 2022-07-16 NOTE — Progress Notes (Signed)
No show

## 2022-08-05 ENCOUNTER — Other Ambulatory Visit: Payer: Self-pay | Admitting: Internal Medicine

## 2022-08-05 DIAGNOSIS — E1165 Type 2 diabetes mellitus with hyperglycemia: Secondary | ICD-10-CM

## 2022-09-13 ENCOUNTER — Encounter: Payer: Self-pay | Admitting: Internal Medicine

## 2022-09-13 ENCOUNTER — Ambulatory Visit: Payer: BC Managed Care – PPO | Admitting: Internal Medicine

## 2022-09-13 VITALS — BP 110/70 | HR 67 | Temp 97.7°F | Ht 70.0 in | Wt 139.2 lb

## 2022-09-13 DIAGNOSIS — Z8042 Family history of malignant neoplasm of prostate: Secondary | ICD-10-CM

## 2022-09-13 DIAGNOSIS — Z1211 Encounter for screening for malignant neoplasm of colon: Secondary | ICD-10-CM

## 2022-09-13 DIAGNOSIS — E1169 Type 2 diabetes mellitus with other specified complication: Secondary | ICD-10-CM | POA: Diagnosis not present

## 2022-09-13 DIAGNOSIS — Z800 Family history of malignant neoplasm of digestive organs: Secondary | ICD-10-CM

## 2022-09-13 DIAGNOSIS — E1165 Type 2 diabetes mellitus with hyperglycemia: Secondary | ICD-10-CM | POA: Diagnosis not present

## 2022-09-13 DIAGNOSIS — E1121 Type 2 diabetes mellitus with diabetic nephropathy: Secondary | ICD-10-CM

## 2022-09-13 DIAGNOSIS — Z809 Family history of malignant neoplasm, unspecified: Secondary | ICD-10-CM | POA: Insufficient documentation

## 2022-09-13 DIAGNOSIS — R202 Paresthesia of skin: Secondary | ICD-10-CM

## 2022-09-13 DIAGNOSIS — R200 Anesthesia of skin: Secondary | ICD-10-CM

## 2022-09-13 DIAGNOSIS — E785 Hyperlipidemia, unspecified: Secondary | ICD-10-CM

## 2022-09-13 DIAGNOSIS — Z8041 Family history of malignant neoplasm of ovary: Secondary | ICD-10-CM

## 2022-09-13 MED ORDER — MOUNJARO 7.5 MG/0.5ML ~~LOC~~ SOAJ: SUBCUTANEOUS | 0 refills | Status: DC

## 2022-09-13 MED ORDER — METFORMIN HCL 1000 MG PO TABS: 1000.0000 mg | ORAL_TABLET | Freq: Two times a day (BID) | ORAL | 0 refills | Status: DC

## 2022-09-13 MED ORDER — ATORVASTATIN CALCIUM 20 MG PO TABS: 20.0000 mg | ORAL_TABLET | Freq: Every day | ORAL | 0 refills | Status: DC

## 2022-09-13 NOTE — Assessment & Plan Note (Signed)
LDL is at goal and LFTs are normal. Continue atorvastatin 20 mg daily   Lab Results  Component Value Date   CHOL 123 10/08/2021   HDL 44.70 10/08/2021   LDLCALC 64 10/08/2021   LDLDIRECT 98.0 03/20/2021   TRIG 73.0 10/08/2021   CHOLHDL 3 10/08/2021

## 2022-09-13 NOTE — Patient Instructions (Addendum)
STOP METFORMIN AND CONTINUE MOUNJARO AND FARXIGA   IF SUGARS BECOME ELEVATED  BUT WEIGHT GAIN IS OCCURRING (OUR OBJECTIVE IS WEIGHT GAIN) ,  SEND ME A MESSAGE AND I WILL ADD GLIPIZIDE TO CONTROL BLOOD SUGARS    ATORVASTATIN IS STANDARD OF CARE FOR TYPE 2 DIABETES AS PRIMARY PREVENTION OF HEART DISEASE  REFERRAL TO ONCOLOGY DIVISION FOR GENETICS COUNSELLING

## 2022-09-13 NOTE — Assessment & Plan Note (Signed)
Weight loss has  been excessive on current mounjaro dose but patient notes hyperglycemia at lower dose (5 mg) .and with extended interval.     Will stop metformin to enable some weight gain,  and add glipizide if hyperglycemia occurs

## 2022-09-13 NOTE — Progress Notes (Signed)
Subjective:  Patient ID: Andrew Molina, male    DOB: August 23, 1975  Age: 47 y.o. MRN: [id number]  CC: The primary encounter diagnosis was Hyperlipidemia associated with type 2 diabetes mellitus ([place]). Diagnoses of Type 2 diabetes mellitus with hyperglycemia, without long-term current use of insulin ([place]), Diabetes mellitus with microalbuminuric diabetic nephropathy ([place]), Family history of prostate cancer, Family history of colorectal cancer, Family history of ovarian cancer, Numbness and tingling of foot, Family history of cancer, and Colon cancer screening were also pertinent to this visit.   HPI Andrew Molina presents for  Chief Complaint  Patient presents with   Medical Management of Chronic Issues    6 month follow up    1) Type 2 DM: He feels generally well, is exercising several times per week and checking blood sugars frequently  at variable times.   Taking his medications as directed. Following a carbohydrate modified diet 6 days per week. Denies numbness, burning and tingling of extremities.    he has had  EXCESSIVE WEIGHT LOSS ON MOUNJARO 7.5 MG, however a trial of lower 5 mg dose resulted in HYPERGLYCEMIA WITHOUT WEIGHT GAIN.  A tiral of an extended interval  of ten days between 7.5 mg doses  resulted in  hyperglycemia .  He states that he is eating  3 TIMES DAILY WITH 2  SNACKS.   FAMILY HISTORY OF  CA: his paternal UNCLE and his paternal grandfather both had  been diagnosed with Prostate CA  , his unclear at an age < 32 FOLLOWED BY colorectal CA  .His  MOTHER HAD LUNG CA WHICH METASTASIZED.  FATHER SMOKES BUT HAS NOT HAD ANY CA.  PATERNAL GM HAD OVARIAN CA   Outpatient Medications Prior to Visit  Medication Sig Dispense Refill   ALPRAZolam (XANAX) 1 MG tablet TAKE 1 TABLET BY MOUTH ONCE DAILY AS NEEDED FOR SLEEP 30 tablet 5   celecoxib (CELEBREX) 200 MG capsule Take 1 capsule (200 mg total) by mouth 2 (two) times daily. 28 capsule 0   Continuous Blood Gluc Sensor (FREESTYLE LIBRE 3  SENSOR) MISC Apply 1 each topically daily. Place 1 sensor on the skin every 14 days. Use to check glucose continuously 2 each 11   dapagliflozin propanediol (FARXIGA) 10 MG TABS tablet Take 1 tablet (10 mg total) by mouth daily before breakfast. 90 tablet 2   glucose blood test strip Check sugar twice daily. One Touch Ultra strips. Dx: E11.65 200 each 5   naproxen sodium (ALEVE) 220 MG tablet Take 220 mg by mouth daily as needed.     atorvastatin (LIPITOR) 20 MG tablet Take 1 tablet (20 mg total) by mouth daily. 90 tablet 0   metFORMIN (GLUCOPHAGE) 1000 MG tablet Take 1 tablet by mouth twice daily 180 tablet 0   tirzepatide (MOUNJARO) 7.5 MG/0.5ML Pen INJECT 1 DOSE SUBCUTANEOUSLY ONCE A WEEK 12 mL 0   gabapentin (NEURONTIN) 600 MG tablet Take 0.5 tablets (300 mg total) by mouth at bedtime for 14 days. 7 tablet 0   pantoprazole (PROTONIX) 40 MG tablet Take 1 tablet (40 mg total) by mouth daily for 14 days. 14 tablet 0   No facility-administered medications prior to visit.    Review of Systems;  Patient denies headache, fevers, malaise, unintentional weight loss, skin rash, eye pain, sinus congestion and sinus pain, sore throat, dysphagia,  hemoptysis , cough, dyspnea, wheezing, chest pain, palpitations, orthopnea, edema, abdominal pain, nausea, melena, diarrhea, constipation, flank pain, dysuria, hematuria, urinary  Frequency, nocturia, numbness,  tingling, seizures,  Focal weakness, Loss of consciousness,  Tremor, insomnia, depression, anxiety, and suicidal ideation.      Objective:  BP 110/70   Pulse 67   Temp 97.7 F (36.5 C) (Oral)   Ht 5\' 10"  (1.778 m)   Wt 139 lb 3.2 oz (63.1 kg)   SpO2 99%   BMI 19.97 kg/m   BP Readings from Last 3 Encounters:  09/13/22 110/70  03/27/22 122/76  03/08/22 114/72    Wt Readings from Last 3 Encounters:  09/13/22 139 lb 3.2 oz (63.1 kg)  03/27/22 140 lb 3.2 oz (63.6 kg)  03/08/22 139 lb 6.4 oz (63.2 kg)    Physical Exam Vitals reviewed.   Constitutional:      General: He is not in acute distress.    Appearance: Normal appearance. He is normal weight. He is not ill-appearing, toxic-appearing or diaphoretic.  HENT:     Head: Normocephalic and atraumatic.     Right Ear: Tympanic membrane, ear canal and external ear normal. There is no impacted cerumen.     Left Ear: Tympanic membrane, ear canal and external ear normal. There is no impacted cerumen.     Nose: Nose normal.     Mouth/Throat:     Mouth: Mucous membranes are moist.     Pharynx: Oropharynx is clear.  Eyes:     General: No scleral icterus.       Right eye: No discharge.        Left eye: No discharge.     Conjunctiva/sclera: Conjunctivae normal.  Neck:     Thyroid: No thyromegaly.     Vascular: No carotid bruit or JVD.  Cardiovascular:     Rate and Rhythm: Normal rate and regular rhythm.     Heart sounds: Normal heart sounds.  Pulmonary:     Effort: Pulmonary effort is normal. No respiratory distress.     Breath sounds: Normal breath sounds.  Abdominal:     General: Bowel sounds are normal.     Palpations: Abdomen is soft. There is no mass.     Tenderness: There is no abdominal tenderness. There is no guarding or rebound.  Musculoskeletal:        General: Normal range of motion.     Cervical back: Normal range of motion and neck supple.  Lymphadenopathy:     Cervical: No cervical adenopathy.  Skin:    General: Skin is warm and dry.  Neurological:     General: No focal deficit present.     Mental Status: He is alert and oriented to person, place, and time. Mental status is at baseline.  Psychiatric:        Mood and Affect: Mood normal.        Behavior: Behavior normal.        Thought Content: Thought content normal.        Judgment: Judgment normal.     Lab Results  Component Value Date   HGBA1C 6.8 (H) 06/21/2022   HGBA1C 6.6 (H) 03/01/2022   HGBA1C 6.4 10/08/2021    Lab Results  Component Value Date   CREATININE 0.63 06/21/2022    CREATININE 0.66 03/01/2022   CREATININE 0.59 10/08/2021    Lab Results  Component Value Date   WBC 6.6 02/27/2016   HGB 13.4 02/27/2016   HCT 39.1 02/27/2016   PLT 250.0 02/27/2016   GLUCOSE 90 06/21/2022   CHOL 123 10/08/2021   TRIG 73.0 10/08/2021   HDL 44.70 10/08/2021   LDLDIRECT  98.0 03/20/2021   LDLCALC 64 10/08/2021   ALT 26 06/21/2022   AST 22 06/21/2022   NA 138 06/21/2022   K 4.0 06/21/2022   CL 102 06/21/2022   CREATININE 0.63 06/21/2022   BUN 18 06/21/2022   CO2 26 06/21/2022   TSH 1.60 06/21/2022   HGBA1C 6.8 (H) 06/21/2022   MICROALBUR 15.3 (H) 10/08/2021    DG Facial Bones 1-2 Views  Result Date: 02/18/2019 CLINICAL DATA:  Foreign body post injury 1.5 months ago. EXAM: FACIAL BONES - 1-2 VIEW COMPARISON:  None. FINDINGS: There is no evidence of fracture or other significant bone abnormality. No orbital emphysema or sinus air-fluid levels are seen. There is a 6 mm metallic fragment within the soft tissues overlying the left zygomatic arch. IMPRESSION: 6 mm metallic foreign body within the soft tissues overlying the left zygomatic arch. Electronically Signed   By: Fidela Salisbury M.D.   On: 02/18/2019 14:33    Assessment & Plan:  .Hyperlipidemia associated with type 2 diabetes mellitus ([place]) Assessment & Plan: LDL is at goal and LFTs are normal. Continue atorvastatin 20 mg daily   Lab Results  Component Value Date   CHOL 123 10/08/2021   HDL 44.70 10/08/2021   LDLCALC 64 10/08/2021   LDLDIRECT 98.0 03/20/2021   TRIG 73.0 10/08/2021   CHOLHDL 3 10/08/2021     Orders: -     Lipid panel; Future -     LDL cholesterol, direct; Future  Type 2 diabetes mellitus with hyperglycemia, without long-term current use of insulin (HCC) -     Atorvastatin Calcium; Take 1 tablet (20 mg total) by mouth daily.  Dispense: 90 tablet; Refill: 0 -     Mounjaro; INJECT 1 DOSE SUBCUTANEOUSLY ONCE A WEEK  Dispense: 12 mL; Refill: 0  Diabetes mellitus with  microalbuminuric diabetic nephropathy (HCC) Assessment & Plan: Weight loss has  been excessive on current mounjaro dose but patient notes hyperglycemia at lower dose (5 mg) .and with extended interval.     Will stop metformin to enable some weight gain,  and add glipizide if hyperglycemia occurs   Orders: -     Hemoglobin A1c; Future -     Comprehensive metabolic panel; Future -     Microalbumin / creatinine urine ratio; Future  Family history of prostate cancer -     PSA; Future -     Ambulatory referral to Genetics  Family history of colorectal cancer -     Ambulatory referral to Genetics  Family history of ovarian cancer -     Ambulatory referral to Genetics  Numbness and tingling of foot Assessment & Plan: Foot exam is normal today and sensation is intact to monofilament   Family history of cancer Assessment & Plan: Referring to genetics counselling    Colon cancer screening Assessment & Plan: Negative Colougard at age 68.  No first degree family members with colon CA    Other orders -     metFORMIN HCl; Take 1 tablet (1,000 mg total) by mouth 2 (two) times daily.  Dispense: 180 tablet; Refill: 0     I provided 30 minutes of face-to-face time during this encounter reviewing patient's last visit with me, patient's  most recent  surgical and non surgical procedures, previous  labs and imaging studies, counseling on currently addressed issues,  and post visit ordering to diagnostics and therapeutics .   Follow-up: Return in about 4 months (around 01/12/2023) for follow up diabetes.   [name],  MD 

## 2022-09-13 NOTE — Assessment & Plan Note (Signed)
Foot exam is normal today and sensation is intact to monofilament

## 2022-09-13 NOTE — Assessment & Plan Note (Signed)
Referring to genetics counselling

## 2022-09-15 NOTE — Assessment & Plan Note (Signed)
Negative Colougard at age 47.  No first degree family members with colon CA

## 2022-09-27 ENCOUNTER — Other Ambulatory Visit (INDEPENDENT_AMBULATORY_CARE_PROVIDER_SITE_OTHER): Payer: BC Managed Care – PPO

## 2022-09-27 DIAGNOSIS — Z8042 Family history of malignant neoplasm of prostate: Secondary | ICD-10-CM | POA: Diagnosis not present

## 2022-09-27 DIAGNOSIS — E1121 Type 2 diabetes mellitus with diabetic nephropathy: Secondary | ICD-10-CM

## 2022-09-27 DIAGNOSIS — E1169 Type 2 diabetes mellitus with other specified complication: Secondary | ICD-10-CM | POA: Diagnosis not present

## 2022-09-27 DIAGNOSIS — E785 Hyperlipidemia, unspecified: Secondary | ICD-10-CM | POA: Diagnosis not present

## 2022-09-27 LAB — COMPREHENSIVE METABOLIC PANEL
ALT: 32 U/L (ref 0–53)
AST: 23 U/L (ref 0–37)
Albumin: 4.7 g/dL (ref 3.5–5.2)
Alkaline Phosphatase: 51 U/L (ref 39–117)
BUN: 15 mg/dL (ref 6–23)
CO2: 28 mEq/L (ref 19–32)
Calcium: 9.4 mg/dL (ref 8.4–10.5)
Chloride: 102 mEq/L (ref 96–112)
Creatinine, Ser: 0.69 mg/dL (ref 0.40–1.50)
GFR: 110.68 mL/min (ref >60.00)
Glucose, Bld: 124 mg/dL — ABNORMAL HIGH (ref 70–99)
Potassium: 4.1 mEq/L (ref 3.5–5.1)
Sodium: 139 mEq/L (ref 135–145)
Total Bilirubin: 0.9 mg/dL (ref 0.2–1.2)
Total Protein: 6.7 g/dL (ref 6.0–8.3)

## 2022-09-27 LAB — LIPID PANEL
Cholesterol: 121 mg/dL (ref 0–200)
HDL: 52.2 mg/dL (ref >39.00)
LDL Cholesterol: 58 mg/dL (ref 0–99)
NonHDL: 68.98
Total CHOL/HDL Ratio: 2
Triglycerides: 55 mg/dL (ref 0.0–149.0)
VLDL: 11 mg/dL (ref 0.0–40.0)

## 2022-09-27 LAB — MICROALBUMIN / CREATININE URINE RATIO
Creatinine,U: 56.2 mg/dL
Microalb Creat Ratio: 6 mg/g (ref 0.0–30.0)
Microalb, Ur: 3.4 mg/dL — ABNORMAL HIGH (ref 0.0–1.9)

## 2022-09-27 LAB — LDL CHOLESTEROL, DIRECT: Direct LDL: 54 mg/dL

## 2022-09-27 LAB — HEMOGLOBIN A1C: Hgb A1c MFr Bld: 6 % (ref 4.6–6.5)

## 2022-09-27 LAB — PSA: PSA: 0.6 ng/mL (ref 0.10–4.00)

## 2022-10-08 ENCOUNTER — Inpatient Hospital Stay: Payer: BC Managed Care – PPO

## 2022-10-08 ENCOUNTER — Encounter: Payer: Self-pay | Admitting: Licensed Clinical Social Worker

## 2022-10-08 ENCOUNTER — Inpatient Hospital Stay: Payer: BC Managed Care – PPO | Attending: Oncology | Admitting: Licensed Clinical Social Worker

## 2022-10-08 DIAGNOSIS — Z801 Family history of malignant neoplasm of trachea, bronchus and lung: Secondary | ICD-10-CM

## 2022-10-08 DIAGNOSIS — Z803 Family history of malignant neoplasm of breast: Secondary | ICD-10-CM

## 2022-10-08 DIAGNOSIS — Z8042 Family history of malignant neoplasm of prostate: Secondary | ICD-10-CM

## 2022-10-08 DIAGNOSIS — Z809 Family history of malignant neoplasm, unspecified: Secondary | ICD-10-CM | POA: Diagnosis not present

## 2022-10-08 DIAGNOSIS — Z800 Family history of malignant neoplasm of digestive organs: Secondary | ICD-10-CM | POA: Diagnosis not present

## 2022-10-08 NOTE — Progress Notes (Signed)
REFERRING PROVIDER: [name], MD [address],  [place] [address]  PRIMARY PROVIDER:  [name], MD  PRIMARY REASON FOR VISIT:  1. Family history of breast cancer   2. Family history of colon cancer   3. Family history of prostate cancer   4. Family history of pancreatic cancer   5. Family history of lung cancer      HISTORY OF PRESENT ILLNESS:   [name], a 47 y.o. male, was seen for a [place] cancer genetics consultation at the request of Dr. Derrel Nip due to a family history of cancer.  [name] presents to clinic today to discuss the possibility of a hereditary predisposition to cancer, genetic testing, and to further clarify his future cancer risks, as well as potential cancer risks for family members.   CANCER HISTORY:  [name] is a 47 y.o. male with no personal history of cancer.   He has normal PSA screening recently and normal cologuard at 45.  Past Medical History:  Diagnosis Date   Cataract    Diabetes mellitus 2005   Hyperlipidemia    Hypertension    Other chronic nonalcoholic liver disease     Past Surgical History:  Procedure Laterality Date   SPINE SURGERY     microdiskectomy   VASECTOMY  june 3023   Andrew Molina    FAMILY HISTORY:  We obtained a detailed, 4-generation family history.  Significant diagnoses are listed below: Family History  Problem Relation Age of Onset   Diabetes Mother    Hypertension Mother    Lung cancer Mother 87   Hypertension Father    Heart disease Father    Skin cancer Father    Breast cancer Paternal Aunt 50   Brain cancer Paternal Aunt 59       glioblastoma   Breast cancer Paternal Aunt 32   Colon cancer Paternal Uncle 69   Testicular cancer Paternal Uncle 29   Breast cancer Paternal Grandmother 76       + possible ovarian   Colon cancer Paternal Grandfather 44       metastatic   Prostate cancer Other    Pancreatic cancer Other    Pancreatic cancer Other    Prostate cancer  Other    Andrew Molina has 1 son, 3 and 1 daughter, 61. He has 1 sister, 18, no cancers.  Andrew Molina mother died of metastatic lung cancer at 49. No other cancers on her side of the family.  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 Patient had 4 paternal uncles and 2 aunts. One uncle had testicular cancer at 47 and died of colon cancer at 33. An aunt had breast cancer at 62 and died of glioblastoma at 26. The other aunt had breast cancer at 42. Andrew Molina paternal grandmother died at 73 and had history of breast cancer at 84 and possibly ovarian cancer as well. She had 3 brothers that had cancer - one had prostate and pancreatic, another had pancreatic, and the last brother had bladder cancer. Andrew Molina paternal great grandmother also had cancer and died at 30. Andrew Molina paternal grandfather had metastatic colon cancer and died at 40. One of his brothers died of prostate cancer at 27 and another brother had lymphoma in his 68s.  Andrew Molina is unaware of previous family history of genetic testing for hereditary cancer risks. There is no reported  Ashkenazi Jewish ancestry. There is no known consanguinity.    GENETIC COUNSELING ASSESSMENT: [name] is a 47 y.o. male with a family history of cancer which is somewhat suggestive of a hereditary cancer syndrome and predisposition to cancer. We, therefore, discussed and recommended the following at today's visit.   DISCUSSION: We discussed that approximately 10% of breast/prostate/pancreatic/colon cancer is hereditary. Most cases of hereditary breast/pancreatic/prostate cancer are associated with BRCA1/BRCA2 genes, although there are other genes associated with hereditary cancer as well, including genes associated with colon cancer. Cancers and risks are gene specific. We discussed that testing is beneficial for several reasons including knowing about cancer risks, identifying potential  screening and risk-reduction options that may be appropriate, and to understand if other family members could be at risk for cancer and allow them to undergo genetic testing.   We reviewed the characteristics, features and inheritance patterns of hereditary cancer syndromes. We also discussed genetic testing, including the appropriate family members to test, the process of testing, insurance coverage and turn-around-time for results. We discussed the implications of a negative, positive and/or variant of uncertain significant result. We recommended [name] pursue genetic testing for the [facility] Multi-Cancer+RNA gene panel.   Based on [name] family history of cancer, he meets medical criteria for genetic testing. Despite that he meets criteria, he may still have an out of pocket cost. We discussed that if his out of pocket cost for testing is over $100, the laboratory will call and confirm whether he wants to proceed with testing.  If the out of pocket cost of testing is less than $100 he will be billed by the genetic testing laboratory.   We discussed that some people do not want to undergo genetic testing due to fear of genetic discrimination.  A federal law called the Genetic Information Non-Discrimination Act (GINA) of 2008 helps protect individuals against genetic discrimination based on their genetic test results.  It impacts both health insurance and employment.  For health insurance, it protects against increased premiums, being kicked off insurance or being forced to take a test in order to be insured.  For employment it protects against hiring, firing and promoting decisions based on genetic test results.  Health status due to a cancer diagnosis is not protected under GINA.  This law does not protect life insurance, disability insurance, or other types of insurance.   PLAN: After considering the risks, benefits, and limitations, [name] provided informed consent to pursue genetic testing and  the blood sample was sent to [facility] for analysis of the Multi-Cancer+RNA panel. Results should be available within approximately 2-3 weeks' time, at which point they will be disclosed by telephone to Mr. Selfe, as will any additional recommendations warranted by these results. Mr. Voisine will receive a summary of his genetic counseling visit and a copy of his results once available. This information will also be available in Epic.   Mr. Garside questions were answered to his satisfaction today. Our contact information was provided should additional questions or concerns arise. Thank you for the referral and allowing [place] to share in the care of your patient.   Faith Rogue, MS, [facility] Genetic Counselor [email or web address] Phone: [phone]  The patient was seen for a total of 35 minutes in face-to-face genetic counseling.  Dr. Grayland Ormond was available for discussion regarding this case.   _______________________________________________________________________ For Office Staff:  Number of people involved in session: 1 Was an Intern/ student involved with case: no

## 2022-10-16 ENCOUNTER — Encounter: Payer: Self-pay | Admitting: Internal Medicine

## 2022-10-22 ENCOUNTER — Encounter: Payer: Self-pay | Admitting: Licensed Clinical Social Worker

## 2022-10-22 ENCOUNTER — Telehealth: Payer: Self-pay | Admitting: Licensed Clinical Social Worker

## 2022-10-25 ENCOUNTER — Encounter: Payer: Self-pay | Admitting: Licensed Clinical Social Worker

## 2022-10-25 DIAGNOSIS — Z1379 Encounter for other screening for genetic and chromosomal anomalies: Secondary | ICD-10-CM | POA: Insufficient documentation

## 2022-10-25 NOTE — Telephone Encounter (Signed)
I contacted Mr. Wallock to discuss his genetic testing results. No pathogenic variants were identified in the 70 genes analyzed. Detailed clinic note to follow.   The test report has been scanned into EPIC and is located under the Molecular Pathology section of the Results Review tab.  A portion of the result report is included below for reference.      Faith Rogue, MS, [facility] Genetic Counselor [email or web address] Phone: [phone]

## 2022-10-28 ENCOUNTER — Ambulatory Visit: Payer: Self-pay | Admitting: Licensed Clinical Social Worker

## 2022-10-28 DIAGNOSIS — Z1379 Encounter for other screening for genetic and chromosomal anomalies: Secondary | ICD-10-CM

## 2022-10-28 NOTE — Progress Notes (Signed)
HPI:   [name] was previously seen in the [facility] due to a family history of cancer and concerns regarding a hereditary predisposition to cancer. Please refer to our prior cancer genetics clinic note for more information regarding our discussion, assessment and recommendations, at the time. [name] recent genetic test results were disclosed to him, as were recommendations warranted by these results. These results and recommendations are discussed in more detail below.  CANCER HISTORY:  Oncology History   No history exists.    FAMILY HISTORY:  We obtained a detailed, 4-generation family history.  Significant diagnoses are listed below: Family History  Problem Relation Age of Onset   Diabetes Mother    Hypertension Mother    Lung cancer Mother 22   Hypertension Father    Heart disease Father    Skin cancer Father    Breast cancer Paternal Aunt 83   Brain cancer Paternal Aunt 58       glioblastoma   Breast cancer Paternal Aunt 31   Colon cancer Paternal Uncle 51   Testicular cancer Paternal Uncle 29   Breast cancer Paternal Grandmother 7       + possible ovarian   Colon cancer Paternal Grandfather 54       metastatic   Prostate cancer Other    Pancreatic cancer Other    Pancreatic cancer Other    Prostate cancer Other       [name] has 1 son, 32 and 1 daughter, 63. He has 1 sister, 82, no cancers.   [name] mother died of metastatic lung cancer at 65. No other cancers on her side of the family.   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 Patient had 4 paternal uncles and 2 aunts. One uncle had testicular cancer at 46 and died of colon cancer at 52. An aunt had breast cancer at 22 and died of glioblastoma at 23. The other aunt had breast cancer at 43. [name] paternal grandmother died at 41 and had history of breast cancer at 28 and possibly ovarian cancer as well.  She had 3 brothers that had cancer - one had prostate and pancreatic, another had pancreatic, and the last brother had bladder cancer. [name] paternal great grandmother also had cancer and died at 82. [name] paternal grandfather had metastatic colon cancer and died at 33. One of his brothers died of prostate cancer at 52 and another brother had lymphoma in his 27s.   [name] is unaware of previous family history of genetic testing for hereditary cancer risks. There is no reported Ashkenazi Jewish ancestry. There is no known consanguinity.     GENETIC TEST RESULTS:  The [facility] Multi-Cancer+RNA Panel found no pathogenic mutations.   The Multi-Cancer + RNA Panel offered by [facility] includes sequencing and/or deletion/duplication analysis of the following 70 genes:  AIP*, ALK, APC*, ATM*, AXIN2*, BAP1*, BARD1*, BLM*, BMPR1A*, BRCA1*, BRCA2*, BRIP1*, CDC73*, CDH1*, CDK4, CDKN1B*, CDKN2A, CHEK2*, CTNNA1*, DICER1*, EPCAM, EGFR, FH*, FLCN*, GREM1, HOXB13, KIT, LZTR1, MAX*, MBD4, MEN1*, MET, MITF, MLH1*, MSH2*, MSH3*, MSH6*, MUTYH*, NF1*, NF2*, NTHL1*, PALB2*, PDGFRA, PMS2*, POLD1*, Andrew Molina*, POT1*, PRKAR1A*, PTCH1*, PTEN*, RAD51C*, RAD51D*, RB1*, RET, SDHA*, SDHAF2*, SDHB*, SDHC*, SDHD*, SMAD4*, SMARCA4*, SMARCB1*, SMARCE1*, STK11*, SUFU*, TMEM127*, TP53*, TSC1*, TSC2*, VHL*. RNA analysis is performed for * genes.   The test report has been scanned into EPIC and is located under the Molecular Pathology  section of the Results Review tab.  A portion of the result report is included below for reference. Genetic testing reported out on 10/17/2022.   Genetic testing identified a variant of uncertain significance (VUS) in the CDK4 gene called c.119C>T.  At this time, it is unknown if this variant is associated with an increased risk for cancer or if it is benign, but most uncertain variants are reclassified to benign. It should not be used to make medical management decisions. With time, we suspect the  laboratory will determine the significance of this variant, if any. If the laboratory reclassifies this variant, we will attempt to contact [name] to discuss it further.   Even though a pathogenic variant was not identified, possible explanations for the cancer in the family may include: There may be no hereditary risk for cancer in the family. The cancers in [name] and/or his family may be sporadic/familial or due to other genetic and environmental factors. There may be a gene mutation in one of these genes that current testing methods cannot detect but that chance is small. There could be another gene that has not yet been discovered, or that we have not yet tested, that is responsible for the cancer diagnoses in the family.  It is also possible there is a hereditary cause for the cancer in the family that [name] did not inherit.  Therefore, it is important to remain in touch with cancer genetics in the future so that we can continue to offer [name] the most up to date genetic testing.   ADDITIONAL GENETIC TESTING:  We discussed with [name] that his genetic testing was fairly extensive.  If there are additional relevant genes identified to increase cancer risk that can be analyzed in the future, we would be happy to discuss and coordinate this testing at that time.    CANCER SCREENING RECOMMENDATIONS:  [name] test result is considered negative (normal).  This means that we have not identified a hereditary cause for his family history of cancer at this time.   An individual's cancer risk and medical management are not determined by genetic test results alone. Overall cancer risk assessment incorporates additional factors, including personal medical history, family history, and any available genetic information that may result in a personalized plan for cancer prevention and surveillance. Therefore, it is recommended he continue to follow the cancer management and screening guidelines  provided by his  primary healthcare provider.  RECOMMENDATIONS FOR FAMILY MEMBERS:   Since he did not inherit a identifiable mutation in a cancer predisposition gene included on this panel, his children could not have inherited a known mutation from him in one of these genes. Individuals in this family might be at some increased risk of developing cancer, over the general population risk, due to the family history of cancer.  Individuals in the family should notify their providers of the family history of cancer. We recommend women in this family have a yearly mammogram beginning at age 2, or 67 years younger than the earliest onset of cancer, an annual clinical breast exam, and perform monthly breast self-exams.  Family members should have colonoscopies by at age 22, or earlier, as recommended by their providers. Other members of the family may still carry a pathogenic variant in one of these genes that [name] did not inherit. Based on the family history, we recommend his paternal relatives, especially those who have had cancer, have genetic counseling and testing. [name] will let  us know if we can be of any assistance in coordinating genetic counseling and/or testing for this family member.   We do not recommend familial testing for the CDK4 variant of uncertain significance (VUS).  FOLLOW-UP:  Lastly, we discussed with [name] that cancer genetics is a rapidly advancing field and it is possible that new genetic tests will be appropriate for him and/or his family members in the future. We encouraged him to remain in contact with cancer genetics on an annual basis so we can update his personal and family histories and let him know of advances in cancer genetics that may benefit this family.   Our contact number was provided. [name] questions were answered to his satisfaction, and he knows he is welcome to call us at anytime with additional questions or concerns.    Faith Rogue, MS,  [facility] Genetic Counselor [email or web address] Phone: [phone]

## 2022-11-11 ENCOUNTER — Encounter: Payer: Self-pay | Admitting: Internal Medicine

## 2022-11-11 DIAGNOSIS — J019 Acute sinusitis, unspecified: Secondary | ICD-10-CM | POA: Diagnosis not present

## 2022-12-11 ENCOUNTER — Other Ambulatory Visit: Payer: Self-pay | Admitting: Internal Medicine

## 2022-12-11 NOTE — Telephone Encounter (Signed)
LOV: 09/13/12 NOV: 02/14/23

## 2023-01-28 ENCOUNTER — Other Ambulatory Visit: Payer: Self-pay | Admitting: Internal Medicine

## 2023-01-28 DIAGNOSIS — E1165 Type 2 diabetes mellitus with hyperglycemia: Secondary | ICD-10-CM

## 2023-01-31 ENCOUNTER — Ambulatory Visit: Payer: BC Managed Care – PPO | Admitting: Internal Medicine

## 2023-02-14 ENCOUNTER — Other Ambulatory Visit: Payer: Self-pay | Admitting: Internal Medicine

## 2023-02-14 ENCOUNTER — Ambulatory Visit: Payer: BC Managed Care – PPO | Admitting: Internal Medicine

## 2023-02-14 ENCOUNTER — Encounter: Payer: Self-pay | Admitting: Internal Medicine

## 2023-02-14 VITALS — BP 120/70 | HR 67 | Temp 98.0°F | Resp 17 | Ht 70.0 in | Wt 141.5 lb

## 2023-02-14 DIAGNOSIS — E1165 Type 2 diabetes mellitus with hyperglycemia: Secondary | ICD-10-CM

## 2023-02-14 DIAGNOSIS — E1121 Type 2 diabetes mellitus with diabetic nephropathy: Secondary | ICD-10-CM | POA: Diagnosis not present

## 2023-02-14 DIAGNOSIS — E785 Hyperlipidemia, unspecified: Secondary | ICD-10-CM

## 2023-02-14 DIAGNOSIS — Z7985 Long-term (current) use of injectable non-insulin antidiabetic drugs: Secondary | ICD-10-CM

## 2023-02-14 DIAGNOSIS — E1169 Type 2 diabetes mellitus with other specified complication: Secondary | ICD-10-CM

## 2023-02-14 DIAGNOSIS — K2960 Other gastritis without bleeding: Secondary | ICD-10-CM

## 2023-02-14 DIAGNOSIS — T39395A Adverse effect of other nonsteroidal anti-inflammatory drugs [NSAID], initial encounter: Secondary | ICD-10-CM

## 2023-02-14 LAB — LIPID PANEL
Cholesterol: 131 mg/dL (ref 0–200)
HDL: 46.5 mg/dL (ref >39.00)
LDL Cholesterol: 62 mg/dL (ref 0–99)
NonHDL: 84.31
Total CHOL/HDL Ratio: 3
Triglycerides: 113 mg/dL (ref 0.0–149.0)
VLDL: 22.6 mg/dL (ref 0.0–40.0)

## 2023-02-14 LAB — LDL CHOLESTEROL, DIRECT: Direct LDL: 68 mg/dL

## 2023-02-14 LAB — COMPREHENSIVE METABOLIC PANEL
ALT: 32 U/L (ref 0–53)
AST: 22 U/L (ref 0–37)
Albumin: 4.8 g/dL (ref 3.5–5.2)
Alkaline Phosphatase: 61 U/L (ref 39–117)
BUN: 15 mg/dL (ref 6–23)
CO2: 26 mEq/L (ref 19–32)
Calcium: 9.9 mg/dL (ref 8.4–10.5)
Chloride: 103 mEq/L (ref 96–112)
Creatinine, Ser: 0.65 mg/dL (ref 0.40–1.50)
GFR: 112.39 mL/min (ref >60.00)
Glucose, Bld: 110 mg/dL — ABNORMAL HIGH (ref 70–99)
Potassium: 4.1 mEq/L (ref 3.5–5.1)
Sodium: 137 mEq/L (ref 135–145)
Total Bilirubin: 0.5 mg/dL (ref 0.2–1.2)
Total Protein: 7.3 g/dL (ref 6.0–8.3)

## 2023-02-14 LAB — HEMOGLOBIN A1C: Hgb A1c MFr Bld: 6.6 % — ABNORMAL HIGH (ref 4.6–6.5)

## 2023-02-14 MED ORDER — METFORMIN HCL ER 750 MG PO TB24: 1500.0000 mg | ORAL_TABLET | Freq: Every day | ORAL | 0 refills | Status: DC

## 2023-02-14 MED ORDER — ATORVASTATIN CALCIUM 20 MG PO TABS: 20.0000 mg | ORAL_TABLET | Freq: Every day | ORAL | 0 refills | Status: DC

## 2023-02-14 MED ORDER — GLIPIZIDE ER 10 MG PO TB24: 10.0000 mg | ORAL_TABLET | Freq: Every day | ORAL | 1 refills | Status: DC

## 2023-02-14 MED ORDER — TIRZEPATIDE 5 MG/0.5ML ~~LOC~~ SOAJ: 5.0000 mg | SUBCUTANEOUS | 1 refills | Status: DC

## 2023-02-14 NOTE — Assessment & Plan Note (Signed)
Weight loss has  stopped with d/c metformin .  He is taking 7.5 mg dose of mounjaro for improved  glucose control

## 2023-02-14 NOTE — Assessment & Plan Note (Signed)
He has a history of mild gastritis without ulcer during prior use of NSAIDs. He is currently using Celebrex , but it is no longer covered  by insurance

## 2023-02-14 NOTE — Patient Instructions (Addendum)
FINISH THE CURRENT MOUNJARO DOSE OF 7.5 MG   FINISH CURRENT METFORMIN     NEXT MOUNJAR0  DOSE WILL BE 5 MG (ON FILE AT PHARMACY)  WHEN YOU START THAT,  ALSO START THE GLIPIZIDE XL ONCE DAILY  IN THE MORNING  THE REFILL  ON THE METFORMIN WILL ALSO BE  HIGHER"   750 MG START WITH ONE DAILY ,  NOT 2     EVENTUALLY  YOUR REGIMEN  WILL BE   MOUNJARO 5  MG   METFORMIN 1500  MG   GLIPIZIDE 10  MG   IF LOW BS OCCUR,  REDUCE GLIPIZIDE XL TO 1/2 TABLET

## 2023-02-14 NOTE — Progress Notes (Unsigned)
Subjective:  Patient ID: Andrew Molina, male    DOB: 1975/11/15  Age: 47 y.o. MRN: [id number]  CC: The primary encounter diagnosis was Hyperlipidemia associated with type 2 diabetes mellitus (HCC). 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   HPI Andrew Molina presents for  Chief Complaint  Patient presents with   Medical Management of Chronic Issues    f/u    T2DM He feels generally well, is exercising several times per week and checking blood sugars once daily at variable times.  Denies any recent hypoglyemic events.  Taking his medications as directed .  He is following a carbohydrate modified diet about 75% of the time .  He denies numbness, burning and tingling of extremities. Appetite is diminished by use of metformin and Ozempic. He is currently taking metformin once daily 500 mg dose,  mounjaro 7.5 mg   weekly and  farxiga 10 mg .    HE has lost an excessive amount of weight  and tried stopping THE METFORMIN FOR A FEW WEEKS,. AND DID NOT GAIN WEIGHT BUT noted an elevation in BS in the morning, so he increased the dose to 10000 mg daily  and tried adding 5 mg glipizde in the morning with no improvement .  He has gained two lbs since last visit.    Outpatient Medications Prior to Visit  Medication Sig Dispense Refill   ALPRAZolam (XANAX) 1 MG tablet TAKE 1 TABLET BY MOUTH ONCE DAILY AS NEEDED FOR SLEEP 30 tablet 5   atorvastatin (LIPITOR) 20 MG tablet Take 1 tablet by mouth once daily 90 tablet 0   dapagliflozin propanediol (FARXIGA) 10 MG TABS tablet Take 1 tablet (10 mg total) by mouth daily before breakfast. 90 tablet 2   glucose blood test strip Check sugar twice daily. One Touch Ultra  strips. Dx: E11.65 200 each 5   metFORMIN (GLUCOPHAGE) 1000 MG tablet Take 1 tablet (1,000 mg total) by mouth 2 (two) times daily. (Patient taking differently: Take 1,000 mg by mouth daily with breakfast.) 180 tablet 0   naproxen sodium (ALEVE) 220 MG tablet Take 220 mg by mouth daily as needed.     tirzepatide (MOUNJARO) 7.5 MG/0.5ML Pen INJECT 7.5MG  SUBCUTANEOUSLY ONCE A WEEK. 12 mL 1   celecoxib (CELEBREX) 200 MG capsule Take 1 capsule (200 mg total) by mouth 2 (two) times daily. (Patient not taking: Reported on 02/14/2023) 28 capsule 0   Continuous Blood Gluc Sensor (FREESTYLE LIBRE 3 SENSOR) MISC Apply 1 each topically daily. Place 1 sensor on the skin every 14 days. Use to check glucose continuously (Patient not taking: Reported on 02/14/2023) 2 each 11   No facility-administered medications prior to visit.    Review of Systems;  Patient denies headache, fevers, malaise, unintentional weight loss, skin rash, eye pain, sinus congestion and sinus pain, sore throat, dysphagia,  hemoptysis , cough, dyspnea, wheezing, chest pain, palpitations, orthopnea, edema, abdominal pain, nausea, melena, diarrhea, constipation, flank pain, dysuria, hematuria, urinary  Frequency, nocturia, numbness, tingling, seizures,  Focal weakness, Loss of consciousness,  Tremor, insomnia, depression, anxiety, and suicidal ideation.      Objective:  BP 120/70   Pulse 67   Temp 98 F (36.7 C) (Oral)   Resp 17   Ht 5\' 10"  (1.778 m)  Wt 141 lb 8 oz (64.2 kg)   SpO2 98%   BMI 20.30 kg/m   BP Readings from Last 3 Encounters:  02/14/23 120/70  09/13/22 110/70  03/27/22 122/76    Wt Readings from Last 3 Encounters:  02/14/23 141 lb 8 oz (64.2 kg)  09/13/22 139 lb 3.2 oz (63.1 kg)  03/27/22 140 lb 3.2 oz (63.6 kg)    Physical Exam Vitals reviewed.  Constitutional:      General: He is not in acute distress.    Appearance: Normal appearance. He is normal weight. He is not ill-appearing, toxic-appearing or  diaphoretic.  HENT:     Head: Normocephalic.  Eyes:     General: No scleral icterus.       Right eye: No discharge.        Left eye: No discharge.     Conjunctiva/sclera: Conjunctivae normal.  Cardiovascular:     Rate and Rhythm: Normal rate and regular rhythm.     Heart sounds: Normal heart sounds.  Pulmonary:     Effort: Pulmonary effort is normal. No respiratory distress.     Breath sounds: Normal breath sounds.  Musculoskeletal:        General: Normal range of motion.     Cervical back: Normal range of motion.  Skin:    General: Skin is warm and dry.  Neurological:     General: No focal deficit present.     Mental Status: He is alert and oriented to person, place, and time. Mental status is at baseline.  Psychiatric:        Mood and Affect: Mood normal.        Behavior: Behavior normal.        Thought Content: Thought content normal.        Judgment: Judgment normal.     Lab Results  Component Value Date   HGBA1C 6.6 (H) 02/14/2023   HGBA1C 6.0 09/27/2022   HGBA1C 6.8 (H) 06/21/2022    Lab Results  Component Value Date   CREATININE 0.65 02/14/2023   CREATININE 0.69 09/27/2022   CREATININE 0.63 06/21/2022    Lab Results  Component Value Date   WBC 6.6 02/27/2016   HGB 13.4 02/27/2016   HCT 39.1 02/27/2016   PLT 250.0 02/27/2016   GLUCOSE 110 (H) 02/14/2023   CHOL 131 02/14/2023   TRIG 113.0 02/14/2023   HDL 46.50 02/14/2023   LDLDIRECT 68.0 02/14/2023   LDLCALC 62 02/14/2023   ALT 32 02/14/2023   AST 22 02/14/2023   NA 137 02/14/2023   K 4.1 02/14/2023   CL 103 02/14/2023   CREATININE 0.65 02/14/2023   BUN 15 02/14/2023   CO2 26 02/14/2023   TSH 1.60 06/21/2022   PSA 0.60 09/27/2022   HGBA1C 6.6 (H) 02/14/2023   MICROALBUR 3.4 (H) 09/27/2022    DG Facial Bones 1-2 Views  Result Date: 02/18/2019 CLINICAL DATA:  Foreign body post injury 1.5 months ago. EXAM: FACIAL BONES - 1-2 VIEW COMPARISON:  None. FINDINGS: There is no evidence of fracture  or other significant bone abnormality. No orbital emphysema or sinus air-fluid levels are seen. There is a 6 mm metallic fragment within the soft tissues overlying the left zygomatic arch. IMPRESSION: 6 mm metallic foreign body within the soft tissues overlying the left zygomatic arch. Electronically Signed   By: Ted Mcalpine M.D.   On: 02/18/2019 14:33    Assessment & Plan:  .Hyperlipidemia associated with type 2 diabetes mellitus (HCC) Assessment & Plan: LDL  is at goal and LFTs are normal. Continue atorvastatin 20 mg daily   Lab Results  Component Value Date   CHOL 131 02/14/2023   HDL 46.50 02/14/2023   LDLCALC 62 02/14/2023   LDLDIRECT 68.0 02/14/2023   TRIG 113.0 02/14/2023   CHOLHDL 3 02/14/2023     Orders: -     Lipid panel -     LDL cholesterol, direct  Type 2 diabetes mellitus with hyperglycemia, without long-term current use of insulin (HCC) -     Atorvastatin Calcium; Take 1 tablet (20 mg total) by mouth daily.  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 He is currently using Celebrex , but it is no longer covered  by insurance    Diabetes mellitus with microalbuminuric diabetic nephropathy (HCC) Assessment & Plan: Weight loss has  been excessive on current mounjaro dose but patient notes hyperglycemia at lower dose (5 mg) .and with extended interval.     Will stop metformin to enable some weight gain,  and add glipizide if hyperglycemia occurs Weight loss has  stopped with d/c metformin .  He is taking 7.5 mg dose of mounjaro for improved  glucose control .  Will reduce dose to 5 mg   increase and change metformin to XR with starting dose 750 mg  and add glipizide xl  10 mg daily   Continue farxiga .   ARB was suspended due to recurrent hypotension despite the lowest dose  of losartan   Lab Results  Component Value Date   HGBA1C 6.6 (H) 02/14/2023   Lab Results  Component Value Date   MICROALBUR 3.4 (H) 09/27/2022   MICROALBUR 15.3 (H) 10/08/2021        Long-term current use of injectable noninsulin antidiabetic medication Assessment & Plan: He has been using GLP 1 agonist which has resulted on excessive weight loss and excellent glycemic control   Other orders -     metFORMIN HCl ER; Take 2 tablets (1,500 mg total) by mouth daily with breakfast.  Dispense: 180 tablet; Refill: 0 -     Tirzepatide; Inject 5 mg into the skin once a week.  Dispense: 6 mL; Refill: 1 -     glipiZIDE ER; Take 1 tablet (10 mg total) by mouth daily with breakfast.  Dispense: 90 tablet; Refill: 1    Follow-up: Return in about 3 months (around 05/17/2023) for follow up diabetes.   Sherlene Shams, MD

## 2023-02-15 DIAGNOSIS — Z7985 Long-term (current) use of injectable non-insulin antidiabetic drugs: Secondary | ICD-10-CM | POA: Insufficient documentation

## 2023-02-15 NOTE — Assessment & Plan Note (Signed)
LDL is at goal and LFTs are normal. Continue atorvastatin 20 mg daily   Lab Results  Component Value Date   CHOL 131 02/14/2023   HDL 46.50 02/14/2023   LDLCALC 62 02/14/2023   LDLDIRECT 68.0 02/14/2023   TRIG 113.0 02/14/2023   CHOLHDL 3 02/14/2023

## 2023-02-15 NOTE — Assessment & Plan Note (Signed)
He has been using GLP 1 agonist which has resulted on excessive weight loss and excellent glycemic control

## 2023-04-18 ENCOUNTER — Other Ambulatory Visit: Payer: Self-pay | Admitting: Internal Medicine

## 2023-04-18 DIAGNOSIS — E1165 Type 2 diabetes mellitus with hyperglycemia: Secondary | ICD-10-CM

## 2023-04-19 ENCOUNTER — Encounter: Payer: Self-pay | Admitting: Internal Medicine

## 2023-04-19 DIAGNOSIS — E1165 Type 2 diabetes mellitus with hyperglycemia: Secondary | ICD-10-CM

## 2023-04-21 MED ORDER — DAPAGLIFLOZIN PROPANEDIOL 10 MG PO TABS
10.0000 mg | ORAL_TABLET | Freq: Every day | ORAL | 2 refills | Status: DC
Start: 2023-04-21 — End: 2023-04-22

## 2023-04-22 MED ORDER — DAPAGLIFLOZIN PROPANEDIOL 10 MG PO TABS
10.0000 mg | ORAL_TABLET | Freq: Every day | ORAL | 3 refills | Status: DC
Start: 2023-04-22 — End: 2024-04-27

## 2023-04-22 NOTE — Addendum Note (Signed)
Addended by: Kristie Cowman on: 04/22/2023 04:03 PM   Modules accepted: Orders

## 2023-05-23 ENCOUNTER — Ambulatory Visit: Payer: BC Managed Care – PPO | Admitting: Internal Medicine

## 2023-06-19 ENCOUNTER — Encounter: Payer: Self-pay | Admitting: Internal Medicine

## 2023-06-19 NOTE — Telephone Encounter (Signed)
Care team updated and letter sent for eye exam notes.

## 2023-07-18 ENCOUNTER — Ambulatory Visit (INDEPENDENT_AMBULATORY_CARE_PROVIDER_SITE_OTHER): Payer: BC Managed Care – PPO

## 2023-07-18 ENCOUNTER — Ambulatory Visit: Payer: BC Managed Care – PPO | Admitting: Internal Medicine

## 2023-07-18 ENCOUNTER — Encounter: Payer: Self-pay | Admitting: Internal Medicine

## 2023-07-18 VITALS — BP 136/86 | HR 71 | Ht 70.0 in | Wt 151.0 lb

## 2023-07-18 DIAGNOSIS — Z87891 Personal history of nicotine dependence: Secondary | ICD-10-CM | POA: Diagnosis not present

## 2023-07-18 DIAGNOSIS — E1169 Type 2 diabetes mellitus with other specified complication: Secondary | ICD-10-CM | POA: Diagnosis not present

## 2023-07-18 DIAGNOSIS — R059 Cough, unspecified: Secondary | ICD-10-CM | POA: Diagnosis not present

## 2023-07-18 DIAGNOSIS — E1121 Type 2 diabetes mellitus with diabetic nephropathy: Secondary | ICD-10-CM

## 2023-07-18 DIAGNOSIS — E1165 Type 2 diabetes mellitus with hyperglycemia: Secondary | ICD-10-CM | POA: Diagnosis not present

## 2023-07-18 DIAGNOSIS — Z7985 Long-term (current) use of injectable non-insulin antidiabetic drugs: Secondary | ICD-10-CM

## 2023-07-18 DIAGNOSIS — E785 Hyperlipidemia, unspecified: Secondary | ICD-10-CM

## 2023-07-18 DIAGNOSIS — Z79899 Other long term (current) drug therapy: Secondary | ICD-10-CM

## 2023-07-18 DIAGNOSIS — Z7984 Long term (current) use of oral hypoglycemic drugs: Secondary | ICD-10-CM

## 2023-07-18 LAB — COMPREHENSIVE METABOLIC PANEL
ALT: 45 U/L (ref 0–53)
AST: 32 U/L (ref 0–37)
Albumin: 4.9 g/dL (ref 3.5–5.2)
Alkaline Phosphatase: 65 U/L (ref 39–117)
BUN: 19 mg/dL (ref 6–23)
CO2: 27 meq/L (ref 19–32)
Calcium: 9.6 mg/dL (ref 8.4–10.5)
Chloride: 102 meq/L (ref 96–112)
Creatinine, Ser: 0.65 mg/dL (ref 0.40–1.50)
GFR: 112.05 mL/min (ref >60.00)
Glucose, Bld: 79 mg/dL (ref 70–99)
Potassium: 3.9 meq/L (ref 3.5–5.1)
Sodium: 140 meq/L (ref 135–145)
Total Bilirubin: 0.6 mg/dL (ref 0.2–1.2)
Total Protein: 7.4 g/dL (ref 6.0–8.3)

## 2023-07-18 LAB — LIPID PANEL
Cholesterol: 134 mg/dL (ref 0–200)
HDL: 58.5 mg/dL (ref >39.00)
LDL Cholesterol: 62 mg/dL (ref 0–99)
NonHDL: 75.52
Total CHOL/HDL Ratio: 2
Triglycerides: 68 mg/dL (ref 0.0–149.0)
VLDL: 13.6 mg/dL (ref 0.0–40.0)

## 2023-07-18 LAB — LDL CHOLESTEROL, DIRECT: Direct LDL: 64 mg/dL

## 2023-07-18 LAB — HEMOGLOBIN A1C: Hgb A1c MFr Bld: 6.1 % (ref 4.6–6.5)

## 2023-07-18 LAB — LIPASE: Lipase: 344 U/L — ABNORMAL HIGH (ref 11.0–59.0)

## 2023-07-18 MED ORDER — METFORMIN HCL ER 750 MG PO TB24: 750.0000 mg | ORAL_TABLET | Freq: Every day | ORAL | 0 refills | Status: DC

## 2023-07-18 MED ORDER — GLIPIZIDE ER 5 MG PO TB24: 5.0000 mg | ORAL_TABLET | Freq: Every day | ORAL | 0 refills | Status: DC

## 2023-07-18 MED ORDER — METFORMIN HCL ER 750 MG PO TB24: 1500.0000 mg | ORAL_TABLET | Freq: Every day | ORAL | 3 refills | Status: DC

## 2023-07-18 MED ORDER — ATORVASTATIN CALCIUM 20 MG PO TABS
20.0000 mg | ORAL_TABLET | Freq: Every day | ORAL | 3 refills | Status: DC
Start: 2023-07-18 — End: 2024-02-15

## 2023-07-18 MED ORDER — TIRZEPATIDE 5 MG/0.5ML ~~LOC~~ SOAJ: 5.0000 mg | SUBCUTANEOUS | 3 refills | Status: DC

## 2023-07-18 NOTE — Progress Notes (Unsigned)
Subjective:  Patient ID: Andrew Molina, male    DOB: 11-Dec-1975  Age: 47 y.o. MRN: [id number]  CC: The primary encounter diagnosis was Hyperlipidemia associated with type 2 diabetes mellitus (HCC). Diagnoses of Type 2 diabetes mellitus with hyperglycemia, without long-term current use of insulin (HCC), Long-term use of high-risk medication, Cough in adult patient, and Diabetes mellitus with microalbuminuric diabetic nephropathy (HCC) were also pertinent to this visit.   HPI Andrew Molina presents for  Chief Complaint  Patient presents with   Medical Management of Chronic Issues    3 month follow up    1) Type 2 DM:  feeling ok ,  has regained 9 lbs using lower dose of  mounjaro 5 mg,  750 mg metformin, and 10 mg glipizide XL.  Has occasional episodes of hypoglycemia symptoms ([place] Headed ,  presyncopal ) not occurring more than once weekly,  since resuming glipizide .  Feeling shaky currently, had 2 atkins bars this morning at 6 am ( 10:30 now) . Takes glipizide in the morning   CBG is 78  In office.    [name]   2) Cough x 2 weeks :  quit smoking 2 months ago.  Vapes daily " a lot"  no fevers, dyspnea or pleurisy  3) elevated BP:  meds were stopped at last  visit due to recurrent hypotension. He has not been checking his BP at home.  He takes aleve once daily qam for back pain .    Outpatient Medications Prior to Visit  Medication Sig Dispense Refill   ALPRAZolam (XANAX) 1 MG tablet TAKE 1 TABLET BY MOUTH ONCE DAILY AS NEEDED FOR SLEEP 30 tablet 5   dapagliflozin propanediol (FARXIGA) 10 MG TABS tablet Take 1 tablet (10 mg total) by mouth daily before breakfast. 30 tablet 3   glucose blood test strip Check sugar twice daily. One Touch Ultra strips. Dx: E11.65 200 each 5   naproxen sodium (ALEVE) 220 MG tablet Take 220 mg by mouth daily as needed.     atorvastatin (LIPITOR) 20 MG tablet Take 1 tablet (20 mg total) by mouth daily. 90 tablet 0   glipiZIDE (GLUCOTROL XL) 10 MG 24 hr tablet Take 1  tablet (10 mg total) by mouth daily with breakfast. 90 tablet 1   metFORMIN (GLUCOPHAGE-XR) 750 MG 24 hr tablet Take 2 tablets (1,500 mg total) by mouth daily with breakfast. 180 tablet 0   tirzepatide (MOUNJARO) 5 MG/0.5ML Pen Inject 5 mg into the skin once a week. 6 mL 1   atorvastatin (LIPITOR) 20 MG tablet Take 1 tablet by mouth once daily (Patient not taking: Reported on 07/18/2023) 90 tablet 0   metFORMIN (GLUCOPHAGE) 1000 MG tablet Take 1 tablet (1,000 mg total) by mouth 2 (two) times daily. (Patient not taking: Reported on 07/18/2023) 180 tablet 0   No facility-administered medications prior to visit.    Review of Systems;  Patient denies headache, fevers, malaise, unintentional weight loss, skin rash, eye pain, sinus congestion and sinus pain, sore throat, dysphagia,  hemoptysis , cough, dyspnea, wheezing, chest pain, palpitations, orthopnea, edema, abdominal pain, nausea, melena, diarrhea, constipation, flank pain, dysuria, hematuria, urinary  Frequency, nocturia, numbness, tingling, seizures,  Focal weakness, Loss of consciousness,  Tremor, insomnia, depression, anxiety, and suicidal ideation.      Objective:  BP 136/86   Pulse 71   Ht 5\' 10"  (1.778 m)   Wt 151 lb (68.5 kg)   SpO2 98%   BMI 21.67 kg/m  BP Readings from Last 3 Encounters:  07/18/23 136/86  02/14/23 120/70  09/13/22 110/70    Wt Readings from Last 3 Encounters:  07/18/23 151 lb (68.5 kg)  02/14/23 141 lb 8 oz (64.2 kg)  09/13/22 139 lb 3.2 oz (63.1 kg)    Physical Exam Vitals reviewed.  Constitutional:      General: He is not in acute distress.    Appearance: Normal appearance. He is normal weight. He is not ill-appearing, toxic-appearing or diaphoretic.  HENT:     Head: Normocephalic and atraumatic.     Right Ear: Tympanic membrane, ear canal and external ear normal. There is no impacted cerumen.     Left Ear: Tympanic membrane, ear canal and external ear normal. There is no impacted cerumen.      Nose: Nose normal.     Mouth/Throat:     Mouth: Mucous membranes are moist.     Pharynx: Oropharynx is clear.  Eyes:     General: No scleral icterus.       Right eye: No discharge.        Left eye: No discharge.     Conjunctiva/sclera: Conjunctivae normal.  Neck:     Thyroid: No thyromegaly.     Vascular: No carotid bruit or JVD.  Cardiovascular:     Rate and Rhythm: Normal rate and regular rhythm.     Heart sounds: Normal heart sounds.  Pulmonary:     Effort: Pulmonary effort is normal. No respiratory distress.     Breath sounds: Normal breath sounds.  Abdominal:     General: Bowel sounds are normal.     Palpations: Abdomen is soft. There is no mass.     Tenderness: There is no abdominal tenderness. There is no guarding or rebound.  Musculoskeletal:        General: Normal range of motion.     Cervical back: Normal range of motion and neck supple.  Lymphadenopathy:     Cervical: No cervical adenopathy.  Skin:    General: Skin is warm and dry.  Neurological:     General: No focal deficit present.     Mental Status: He is alert and oriented to person, place, and time. Mental status is at baseline.  Psychiatric:        Mood and Affect: Mood normal.        Behavior: Behavior normal.        Thought Content: Thought content normal.        Judgment: Judgment normal.    Lab Results  Component Value Date   HGBA1C 6.1 07/18/2023   HGBA1C 6.6 (H) 02/14/2023   HGBA1C 6.0 09/27/2022    Lab Results  Component Value Date   CREATININE 0.65 07/18/2023   CREATININE 0.65 02/14/2023   CREATININE 0.69 09/27/2022    Lab Results  Component Value Date   WBC 6.6 02/27/2016   HGB 13.4 02/27/2016   HCT 39.1 02/27/2016   PLT 250.0 02/27/2016   GLUCOSE 79 07/18/2023   CHOL 134 07/18/2023   TRIG 68.0 07/18/2023   HDL 58.50 07/18/2023   LDLDIRECT 64.0 07/18/2023   LDLCALC 62 07/18/2023   ALT 45 07/18/2023   AST 32 07/18/2023   NA 140 07/18/2023   K 3.9 07/18/2023   CL 102  07/18/2023   CREATININE 0.65 07/18/2023   BUN 19 07/18/2023   CO2 27 07/18/2023   TSH 1.60 06/21/2022   PSA 0.60 09/27/2022   HGBA1C 6.1 07/18/2023   MICROALBUR 3.4 (H) 09/27/2022  Assessment & Plan:  .Hyperlipidemia associated with type 2 diabetes mellitus (HCC) Assessment & Plan: LDL is at goal and LFTs are normal. Continue atorvastatin 20 mg daily   Lab Results  Component Value Date   CHOL 134 07/18/2023   HDL 58.50 07/18/2023   LDLCALC 62 07/18/2023   LDLDIRECT 64.0 07/18/2023   TRIG 68.0 07/18/2023   CHOLHDL 2 07/18/2023     Orders: -     Lipid panel -     LDL cholesterol, direct  Type 2 diabetes mellitus with hyperglycemia, without long-term current use of insulin (HCC) -     Atorvastatin Calcium; Take 1 tablet (20 mg total) by mouth daily.  Dispense: 90 tablet; Refill: 3 -     Comprehensive metabolic panel -     Hemoglobin A1c -     Telmisartan; Take 1 tablet (20 mg total) by mouth daily.  Dispense: 90 tablet; Refill: 1  Long-term use of high-risk medication -     Lipase  Cough in adult patient -     DG Chest 2 View; Future  Diabetes mellitus with microalbuminuric diabetic nephropathy (HCC) Assessment & Plan: Stopping mounjaro due to elevated lipase.  Continue metformin.  Glipizide dose reduced due to hypoglycemia.  Will advise patient to resume ARB for management of proteinuria given elevated reading today   Lab Results  Component Value Date   HGBA1C 6.1 07/18/2023   Lab Results  Component Value Date   MICROALBUR 3.4 (H) 09/27/2022   MICROALBUR 15.3 (H) 10/08/2021        Other orders -     glipiZIDE ER; Take 1 tablet (5 mg total) by mouth daily with breakfast.  Dispense: 90 tablet; Refill: 0 -     metFORMIN HCl ER; Take 1 tablet (750 mg total) by mouth daily with breakfast.  Dispense: 90 tablet; Refill: 0     Follow-up: Return in about 3 months (around 10/17/2023) for follow up diabetes.   Sherlene Shams, MD

## 2023-07-18 NOTE — Patient Instructions (Addendum)
BP is elevated and may be due to aleve use  Check BP daily ,   AND on 1 or 2 days,  take your  BP reading BEFORE yuou take  your usual aleve dose (  you can use 1000 mg tylenol instead )    Stop the glipzide for now.  Continue 5 mg mounjaro  and 750 mg metformin

## 2023-07-20 ENCOUNTER — Other Ambulatory Visit: Payer: Self-pay | Admitting: Internal Medicine

## 2023-07-20 ENCOUNTER — Encounter: Payer: Self-pay | Admitting: Internal Medicine

## 2023-07-20 MED ORDER — TELMISARTAN 20 MG PO TABS: 20.0000 mg | ORAL_TABLET | Freq: Every day | ORAL | 1 refills | Status: DC

## 2023-07-20 NOTE — Assessment & Plan Note (Signed)
LDL is at goal and LFTs are normal. Continue atorvastatin 20 mg daily   Lab Results  Component Value Date   CHOL 134 07/18/2023   HDL 58.50 07/18/2023   LDLCALC 62 07/18/2023   LDLDIRECT 64.0 07/18/2023   TRIG 68.0 07/18/2023   CHOLHDL 2 07/18/2023

## 2023-07-20 NOTE — Assessment & Plan Note (Signed)
Stopping mounjaro due to elevated lipase.  Continue metformin.  Glipizide dose reduced due to hypoglycemia.  Will advise patient to resume ARB for management of proteinuria given elevated reading today   Lab Results  Component Value Date   HGBA1C 6.1 07/18/2023   Lab Results  Component Value Date   MICROALBUR 3.4 (H) 09/27/2022   MICROALBUR 15.3 (H) 10/08/2021

## 2023-07-25 ENCOUNTER — Encounter: Payer: Self-pay | Admitting: Internal Medicine

## 2023-08-07 ENCOUNTER — Other Ambulatory Visit: Payer: Self-pay | Admitting: Internal Medicine

## 2023-08-30 ENCOUNTER — Encounter: Payer: Self-pay | Admitting: Internal Medicine

## 2023-09-01 ENCOUNTER — Encounter: Payer: Self-pay | Admitting: Internal Medicine

## 2023-09-01 ENCOUNTER — Other Ambulatory Visit: Payer: Self-pay | Admitting: Internal Medicine

## 2023-09-01 DIAGNOSIS — Z7985 Long-term (current) use of injectable non-insulin antidiabetic drugs: Secondary | ICD-10-CM

## 2023-09-01 DIAGNOSIS — E1121 Type 2 diabetes mellitus with diabetic nephropathy: Secondary | ICD-10-CM

## 2023-09-01 NOTE — Assessment & Plan Note (Signed)
Andrew Molina has been discontinued

## 2023-09-01 NOTE — Assessment & Plan Note (Signed)
Advised to start glipzidie XL 1/2 tablet (2.5 mg ) daily for elevated readings noted since stopping [employer]

## 2023-10-17 ENCOUNTER — Ambulatory Visit: Payer: BC Managed Care – PPO | Admitting: Internal Medicine

## 2023-10-17 ENCOUNTER — Encounter: Payer: Self-pay | Admitting: Internal Medicine

## 2023-10-17 VITALS — BP 130/84 | HR 79 | Ht 70.0 in | Wt 157.8 lb

## 2023-10-17 DIAGNOSIS — E1121 Type 2 diabetes mellitus with diabetic nephropathy: Secondary | ICD-10-CM | POA: Diagnosis not present

## 2023-10-17 DIAGNOSIS — Z7984 Long term (current) use of oral hypoglycemic drugs: Secondary | ICD-10-CM

## 2023-10-17 DIAGNOSIS — R748 Abnormal levels of other serum enzymes: Secondary | ICD-10-CM | POA: Diagnosis not present

## 2023-10-17 DIAGNOSIS — Z79899 Other long term (current) drug therapy: Secondary | ICD-10-CM

## 2023-10-17 DIAGNOSIS — E785 Hyperlipidemia, unspecified: Secondary | ICD-10-CM

## 2023-10-17 DIAGNOSIS — Z23 Encounter for immunization: Secondary | ICD-10-CM | POA: Diagnosis not present

## 2023-10-17 DIAGNOSIS — E1169 Type 2 diabetes mellitus with other specified complication: Secondary | ICD-10-CM | POA: Diagnosis not present

## 2023-10-17 DIAGNOSIS — E1165 Type 2 diabetes mellitus with hyperglycemia: Secondary | ICD-10-CM

## 2023-10-17 LAB — CBC WITH DIFFERENTIAL/PLATELET
Basophils Absolute: 0.1 10*3/uL (ref 0.0–0.1)
Basophils Relative: 1 % (ref 0.0–3.0)
Eosinophils Absolute: 0.4 10*3/uL (ref 0.0–0.7)
Eosinophils Relative: 6.2 % — ABNORMAL HIGH (ref 0.0–5.0)
HCT: 49.9 % (ref 39.0–52.0)
Hemoglobin: 17.1 g/dL — ABNORMAL HIGH (ref 13.0–17.0)
Lymphocytes Relative: 20.8 % (ref 12.0–46.0)
Lymphs Abs: 1.3 10*3/uL (ref 0.7–4.0)
MCHC: 34.3 g/dL (ref 30.0–36.0)
MCV: 93.5 fl (ref 78.0–100.0)
Monocytes Absolute: 0.5 10*3/uL (ref 0.1–1.0)
Monocytes Relative: 8.7 % (ref 3.0–12.0)
Neutro Abs: 3.9 10*3/uL (ref 1.4–7.7)
Neutrophils Relative %: 63.3 % (ref 43.0–77.0)
Platelets: 220 10*3/uL (ref 150.0–400.0)
RBC: 5.33 Mil/uL (ref 4.22–5.81)
RDW: 13.1 % (ref 11.5–15.5)
WBC: 6.2 10*3/uL (ref 4.0–10.5)

## 2023-10-17 LAB — COMPREHENSIVE METABOLIC PANEL
ALT: 40 U/L (ref 0–53)
AST: 23 U/L (ref 0–37)
Albumin: 5.1 g/dL (ref 3.5–5.2)
Alkaline Phosphatase: 63 U/L (ref 39–117)
BUN: 16 mg/dL (ref 6–23)
CO2: 27 meq/L (ref 19–32)
Calcium: 10 mg/dL (ref 8.4–10.5)
Chloride: 100 meq/L (ref 96–112)
Creatinine, Ser: 0.68 mg/dL (ref 0.40–1.50)
GFR: 110.34 mL/min (ref >60.00)
Glucose, Bld: 221 mg/dL — ABNORMAL HIGH (ref 70–99)
Potassium: 4.5 meq/L (ref 3.5–5.1)
Sodium: 137 meq/L (ref 135–145)
Total Bilirubin: 0.7 mg/dL (ref 0.2–1.2)
Total Protein: 7.2 g/dL (ref 6.0–8.3)

## 2023-10-17 LAB — MICROALBUMIN / CREATININE URINE RATIO
Creatinine,U: 24.6 mg/dL
Microalb Creat Ratio: 193.2 mg/g — ABNORMAL HIGH (ref 0.0–30.0)
Microalb, Ur: 4.8 mg/dL — ABNORMAL HIGH (ref 0.0–1.9)

## 2023-10-17 LAB — LIPASE: Lipase: 101 U/L — ABNORMAL HIGH (ref 11.0–59.0)

## 2023-10-17 LAB — LIPID PANEL
Cholesterol: 179 mg/dL (ref 0–200)
HDL: 54.7 mg/dL (ref >39.00)
LDL Cholesterol: 101 mg/dL — ABNORMAL HIGH (ref 0–99)
NonHDL: 124.7
Total CHOL/HDL Ratio: 3
Triglycerides: 117 mg/dL (ref 0.0–149.0)
VLDL: 23.4 mg/dL (ref 0.0–40.0)

## 2023-10-17 LAB — LDL CHOLESTEROL, DIRECT: Direct LDL: 107 mg/dL

## 2023-10-17 LAB — TSH: TSH: 1.6 u[IU]/mL (ref 0.35–5.50)

## 2023-10-17 LAB — HEMOGLOBIN A1C: Hgb A1c MFr Bld: 9.3 % — ABNORMAL HIGH (ref 4.6–6.5)

## 2023-10-17 NOTE — Progress Notes (Signed)
 Subjective:  Patient ID: Andrew Molina, male    DOB: 03/02/1976  Age: 48 y.o. MRN: [id number]  CC: The primary encounter diagnosis was Hyperlipidemia associated with type 2 diabetes mellitus (HCC). Diagnoses of Diabetes mellitus with microalbuminuric diabetic nephropathy (HCC), Long-term use of high-risk medication, Elevated lipase, Need for pneumococcal 20-valent conjugate vaccination, and Type 2 diabetes mellitus with hyperglycemia, without long-term current use of insulin (HCC) were also pertinent to this visit.   HPI Andrew Molina presents for  Chief Complaint  Patient presents with   Medical Management of Chronic Issues    3 month follow up on diabetes    1) T2DM: Advised to start glipzide XL 2.5 mg  daily for elevated readings noted since stopping Mounjaro  in December due to excessive weight loss .He has been taking 5 mg daily .  No lows.  Has regained  17 lbs and his  random sugars 150 .  Highest seen was 220 after eating pizza .  Taking telmisartan and farxiga . Having nocturia x 4 since stopping mounjaro.    Outpatient Medications Prior to Visit  Medication Sig Dispense Refill   ALPRAZolam (XANAX) 1 MG tablet TAKE 1 TABLET BY MOUTH ONCE DAILY AS NEEDED FOR SLEEP 30 tablet 5   atorvastatin (LIPITOR) 20 MG tablet Take 1 tablet (20 mg total) by mouth daily. 90 tablet 3   dapagliflozin propanediol (FARXIGA) 10 MG TABS tablet Take 1 tablet (10 mg total) by mouth daily before breakfast. 30 tablet 3   glucose blood test strip Check sugar twice daily. One Touch Ultra strips. Dx: E11.65 200 each 5   naproxen sodium (ALEVE) 220 MG tablet Take 220 mg by mouth daily as needed.     glipiZIDE (GLUCOTROL XL) 5 MG 24 hr tablet Take 1 tablet (5 mg total) by mouth daily with breakfast. 90 tablet 0   metFORMIN (GLUCOPHAGE-XR) 750 MG 24 hr tablet Take 1 tablet (750 mg total) by mouth daily with breakfast. 90 tablet 0   telmisartan (MICARDIS) 20 MG tablet Take 1 tablet (20 mg total) by mouth daily. 90  tablet 1   No facility-administered medications prior to visit.    Review of Systems;  Patient denies headache, fevers, malaise, unintentional weight loss, skin rash, eye pain, sinus congestion and sinus pain, sore throat, dysphagia,  hemoptysis , cough, dyspnea, wheezing, chest pain, palpitations, orthopnea, edema, abdominal pain, nausea, melena, diarrhea, constipation, flank pain, dysuria, hematuria, urinary  Frequency, nocturia, numbness, tingling, seizures,  Focal weakness, Loss of consciousness,  Tremor, insomnia, depression, anxiety, and suicidal ideation.      Objective:  BP 130/84   Pulse 79   Ht 5\' 10"  (1.778 m)   Wt 157 lb 12.8 oz (71.6 kg)   SpO2 99%   BMI 22.64 kg/m   BP Readings from Last 3 Encounters:  10/17/23 130/84  07/18/23 136/86  02/14/23 120/70    Wt Readings from Last 3 Encounters:  10/17/23 157 lb 12.8 oz (71.6 kg)  07/18/23 151 lb (68.5 kg)  02/14/23 141 lb 8 oz (64.2 kg)    Physical Exam  Lab Results  Component Value Date   HGBA1C 9.3 (H) 10/17/2023   HGBA1C 6.1 07/18/2023   HGBA1C 6.6 (H) 02/14/2023    Lab Results  Component Value Date   CREATININE 0.68 10/17/2023   CREATININE 0.65 07/18/2023   CREATININE 0.65 02/14/2023    Lab Results  Component Value Date   WBC 6.2 10/17/2023   HGB 17.1 (H) 10/17/2023  HCT 49.9 10/17/2023   PLT 220.0 10/17/2023   GLUCOSE 221 (H) 10/17/2023   CHOL 179 10/17/2023   TRIG 117.0 10/17/2023   HDL 54.70 10/17/2023   LDLDIRECT 107.0 10/17/2023   LDLCALC 101 (H) 10/17/2023   ALT 40 10/17/2023   AST 23 10/17/2023   NA 137 10/17/2023   K 4.5 10/17/2023   CL 100 10/17/2023   CREATININE 0.68 10/17/2023   BUN 16 10/17/2023   CO2 27 10/17/2023   TSH 1.60 10/17/2023   PSA 0.60 09/27/2022   HGBA1C 9.3 (H) 10/17/2023   MICROALBUR 4.8 (H) 10/17/2023    DG Facial Bones 1-2 Views Result Date: 02/18/2019 CLINICAL DATA:  Foreign body post injury 1.5 months ago. EXAM: FACIAL BONES - 1-2 VIEW COMPARISON:   None. FINDINGS: There is no evidence of fracture or other significant bone abnormality. No orbital emphysema or sinus air-fluid levels are seen. There is a 6 mm metallic fragment within the soft tissues overlying the left zygomatic arch. IMPRESSION: 6 mm metallic foreign body within the soft tissues overlying the left zygomatic arch. Electronically Signed   By: Ted Mcalpine M.D.   On: 02/18/2019 14:33    Assessment & Plan:  .Hyperlipidemia associated with type 2 diabetes mellitus (HCC) -     Lipid panel -     LDL cholesterol, direct  Diabetes mellitus with microalbuminuric diabetic nephropathy (HCC) Assessment & Plan: Uncontrolled since stopping GLP 1 agonist due to excessive weight loss.  Proteinuria has increased also.  Increase glipizide to 10 mg,  and telmisartan to 40 mg  . Continue max dose Marcelline Deist  return one month to review blood sugars   Lab Results  Component Value Date   HGBA1C 9.3 (H) 10/17/2023   Lab Results  Component Value Date   MICROALBUR 4.8 (H) 10/17/2023   MICROALBUR 3.4 (H) 09/27/2022       Orders: -     Comprehensive metabolic panel -     Hemoglobin A1c -     Microalbumin / creatinine urine ratio  Long-term use of high-risk medication -     TSH -     CBC with Differential/Platelet -     Lipase  Elevated lipase -     Lipase  Need for pneumococcal 20-valent conjugate vaccination -     Pneumococcal conjugate vaccine 20-valent  Type 2 diabetes mellitus with hyperglycemia, without long-term current use of insulin (HCC) -     Telmisartan; Take 1 tablet (40 mg total) by mouth daily.  Dispense: 90 tablet; Refill: 1  Other orders -     glipiZIDE ER; Take 1 tablet (10 mg total) by mouth daily with breakfast.  Dispense: 90 tablet; Refill: 1 -     metFORMIN HCl ER; Take 1 tablet (750 mg total) by mouth daily with breakfast.  Dispense: 90 tablet; Refill: 1    ring and reviewing of  diagnostics and therapeutics with patient  .   Follow-up: Return in  about 3 months (around 01/17/2024) for follow up diabetes.   Sherlene Shams, MD

## 2023-10-17 NOTE — Patient Instructions (Signed)
 Please wear the Free Style Libre monitor for the next 2 weeks so can collect data   WE will increase your glipizide to 10 mg daily if your A1c today  is > 7.0  If your sugars drop < 80 on the higher glipizide dose,  resume 5 mg and LET ME KNOW so I can put aside samples of Rybelsus for you.   If there is still protein in your urine today, I will increase your telmisartan dose to 40 mg daily

## 2023-10-18 ENCOUNTER — Encounter: Payer: Self-pay | Admitting: Internal Medicine

## 2023-10-18 DIAGNOSIS — E1121 Type 2 diabetes mellitus with diabetic nephropathy: Secondary | ICD-10-CM

## 2023-10-18 MED ORDER — TELMISARTAN 40 MG PO TABS: 40.0000 mg | ORAL_TABLET | Freq: Every day | ORAL | 1 refills | Status: DC

## 2023-10-18 MED ORDER — GLIPIZIDE ER 10 MG PO TB24: 10.0000 mg | ORAL_TABLET | Freq: Every day | ORAL | 1 refills | Status: DC

## 2023-10-18 MED ORDER — METFORMIN HCL ER 750 MG PO TB24: 750.0000 mg | ORAL_TABLET | Freq: Every day | ORAL | 1 refills | Status: DC

## 2023-10-18 NOTE — Assessment & Plan Note (Addendum)
 Uncontrolled since stopping GLP 1 agonist due to excessive weight loss.  Proteinuria has increased also.  Increase glipizide to 10 mg,  and telmisartan to 40 mg  . Continue max dose Marcelline Deist  return one month to review blood sugars   Lab Results  Component Value Date   HGBA1C 9.3 (H) 10/17/2023   Lab Results  Component Value Date   MICROALBUR 4.8 (H) 10/17/2023   MICROALBUR 3.4 (H) 09/27/2022

## 2023-10-24 LAB — HM DIABETES EYE EXAM

## 2023-10-24 MED ORDER — LOSARTAN POTASSIUM-HCTZ 100-12.5 MG PO TABS: 1.0000 | ORAL_TABLET | Freq: Every day | ORAL | 1 refills | Status: DC

## 2023-10-24 NOTE — Assessment & Plan Note (Signed)
 Telmisartan too $$$ . . Losartan/hct appears to be lower tier.

## 2023-11-06 ENCOUNTER — Encounter: Payer: Self-pay | Admitting: Internal Medicine

## 2023-11-06 ENCOUNTER — Telehealth: Payer: Self-pay

## 2023-11-06 NOTE — Telephone Encounter (Signed)
 I left a voicemail for patient asking him to please call us to reschedule his appointment with Dr. Duncan Dull on 01/30/2024, as provider will be out of the office.  I also sent a letter to patient via MyChart.  E2C2 - when patient calls back, please reschedule this appointment.

## 2023-11-07 ENCOUNTER — Encounter: Payer: Self-pay | Admitting: Internal Medicine

## 2023-11-07 MED ORDER — FREESTYLE LIBRE 3 SENSOR MISC: 11 refills | Status: AC

## 2024-01-30 ENCOUNTER — Ambulatory Visit: Admitting: Internal Medicine

## 2024-02-13 ENCOUNTER — Ambulatory Visit: Admitting: Internal Medicine

## 2024-02-13 ENCOUNTER — Encounter: Payer: Self-pay | Admitting: Internal Medicine

## 2024-02-13 VITALS — BP 126/68 | HR 98 | Temp 98.3°F | Resp 20 | Ht 70.0 in | Wt 158.4 lb

## 2024-02-13 DIAGNOSIS — E785 Hyperlipidemia, unspecified: Secondary | ICD-10-CM | POA: Diagnosis not present

## 2024-02-13 DIAGNOSIS — E1169 Type 2 diabetes mellitus with other specified complication: Secondary | ICD-10-CM | POA: Diagnosis not present

## 2024-02-13 DIAGNOSIS — E1121 Type 2 diabetes mellitus with diabetic nephropathy: Secondary | ICD-10-CM | POA: Diagnosis not present

## 2024-02-13 DIAGNOSIS — R200 Anesthesia of skin: Secondary | ICD-10-CM

## 2024-02-13 DIAGNOSIS — I10 Essential (primary) hypertension: Secondary | ICD-10-CM

## 2024-02-13 DIAGNOSIS — Z7984 Long term (current) use of oral hypoglycemic drugs: Secondary | ICD-10-CM | POA: Diagnosis not present

## 2024-02-13 DIAGNOSIS — R202 Paresthesia of skin: Secondary | ICD-10-CM

## 2024-02-13 LAB — LIPID PANEL
Cholesterol: 195 mg/dL (ref 0–200)
HDL: 55.6 mg/dL (ref >39.00)
LDL Cholesterol: 118 mg/dL — ABNORMAL HIGH (ref 0–99)
NonHDL: 139.68
Total CHOL/HDL Ratio: 4
Triglycerides: 110 mg/dL (ref 0.0–149.0)
VLDL: 22 mg/dL (ref 0.0–40.0)

## 2024-02-13 LAB — COMPREHENSIVE METABOLIC PANEL WITH GFR
ALT: 41 U/L (ref 0–53)
AST: 28 U/L (ref 0–37)
Albumin: 4.9 g/dL (ref 3.5–5.2)
Alkaline Phosphatase: 60 U/L (ref 39–117)
BUN: 21 mg/dL (ref 6–23)
CO2: 27 meq/L (ref 19–32)
Calcium: 9.7 mg/dL (ref 8.4–10.5)
Chloride: 101 meq/L (ref 96–112)
Creatinine, Ser: 0.6 mg/dL (ref 0.40–1.50)
GFR: 114.33 mL/min (ref >60.00)
Glucose, Bld: 125 mg/dL — ABNORMAL HIGH (ref 70–99)
Potassium: 4.4 meq/L (ref 3.5–5.1)
Sodium: 136 meq/L (ref 135–145)
Total Bilirubin: 0.6 mg/dL (ref 0.2–1.2)
Total Protein: 6.9 g/dL (ref 6.0–8.3)

## 2024-02-13 LAB — POCT GLYCOSYLATED HEMOGLOBIN (HGB A1C): Hemoglobin A1C: 8.2 % — AB (ref 4.0–5.6)

## 2024-02-13 LAB — LDL CHOLESTEROL, DIRECT: Direct LDL: 121 mg/dL

## 2024-02-13 MED ORDER — RYBELSUS 3 MG PO TABS
3.0000 mg | ORAL_TABLET | Freq: Every day | ORAL | Status: DC
Start: 2024-02-13 — End: 2024-03-10

## 2024-02-13 NOTE — Patient Instructions (Addendum)
 Continue glipizide  and Farxiga .  Add Rybelsus ,  starting dose 3 mg daily  Request the 7 mg dose toward the end of the month if tolerating without weight loss   We do NOT want you to lose weight,  so if the 3 mg dose causes weight loss we will need to resume insulin  once daily   Sample of Freestyle Libre 3Plus sample given today   Irbesartan is less $$ thatelmisartan ;  when you need a refill in September we will switch you

## 2024-02-13 NOTE — Assessment & Plan Note (Signed)
 Still uncontrolled based on review of the last week of CBG data.  Medication changes based on this review  are as follows: startig rybelsus3 mg dose given samples for 30 days

## 2024-02-13 NOTE — Progress Notes (Unsigned)
 Subjective:  Patient ID: Andrew Molina, male    DOB: 06/09/1976  Age: 48 y.o. MRN: [id number]  CC: The primary encounter diagnosis was Diabetes mellitus with microalbuminuric diabetic nephropathy (HCC). Diagnoses of Numbness and tingling of foot, Hyperlipidemia associated with type 2 diabetes mellitus (HCC), and Essential hypertension were also pertinent to this visit.   HPI Andrew Molina presents for  Chief Complaint  Patient presents with   Diabetes    3 month follow up   1) uncontrolled type 2 DM:  seen  4 months ago . Diabetes was uncontrolled by A1c. Which occurred after stopping GLP 1 agonist due to excessive weight loss of 30 lbs leading to underweight status  (starting BMI was < 25,  ending BMI was 20) .  Glipiide dose was increased to maximal therapeutic dose of 10 mg and Farxiga  was continued.  Asked to return in one month to reviews BS but presents today .  I have downloaded and reviewed the data from patient's continuous blood glucose monitor  for  the period  of   July 5 to July 11.  Patient's  sugars have been  IN RANGE   44  % OF THE TIME,   ABOVE RANGE   56 % of the time.  And  BELOW  RANGE   % OF THE TIME .  Medications currently used for glycemic control are glipizide  XL 10 mg daily and 10 mg Farxiga   daily.  Patient reports compliance with medication approximately  100  % of the time.  He  prefers to avoid adding more medication  and attributes the elevations to poor eating habits.     2) Anxiety greatly improving  since vaping cessation . Using xanax   prn stress  not daily.  Sleeping  well  3) htn:  taking telmisartan  because of a pharmacy error.  Not taking losartan  /hct.  OOP Cost/copay  of telmisartan  is $60/90 days .  Would prefer losartan  if less $$$    Lab Results  Component Value Date   HGBA1C 8.2 (A) 02/13/2024      Outpatient Medications Prior to Visit  Medication Sig Dispense Refill   ALPRAZolam  (XANAX ) 1 MG tablet TAKE 1 TABLET BY MOUTH ONCE DAILY AS NEEDED  FOR SLEEP 30 tablet 5   Continuous Glucose Sensor (FREESTYLE LIBRE 3 SENSOR) MISC Place 1 sensor on the skin every 14 days. Use to check glucose continuously 2 each 11   dapagliflozin  propanediol (FARXIGA ) 10 MG TABS tablet Take 1 tablet (10 mg total) by mouth daily before breakfast. 30 tablet 3   glipiZIDE  (GLUCOTROL  XL) 10 MG 24 hr tablet Take 1 tablet (10 mg total) by mouth daily with breakfast. 90 tablet 1   metFORMIN  (GLUCOPHAGE -XR) 750 MG 24 hr tablet Take 1 tablet (750 mg total) by mouth daily with breakfast. 90 tablet 1   naproxen sodium (ALEVE) 220 MG tablet Take 220 mg by mouth daily as needed.     telmisartan  (MICARDIS ) 40 MG tablet Take 40 mg by mouth daily.     atorvastatin  (LIPITOR) 20 MG tablet Take 1 tablet (20 mg total) by mouth daily. 90 tablet 3   glucose blood test strip Check sugar twice daily. One Touch Ultra strips. Dx: E11.65 200 each 5   losartan -hydrochlorothiazide (HYZAAR) 100-12.5 MG tablet Take 1 tablet by mouth daily. 90 tablet 1   No facility-administered medications prior to visit.    Review of Systems;  Patient denies headache, fevers, malaise, unintentional weight loss,  skin rash, eye pain, sinus congestion and sinus pain, sore throat, dysphagia,  hemoptysis , cough, dyspnea, wheezing, chest pain, palpitations, orthopnea, edema, abdominal pain, nausea, melena, diarrhea, constipation, flank pain, dysuria, hematuria, urinary  Frequency, nocturia, numbness, tingling, seizures,  Focal weakness, Loss of consciousness,  Tremor, insomnia, depression, anxiety, and suicidal ideation.      Objective:  BP 126/68   Pulse 98   Temp 98.3 F (36.8 C)   Resp 20   Ht 5' 10 (1.778 m)   Wt 158 lb 6 oz (71.8 kg)   SpO2 98%   BMI 22.72 kg/m   BP Readings from Last 3 Encounters:  02/13/24 126/68  10/17/23 130/84  07/18/23 136/86    Wt Readings from Last 3 Encounters:  02/13/24 158 lb 6 oz (71.8 kg)  10/17/23 157 lb 12.8 oz (71.6 kg)  07/18/23 151 lb (68.5 kg)     Physical Exam Vitals reviewed.  Constitutional:      General: He is not in acute distress.    Appearance: Normal appearance. He is normal weight. He is not ill-appearing, toxic-appearing or diaphoretic.  HENT:     Head: Normocephalic.  Eyes:     General: No scleral icterus.       Right eye: No discharge.        Left eye: No discharge.     Conjunctiva/sclera: Conjunctivae normal.  Cardiovascular:     Rate and Rhythm: Normal rate and regular rhythm.     Heart sounds: Normal heart sounds.  Pulmonary:     Effort: Pulmonary effort is normal. No respiratory distress.     Breath sounds: Normal breath sounds.  Musculoskeletal:        General: Normal range of motion.     Cervical back: Normal range of motion.  Skin:    General: Skin is warm and dry.  Neurological:     General: No focal deficit present.     Mental Status: He is alert and oriented to person, place, and time. Mental status is at baseline.  Psychiatric:        Mood and Affect: Mood normal.        Behavior: Behavior normal.        Thought Content: Thought content normal.        Judgment: Judgment normal.     Lab Results  Component Value Date   HGBA1C 8.2 (A) 02/13/2024   HGBA1C 9.3 (H) 10/17/2023   HGBA1C 6.1 07/18/2023    Lab Results  Component Value Date   CREATININE 0.60 02/13/2024   CREATININE 0.68 10/17/2023   CREATININE 0.65 07/18/2023    Lab Results  Component Value Date   WBC 6.2 10/17/2023   HGB 17.1 (H) 10/17/2023   HCT 49.9 10/17/2023   PLT 220.0 10/17/2023   GLUCOSE 125 (H) 02/13/2024   CHOL 195 02/13/2024   TRIG 110.0 02/13/2024   HDL 55.60 02/13/2024   LDLDIRECT 121.0 02/13/2024   LDLCALC 118 (H) 02/13/2024   ALT 41 02/13/2024   AST 28 02/13/2024   NA 136 02/13/2024   K 4.4 02/13/2024   CL 101 02/13/2024   CREATININE 0.60 02/13/2024   BUN 21 02/13/2024   CO2 27 02/13/2024   TSH 1.60 10/17/2023   PSA 0.60 09/27/2022   HGBA1C 8.2 (A) 02/13/2024   MICROALBUR 4.8 (H) 10/17/2023     DG Facial Bones 1-2 Views Result Date: 02/18/2019 CLINICAL DATA:  Foreign body post injury 1.5 months ago. EXAM: FACIAL BONES - 1-2 VIEW COMPARISON:  None. FINDINGS: There is no evidence of fracture or other significant bone abnormality. No orbital emphysema or sinus air-fluid levels are seen. There is a 6 mm metallic fragment within the soft tissues overlying the left zygomatic arch. IMPRESSION: 6 mm metallic foreign body within the soft tissues overlying the left zygomatic arch. Electronically Signed   By: Dobrinka  Dimitrova M.D.   On: 02/18/2019 14:33    Assessment & Plan:  .Diabetes mellitus with microalbuminuric diabetic nephropathy (HCC) Assessment & Plan: Improved from previously  uncontrolled state based on review of the last week of CBG data and current A1c >8, .  He is receptive to improving adherence to a low GI diet and adding oral semaglutide     with starting dose of  rybelsus3 mg dose given samples for 30 days   Orders: -     POCT glycosylated hemoglobin (Hb A1C) -     Comprehensive metabolic panel with GFR -     Lipid panel -     LDL cholesterol, direct  Numbness and tingling of foot  Hyperlipidemia associated with type 2 diabetes mellitus (HCC) Assessment & Plan: Increasing atorvastatin  to 40 mg dail for goal LDL<70  Lab Results  Component Value Date   CHOL 195 02/13/2024   HDL 55.60 02/13/2024   LDLCALC 118 (H) 02/13/2024   LDLDIRECT 121.0 02/13/2024   TRIG 110.0 02/13/2024   CHOLHDL 4 02/13/2024      Essential hypertension Assessment & Plan: Currently taking telmisartan , Changing to losartan  hct if cost is similar with next refill.    Other orders -     Rybelsus ; Take 1 tablet (3 mg total) by mouth daily.     In addition to time spend reviewing CBG data,  I spent 34 minutes on the day of this face to face encounter reviewing patient's  most recent  labs and imaging studies, counseling on diabetes and glycemic management,  reviewing the assessment  and plan with patient, and post visit ordering and reviewing of  diagnostics and therapeutics with patient  .   Follow-up: Return in about 3 months (around 05/15/2024) for follow up diabetes.   Verneita LITTIE Kettering, MD

## 2024-02-15 ENCOUNTER — Ambulatory Visit: Payer: Self-pay | Admitting: Internal Medicine

## 2024-02-15 DIAGNOSIS — E1165 Type 2 diabetes mellitus with hyperglycemia: Secondary | ICD-10-CM

## 2024-02-15 MED ORDER — ATORVASTATIN CALCIUM 40 MG PO TABS: 40.0000 mg | ORAL_TABLET | Freq: Every day | ORAL | 1 refills | Status: DC

## 2024-02-15 NOTE — Assessment & Plan Note (Signed)
 Increasing atorvastatin  to 40 mg dail for goal LDL<70  Lab Results  Component Value Date   CHOL 195 02/13/2024   HDL 55.60 02/13/2024   LDLCALC 118 (H) 02/13/2024   LDLDIRECT 121.0 02/13/2024   TRIG 110.0 02/13/2024   CHOLHDL 4 02/13/2024

## 2024-02-15 NOTE — Assessment & Plan Note (Signed)
 Currently taking telmisartan , Changing to losartan  hct if cost is similar with next refill.

## 2024-02-17 ENCOUNTER — Encounter: Payer: Self-pay | Admitting: Internal Medicine

## 2024-03-03 ENCOUNTER — Other Ambulatory Visit: Payer: Self-pay | Admitting: Internal Medicine

## 2024-03-09 ENCOUNTER — Encounter: Payer: Self-pay | Admitting: Internal Medicine

## 2024-03-10 MED ORDER — RYBELSUS 7 MG PO TABS: 7.0000 mg | ORAL_TABLET | Freq: Every day | ORAL | 5 refills | Status: DC

## 2024-04-24 ENCOUNTER — Other Ambulatory Visit: Payer: Self-pay | Admitting: Internal Medicine

## 2024-04-24 DIAGNOSIS — E1165 Type 2 diabetes mellitus with hyperglycemia: Secondary | ICD-10-CM

## 2024-04-26 ENCOUNTER — Telehealth: Payer: Self-pay | Admitting: Internal Medicine

## 2024-04-26 NOTE — Telephone Encounter (Signed)
 Lm and sent Mychart message to call office to reschedule October 10th appointment/provider off.

## 2024-05-01 ENCOUNTER — Other Ambulatory Visit: Payer: Self-pay | Admitting: Internal Medicine

## 2024-05-14 ENCOUNTER — Ambulatory Visit: Admitting: Internal Medicine

## 2024-05-21 ENCOUNTER — Encounter: Payer: Self-pay | Admitting: Internal Medicine

## 2024-05-21 ENCOUNTER — Ambulatory Visit: Admitting: Internal Medicine

## 2024-05-21 VITALS — BP 112/62 | HR 67 | Ht 70.0 in | Wt 161.0 lb

## 2024-05-21 DIAGNOSIS — Z23 Encounter for immunization: Secondary | ICD-10-CM

## 2024-05-21 DIAGNOSIS — E1165 Type 2 diabetes mellitus with hyperglycemia: Secondary | ICD-10-CM | POA: Diagnosis not present

## 2024-05-21 DIAGNOSIS — E1169 Type 2 diabetes mellitus with other specified complication: Secondary | ICD-10-CM

## 2024-05-21 DIAGNOSIS — S91331A Puncture wound without foreign body, right foot, initial encounter: Secondary | ICD-10-CM | POA: Diagnosis not present

## 2024-05-21 DIAGNOSIS — Z7984 Long term (current) use of oral hypoglycemic drugs: Secondary | ICD-10-CM

## 2024-05-21 DIAGNOSIS — E1121 Type 2 diabetes mellitus with diabetic nephropathy: Secondary | ICD-10-CM | POA: Diagnosis not present

## 2024-05-21 DIAGNOSIS — I10 Essential (primary) hypertension: Secondary | ICD-10-CM

## 2024-05-21 DIAGNOSIS — E785 Hyperlipidemia, unspecified: Secondary | ICD-10-CM | POA: Diagnosis not present

## 2024-05-21 LAB — COMPREHENSIVE METABOLIC PANEL WITH GFR
ALT: 39 U/L (ref 0–53)
AST: 23 U/L (ref 0–37)
Albumin: 4.9 g/dL (ref 3.5–5.2)
Alkaline Phosphatase: 55 U/L (ref 39–117)
BUN: 16 mg/dL (ref 6–23)
CO2: 29 meq/L (ref 19–32)
Calcium: 9.5 mg/dL (ref 8.4–10.5)
Chloride: 100 meq/L (ref 96–112)
Creatinine, Ser: 0.63 mg/dL (ref 0.40–1.50)
GFR: 112.45 mL/min (ref >60.00)
Glucose, Bld: 125 mg/dL — ABNORMAL HIGH (ref 70–99)
Potassium: 4.7 meq/L (ref 3.5–5.1)
Sodium: 138 meq/L (ref 135–145)
Total Bilirubin: 0.7 mg/dL (ref 0.2–1.2)
Total Protein: 7 g/dL (ref 6.0–8.3)

## 2024-05-21 LAB — CBC WITH DIFFERENTIAL/PLATELET
Basophils Absolute: 0.1 K/uL (ref 0.0–0.1)
Basophils Relative: 1.1 % (ref 0.0–3.0)
Eosinophils Absolute: 0.4 K/uL (ref 0.0–0.7)
Eosinophils Relative: 7.7 % — ABNORMAL HIGH (ref 0.0–5.0)
HCT: 46.9 % (ref 39.0–52.0)
Hemoglobin: 16.1 g/dL (ref 13.0–17.0)
Lymphocytes Relative: 22.1 % (ref 12.0–46.0)
Lymphs Abs: 1 K/uL (ref 0.7–4.0)
MCHC: 34.3 g/dL (ref 30.0–36.0)
MCV: 93.6 fl (ref 78.0–100.0)
Monocytes Absolute: 0.4 K/uL (ref 0.1–1.0)
Monocytes Relative: 8.8 % (ref 3.0–12.0)
Neutro Abs: 2.8 K/uL (ref 1.4–7.7)
Neutrophils Relative %: 60.3 % (ref 43.0–77.0)
Platelets: 209 K/uL (ref 150.0–400.0)
RBC: 5.01 Mil/uL (ref 4.22–5.81)
RDW: 12.7 % (ref 11.5–15.5)
WBC: 4.7 K/uL (ref 4.0–10.5)

## 2024-05-21 LAB — MICROALBUMIN / CREATININE URINE RATIO
Creatinine,U: 31 mg/dL
Microalb Creat Ratio: 92.5 mg/g — ABNORMAL HIGH (ref 0.0–30.0)
Microalb, Ur: 2.9 mg/dL — ABNORMAL HIGH (ref 0.0–1.9)

## 2024-05-21 LAB — LIPID PANEL
Cholesterol: 131 mg/dL (ref 0–200)
HDL: 49.3 mg/dL (ref >39.00)
LDL Cholesterol: 66 mg/dL (ref 0–99)
NonHDL: 81.3
Total CHOL/HDL Ratio: 3
Triglycerides: 76 mg/dL (ref 0.0–149.0)
VLDL: 15.2 mg/dL (ref 0.0–40.0)

## 2024-05-21 LAB — HEMOGLOBIN A1C: Hgb A1c MFr Bld: 7.3 % — ABNORMAL HIGH (ref 4.6–6.5)

## 2024-05-21 LAB — LDL CHOLESTEROL, DIRECT: Direct LDL: 67 mg/dL

## 2024-05-21 MED ORDER — RYBELSUS 14 MG PO TABS: 14.0000 mg | ORAL_TABLET | Freq: Every day | ORAL | 2 refills | Status: DC

## 2024-05-21 MED ORDER — CEPHALEXIN 500 MG PO CAPS: 500.0000 mg | ORAL_CAPSULE | Freq: Four times a day (QID) | ORAL | 0 refills | Status: DC

## 2024-05-21 MED ORDER — FARXIGA 10 MG PO TABS: 10.0000 mg | ORAL_TABLET | Freq: Every day | ORAL | 1 refills | Status: AC

## 2024-05-21 MED ORDER — METFORMIN HCL ER 750 MG PO TB24: 1500.0000 mg | ORAL_TABLET | Freq: Every day | ORAL | 1 refills | Status: AC

## 2024-05-21 MED ORDER — GLIPIZIDE ER 10 MG PO TB24: 10.0000 mg | ORAL_TABLET | Freq: Every day | ORAL | 1 refills | Status: AC

## 2024-05-21 NOTE — Assessment & Plan Note (Signed)
 Wound incurred 5 days ago,  has been cleaning it fastidiously with peroxide and topical abx.  No signs of infection currently but given his uncontrolled DM will give rx for cephalexin  to start if any redness o purulent drainage occurs.  TDaP given today as he is due

## 2024-05-21 NOTE — Progress Notes (Unsigned)
 Subjective:  Patient ID: Andrew Molina, male    DOB: April 21, 1976  Age: 48 y.o. MRN: [id number]  CC: The primary encounter diagnosis was Essential hypertension. Diagnoses of Type 2 diabetes mellitus with hyperglycemia, without long-term current use of insulin  (HCC), Hyperlipidemia associated with type 2 diabetes mellitus (HCC), Puncture wound of foot excluding toes without complication, right, initial encounter, and Diabetes mellitus with microalbuminuric diabetic nephropathy (HCC) were also pertinent to this visit.   HPI Andrew Molina presents for  Chief Complaint  Patient presents with   Medical Management of Chronic Issues    Follow up on diabetes    1) uncontrolled type 2 DM  :   taking glipizide  xl 10 mg   rybelsus  7 mg and metformin  XR 750 mg daily .  I have downloaded and reviewed the data from patient's continuous blood glucose monitor  for  the period  of       .  Patient's  sugars have been  IN RANGE 53    % OF THE TIME,   ABOVE RANGE 47   % of the time.  Average blood glucose is  189     with a variability of   31%    .  Patient reports compliance with medication approximately  90  % of the time.      2) on Sunday he stepped on a broken pine tree branch and developed a puncture   wound  of the left foot on the instep  from a pine tree branch. Due for TdaP   3) recurring mouth ulcer in area of frction ; lower lip on the right  Outpatient Medications Prior to Visit  Medication Sig Dispense Refill   ALPRAZolam  (XANAX ) 1 MG tablet TAKE 1 TABLET BY MOUTH ONCE DAILY AS NEEDED FOR SLEEP 30 tablet 5   atorvastatin  (LIPITOR) 40 MG tablet Take 1 tablet (40 mg total) by mouth daily. 90 tablet 1   Continuous Glucose Sensor (FREESTYLE LIBRE 3 SENSOR) MISC Place 1 sensor on the skin every 14 days. Use to check glucose continuously 2 each 11   glucose blood test strip Check sugar twice daily. One Touch Ultra strips. Dx: E11.65 200 each 5   naproxen sodium (ALEVE) 220 MG tablet Take 220 mg by mouth  daily as needed.     telmisartan  (MICARDIS ) 40 MG tablet Take 40 mg by mouth daily.     FARXIGA  10 MG TABS tablet TAKE 1 TABLET BY MOUTH ONCE DAILY BEFORE BREAKFAST 30 tablet 2   glipiZIDE  (GLUCOTROL  XL) 10 MG 24 hr tablet Take 1 tablet by mouth once daily with breakfast 90 tablet 0   metFORMIN  (GLUCOPHAGE -XR) 750 MG 24 hr tablet Take 1 tablet (750 mg total) by mouth daily with breakfast. 90 tablet 1   Semaglutide  (RYBELSUS ) 7 MG TABS Take 1 tablet (7 mg total) by mouth daily. 30 tablet 5   No facility-administered medications prior to visit.    Review of Systems;  Patient denies headache, fevers, malaise, unintentional weight loss, skin rash, eye pain, sinus congestion and sinus pain, sore throat, dysphagia,  hemoptysis , cough, dyspnea, wheezing, chest pain, palpitations, orthopnea, edema, abdominal pain, nausea, melena, diarrhea, constipation, flank pain, dysuria, hematuria, urinary  Frequency, nocturia, numbness, tingling, seizures,  Focal weakness, Loss of consciousness,  Tremor, insomnia, depression, anxiety, and suicidal ideation.      Objective:  BP 112/62   Pulse 67   Ht 5' 10 (1.778 m)   Wt 161 lb (73  kg)   SpO2 98%   BMI 23.10 kg/m   BP Readings from Last 3 Encounters:  05/21/24 112/62  02/13/24 126/68  10/17/23 130/84    Wt Readings from Last 3 Encounters:  05/21/24 161 lb (73 kg)  02/13/24 158 lb 6 oz (71.8 kg)  10/17/23 157 lb 12.8 oz (71.6 kg)    Physical Exam Vitals reviewed.  Constitutional:      General: He is not in acute distress.    Appearance: Normal appearance. He is normal weight. He is not ill-appearing, toxic-appearing or diaphoretic.  HENT:     Head: Normocephalic.     Mouth/Throat:     Mouth: Mucous membranes are moist.     Comments: Small papilla inisde of lower lip on left.  Eyes:     General: No scleral icterus.       Right eye: No discharge.        Left eye: No discharge.     Conjunctiva/sclera: Conjunctivae normal.  Cardiovascular:      Rate and Rhythm: Normal rate and regular rhythm.     Heart sounds: Normal heart sounds.  Pulmonary:     Effort: Pulmonary effort is normal. No respiratory distress.     Breath sounds: Normal breath sounds.  Musculoskeletal:        General: Normal range of motion.     Cervical back: Normal range of motion.  Skin:    General: Skin is warm and dry.  Neurological:     General: No focal deficit present.     Mental Status: He is alert and oriented to person, place, and time. Mental status is at baseline.  Psychiatric:        Mood and Affect: Mood normal.        Behavior: Behavior normal.        Thought Content: Thought content normal.        Judgment: Judgment normal.     Lab Results  Component Value Date   HGBA1C 7.3 (H) 05/21/2024   HGBA1C 8.2 (A) 02/13/2024   HGBA1C 9.3 (H) 10/17/2023    Lab Results  Component Value Date   CREATININE 0.63 05/21/2024   CREATININE 0.60 02/13/2024   CREATININE 0.68 10/17/2023    Lab Results  Component Value Date   WBC 4.7 05/21/2024   HGB 16.1 05/21/2024   HCT 46.9 05/21/2024   PLT 209.0 05/21/2024   GLUCOSE 125 (H) 05/21/2024   CHOL 131 05/21/2024   TRIG 76.0 05/21/2024   HDL 49.30 05/21/2024   LDLDIRECT 67.0 05/21/2024   LDLCALC 66 05/21/2024   ALT 39 05/21/2024   AST 23 05/21/2024   NA 138 05/21/2024   K 4.7 05/21/2024   CL 100 05/21/2024   CREATININE 0.63 05/21/2024   BUN 16 05/21/2024   CO2 29 05/21/2024   TSH 1.60 10/17/2023   PSA 0.60 09/27/2022   HGBA1C 7.3 (H) 05/21/2024   MICROALBUR 2.9 (H) 05/21/2024    DG Facial Bones 1-2 Views Result Date: 02/18/2019 CLINICAL DATA:  Foreign body post injury 1.5 months ago. EXAM: FACIAL BONES - 1-2 VIEW COMPARISON:  None. FINDINGS: There is no evidence of fracture or other significant bone abnormality. No orbital emphysema or sinus air-fluid levels are seen. There is a 6 mm metallic fragment within the soft tissues overlying the left zygomatic arch. IMPRESSION: 6 mm metallic  foreign body within the soft tissues overlying the left zygomatic arch. Electronically Signed   By: Dobrinka  Dimitrova M.D.   On: 02/18/2019 14:33  Assessment & Plan:  .Essential hypertension Assessment & Plan: Currently taking telmisartan , BP is at goal    Orders: -     Comprehensive metabolic panel with GFR  Type 2 diabetes mellitus with hyperglycemia, without long-term current use of insulin  (HCC) -     Farxiga ; Take 1 tablet (10 mg total) by mouth daily before breakfast.  Dispense: 90 tablet; Refill: 1 -     Comprehensive metabolic panel with GFR -     Hemoglobin A1c  Hyperlipidemia associated with type 2 diabetes mellitus (HCC) Assessment & Plan: Tolerating increased dose of  atorvastatin  to 40 mg dail for goal LDL<70.  Diabetes not controlled based on review of CBG monitor on max doses of glipizide  and farxiga .  Increase rybelsus  to 14 mg and metformin  to 1500 mg    Orders: -     Lipid panel -     LDL cholesterol, direct  Puncture wound of foot excluding toes without complication, right, initial encounter Assessment & Plan: Wound incurred 5 days ago,  has been cleaning it fastidiously with peroxide and topical abx.  No signs of infection currently but given his uncontrolled DM will give rx for cephalexin  to start if any redness o purulent drainage occurs.  TDaP given today as he is due   Orders: -     CBC with Differential/Platelet -     Tdap vaccine greater than or equal to 7yo IM  Diabetes mellitus with microalbuminuric diabetic nephropathy (HCC) Assessment & Plan: Improved, but not  at goal  on current regimen.  Will increas Rybelsus  to 14 mg , increase metofrminto 1500 mg.  continue telmisartan  and farxiga   for UaCR > 150 . Repeat today has improved,  but still > 30.   Will discuss adding  Jardiance  at follow up   Lab Results  Component Value Date   HGBA1C 7.3 (H) 05/21/2024   Lab Results  Component Value Date   MICROALBUR 2.9 (H) 05/21/2024   MICROALBUR 4.8  (H) 10/17/2023       Orders: -     Microalbumin / creatinine urine ratio  Other orders -     glipiZIDE  ER; Take 1 tablet (10 mg total) by mouth daily with breakfast.  Dispense: 90 tablet; Refill: 1 -     metFORMIN  HCl ER; Take 2 tablets (1,500 mg total) by mouth daily with breakfast.  Dispense: 180 tablet; Refill: 1 -     Cephalexin ; Take 1 capsule (500 mg total) by mouth 4 (four) times daily.  Dispense: 28 capsule; Refill: 0 -     Rybelsus ; Take 1 tablet (14 mg total) by mouth daily.  Dispense: 30 tablet; Refill: 2    I personally spent a total of 30 minutes in the care of the patient today including getting/reviewing separately obtained history, performing a medically appropriate exam/evaluation, counseling and educating, placing orders, independently interpreting results, and communicating results .  Follow-up: Return in about 3 months (around 08/20/2024) for follow up diabetes.   Verneita LITTIE Kettering, MD

## 2024-05-21 NOTE — Assessment & Plan Note (Signed)
 Currently taking telmisartan , BP is at goal

## 2024-05-21 NOTE — Patient Instructions (Addendum)
 I have increased the Rybelsus  dose to 14 mg daily  Try increasing metformin  to 1500 mg daily  Continue Farxiga  10 and glipizide  10     Watch your foot wound ,  start the antibiotic if you develop redness or discolored drainage   You received your tetanus booster today  Daily use of a probiotic advised for 3 weeks if you start the antibiotic

## 2024-05-21 NOTE — Assessment & Plan Note (Addendum)
 continue telmisartan  and farxiga   for UaCR > 150 . Repeating today

## 2024-05-21 NOTE — Assessment & Plan Note (Signed)
 Tolerating increased dose of  atorvastatin  to 40 mg dail for goal LDL<70.  Diabetes not controlled based on review of CBG monitor on max doses of glipizide  and farxiga .  Increase rybelsus  to 14 mg and metformin  to 1500 mg

## 2024-05-23 ENCOUNTER — Ambulatory Visit: Payer: Self-pay | Admitting: Internal Medicine

## 2024-05-25 MED ORDER — TELMISARTAN 80 MG PO TABS: 80.0000 mg | ORAL_TABLET | Freq: Every day | ORAL | 1 refills | Status: AC

## 2024-06-09 ENCOUNTER — Encounter: Payer: Self-pay | Admitting: Internal Medicine

## 2024-06-09 MED ORDER — FREESTYLE LIBRE 3 PLUS SENSOR MISC: 3 refills | Status: AC

## 2024-06-18 NOTE — Progress Notes (Signed)
 Andrew Molina                                          MRN: [id number]   06/18/2024   The [facility] Quality Team Specialist reviewed this patient medical record for the purposes of chart review for care gap closure. The following were reviewed: abstraction for care gap closure-glycemic status assessment and kidney health evaluation for diabetes:eGFR  and uACR.    [facility] Quality Team

## 2024-07-20 ENCOUNTER — Ambulatory Visit: Payer: Self-pay

## 2024-07-20 ENCOUNTER — Emergency Department

## 2024-07-20 ENCOUNTER — Emergency Department: Admission: EM | Admit: 2024-07-20 | Discharge: 2024-07-20 | Disposition: A | Discharge status: Home / Self Care

## 2024-07-20 ENCOUNTER — Other Ambulatory Visit: Payer: Self-pay

## 2024-07-20 DIAGNOSIS — R4702 Dysphasia: Secondary | ICD-10-CM | POA: Diagnosis not present

## 2024-07-20 DIAGNOSIS — F419 Anxiety disorder, unspecified: Secondary | ICD-10-CM | POA: Diagnosis not present

## 2024-07-20 DIAGNOSIS — I10 Essential (primary) hypertension: Secondary | ICD-10-CM | POA: Diagnosis not present

## 2024-07-20 DIAGNOSIS — T424X5A Adverse effect of benzodiazepines, initial encounter: Secondary | ICD-10-CM | POA: Diagnosis not present

## 2024-07-20 DIAGNOSIS — R4789 Other speech disturbances: Secondary | ICD-10-CM

## 2024-07-20 DIAGNOSIS — E119 Type 2 diabetes mellitus without complications: Secondary | ICD-10-CM | POA: Diagnosis not present

## 2024-07-20 DIAGNOSIS — Z5321 Procedure and treatment not carried out due to patient leaving prior to being seen by health care provider: Secondary | ICD-10-CM | POA: Diagnosis not present

## 2024-07-20 DIAGNOSIS — T887XXA Unspecified adverse effect of drug or medicament, initial encounter: Secondary | ICD-10-CM

## 2024-07-20 LAB — CBC
HCT: 46.8 % (ref 39.0–52.0)
Hemoglobin: 15.7 g/dL (ref 13.0–17.0)
MCH: 31.2 pg (ref 26.0–34.0)
MCHC: 33.5 g/dL (ref 30.0–36.0)
MCV: 93 fL (ref 80.0–100.0)
Platelets: 225 K/uL (ref 150–400)
RBC: 5.03 MIL/uL (ref 4.22–5.81)
RDW: 12.1 % (ref 11.5–15.5)
WBC: 5.3 K/uL (ref 4.0–10.5)
nRBC: 0 % (ref 0.0–0.2)

## 2024-07-20 LAB — DIFFERENTIAL
Abs Immature Granulocytes: 0.02 K/uL (ref 0.00–0.07)
Basophils Absolute: 0.1 K/uL (ref 0.0–0.1)
Basophils Relative: 2 %
Eosinophils Absolute: 0.5 K/uL (ref 0.0–0.5)
Eosinophils Relative: 9 %
Immature Granulocytes: 0 %
Lymphocytes Relative: 23 %
Lymphs Abs: 1.2 K/uL (ref 0.7–4.0)
Monocytes Absolute: 0.4 K/uL (ref 0.1–1.0)
Monocytes Relative: 7 %
Neutro Abs: 3.1 K/uL (ref 1.7–7.7)
Neutrophils Relative %: 59 %

## 2024-07-20 LAB — COMPREHENSIVE METABOLIC PANEL WITH GFR
ALT: 48 U/L — ABNORMAL HIGH (ref 0–44)
AST: 29 U/L (ref 15–41)
Albumin: 4.6 g/dL (ref 3.5–5.0)
Alkaline Phosphatase: 60 U/L (ref 38–126)
Anion gap: 13 (ref 5–15)
BUN: 14 mg/dL (ref 6–20)
CO2: 24 mmol/L (ref 22–32)
Calcium: 9.3 mg/dL (ref 8.9–10.3)
Chloride: 99 mmol/L (ref 98–111)
Creatinine, Ser: 0.72 mg/dL (ref 0.61–1.24)
GFR, Estimated: >60 mL/min (ref >60)
Glucose, Bld: 149 mg/dL — ABNORMAL HIGH (ref 70–99)
Potassium: 3.9 mmol/L (ref 3.5–5.1)
Sodium: 135 mmol/L (ref 135–145)
Total Bilirubin: 0.3 mg/dL (ref 0.0–1.2)
Total Protein: 6.9 g/dL (ref 6.5–8.1)

## 2024-07-20 LAB — APTT: aPTT: 28 s (ref 24–36)

## 2024-07-20 LAB — ETHANOL: Alcohol, Ethyl (B): <15 mg/dL (ref <15)

## 2024-07-20 LAB — CBG MONITORING, ED
Glucose-Capillary: 142 mg/dL — ABNORMAL HIGH (ref 70–99)
Glucose-Capillary: 71 mg/dL (ref 70–99)

## 2024-07-20 LAB — PROTIME-INR
INR: 0.9 (ref 0.8–1.2)
Prothrombin Time: 12.6 s (ref 11.4–15.2)

## 2024-07-20 MED ORDER — SODIUM CHLORIDE 0.9% FLUSH: 3.0000 mL | Freq: Once | INTRAVENOUS | Status: DC

## 2024-07-20 NOTE — ED Provider Notes (Signed)
 [facility] Provider Note    Event Date/Time   First MD Initiated Contact with Patient 07/20/24 1457     (approximate)   History   Aphasia  First nurse note: Pt to ED via POV from [facility]. Pt reports went to a meeting at 0830 and staff noticed slurring speech, walking into walks and off balance. No hx of CVA. Hx DM. Pt reports glucometer has not been working. Pt did take a xanax  prior to symptom onset.   Went to work this morning.  Felt some anxiety.  Patient took 2 Xanax  and went to a meeting at work.  Pt's boss told patient that she noticed that patient was walking funny, speech was slow and a little slurred.  When patient left meeting patient had a piece of hard candy and an orange. States he was walking and felt unbalanced, lean while walking, bumping into things.  Stated he felt like he was having trouble getting words out. Got home fell asleep in recliner.  Presents AAOx3. Skin warm and dry. MAE equally and strong. NAD   HPI Andrew Molina is a 48 y.o. male PMH diabetes, hypertension, hyperlipidemia, GAD, NAFLD presents for evaluation of reported slurred speech - Patient reportedly went to work this morning, felt anxious, took 2 Xanax  and went to a meeting at work.  Patient's boss told him that she noticed he was walking funny and speech was slow and a little slurred.  Comes to ED for eval. - Patient states he was feeling anxious at work this morning, took a pill of Xanax  and subsequently took a second pill of Xanax  at some point thereafter.  His boss approached him after a work meeting around 9 AM and told him that he seemed to be speaking slower than usual, also wondered if patient may be drifting to the side when he walks.  Decided he should go home.  Wife noticed that patient was slow with speech and sometimes had word finding difficulty, also felt he looked somewhat unsteady on his feet so brought him to ED for eval. - Notes patient is returned to his baseline -  No episodes of focal weakness or numbness, no facial droop - No history of prior stroke - Patient usually takes Xanax  about 3 times weekly.  Normally takes 1 pill, rarely takes 2 pills, last time he did this was probably about a month ago.  Notes he feels spacey when that happens but has not had the symptoms before. - No chest pain, shortness of breath, abdominal pain, nausea/vomiting/diarrhea - No other substance use - Wife confirms patient is currently at his baseline - Patient also states his continuous monitoring glucometer beeped at him earlier today stating had a blood sugar of 69.  He then ate some things and started feeling somewhat better.  Notes the glucometer very frequently gives false readings of hypoglycemia which he checks with a finger prick glucometer but did not have it with him today.  Wife states his glucose was in the 110s at home.  Per chart review, last seen in primary care clinic on 05/21/24.  Noted to have poorly controlled diabetes, A1c from same day 7.3.     Physical Exam   Triage Vital Signs: ED Triage Vitals [07/20/24 1416]  Encounter Vitals Group     BP (!) 142/96     Girls Systolic BP Percentile      Girls Diastolic BP Percentile      Boys Systolic BP Percentile  Boys Diastolic BP Percentile      Pulse Rate 91     Resp 16     Temp 98 F (36.7 C)     Temp Source Oral     SpO2 100 %     Weight 160 lb 15 oz (73 kg)     Height      Head Circumference      Peak Flow      Pain Score 0     Pain Loc      Pain Education      Exclude from Growth Chart     Most recent vital signs: Vitals:   07/20/24 1416 07/20/24 1700  BP: (!) 142/96 120/84  Pulse: 91 79  Resp: 16 19  Temp: 98 F (36.7 C)   SpO2: 100% 100%     General: Awake, no distress.  CV:  Good peripheral perfusion. RRR, RP 2+ Resp:  Normal effort. CTAB Abd:  No distention. Nontender to deep palpation throughout Neuro:  Aox4, CN II-XII intact, FNF wnl, finger taps fast b/l, 5/5  strength in bilateral finger extension/grip, arm flexion/extension, EHL/FHL. BUE AG 10+ sec no drift, BLE AG 5+ sec no drift. Ambulates with steady gait. SILT. Negative Rhomberg.    ED Results / Procedures / Treatments   Labs (all labs ordered are listed, but only abnormal results are displayed) Labs Reviewed  COMPREHENSIVE METABOLIC PANEL WITH GFR - Abnormal; Notable for the following components:      Result Value   Glucose, Bld 149 (*)    ALT 48 (*)    All other components within normal limits  CBG MONITORING, ED - Abnormal; Notable for the following components:   Glucose-Capillary 142 (*)    All other components within normal limits  PROTIME-INR  APTT  CBC  DIFFERENTIAL  ETHANOL  CBG MONITORING, ED  I-STAT CREATININE, ED     EKG  Ecg = sinus rhythm, rate 83, no gross ST elevation or depression, left axis deviation, normal intervals.  No clear evidence of ischemia no arrhythmia on my interpretation.   RADIOLOGY Radiology interpreted by myself radiology report reviewed.  No acute pathology identified.    PROCEDURES:  Critical Care performed: No  Procedures   MEDICATIONS ORDERED IN ED: Medications  sodium chloride  flush (NS) 0.9 % injection 3 mL ( Intravenous Canceled Entry 07/20/24 1419)     IMPRESSION / MDM / ASSESSMENT AND PLAN / ED COURSE  I reviewed the triage vital signs and the nursing notes.                              DDX/MDM/AP: Differential diagnosis includes, but is not limited to, likely medication side effect from taking more Xanax  than usual this morning and associated sedation.  Nonfocal neurologic exam here and no clear focal findings beyond some slowed speech and possible mild word finding difficulty --  again suspect medication side effect though consider possibility of underlying CVA.  Consider TIA but I think less likely at this time.  Doubt electrolyte abnormality, anemia.  Plan: - Labs - CT head - EKG - Cardiac monitor -  Reassess  Patient's presentation is most consistent with acute presentation with potential threat to life or bodily function.  The patient is on the cardiac monitor to evaluate for evidence of arrhythmia and/or significant heart rate changes.  ED course below.  Laboratory workup and CT head unremarkable.  Patient remains with nonfocal neurologic exam  here and at baseline mental state per wife at bedside.  In shared decision making, did escalate to MRI and to rule out underlying CVA --subsequently negative.  Overall suspect likely medication side effect from having taken to Xanax  today which she does not do frequently.  No clear focal findings and some slowing of his speech and nonfocal neurologic exam here, clinically doubt TIA.  Shared decision making patient prefers discharge home as opposed to admission which I believe is very reasonable.  Plan to follow-up with his PMD.  ED return precautions in place.  Patient agrees with this plan.  Clinical Course as of 07/20/24 1940  Tue Jul 20, 2024  1530 CBC, CMP reviewed, unremarkable Etoh neg [MM]  1531 CTH: IMPRESSION: 1. No acute findings. 2. Mild chronic sinus inflammatory change.   [MM]  1934 MRI brain: IMPRESSION: 1. No acute intracranial abnormality.   [MM]  1938 Patient reevaluated, reassured by negative MRI brain.  In shared decision making, prefers discharge home with plan for close PMD follow-up, not interested in admission for possible TIA which I believe is very reasonable and I have a very low clinical suspicion for this at this time.  Did also offer neurology referral but patient declines and prefers to follow-up with his primary care provider which I again believe is very reasonable, overall suspect likely medication side effect from having taken 2 Xanax  this this morning.  No clear evidence of acute pathology at this time and I do believe patient stable for outpatient follow-up.  ED return precautions in place.  Patient and family  agree with plan. [MM]    Clinical Course User Index [MM] Clarine Ozell LABOR, MD     FINAL CLINICAL IMPRESSION(S) / ED DIAGNOSES   Final diagnoses:  Slow rate of speech  Medication side effect     Rx / DC Orders   ED Discharge Orders     None        Note:  This document was prepared using Dragon voice recognition software and may include unintentional dictation errors.   Clarine Ozell LABOR, MD 07/20/24 [phone]

## 2024-07-20 NOTE — Telephone Encounter (Signed)
Pt is currently at the ED

## 2024-07-20 NOTE — ED Triage Notes (Signed)
 First nurse note: Pt to ED via POV from [facility]. Pt reports went to a meeting at 0830 and staff noticed slurring speech, walking into walks and off balance. No hx of CVA. Hx DM. Pt reports glucometer has not been working. Pt did take a xanax  prior to symptom onset.

## 2024-07-20 NOTE — ED Triage Notes (Signed)
 Went to work this morning.  Felt some anxiety.  Patient took 2 Xanax  and went to a meeting at work.  Pt's boss told patient that she noticed that patient was walking funny, speech was slow and a little slurred.  When patient left meeting patient had a piece of hard candy and an orange. States he was walking and felt unbalanced, lean while walking, bumping into things.  Stated he felt like he was having trouble getting words out. Got home fell asleep in recliner.  Presents AAOx3. Skin warm and dry. MAE equally and strong. NAD

## 2024-07-20 NOTE — ED Notes (Signed)
 Pt requested CBG check due to Dexcom alarming on his phone to low blood sugar. CBG checked, 71. MD made aware. Per Dr. Clarine, pt may eat and drink. Pt provided with turkey sandwich box lunch and diet ginger ale.

## 2024-07-20 NOTE — Discharge Instructions (Addendum)
 Your evaluation in the emergency department was overall reassuring.  I do suspect your symptoms were likely due to Xanax  today, and I recommend minimizing your Xanax  use.  Please do follow-up with your primary care provider for reevaluation, and return to the emergency department with any new or worsening symptoms.

## 2024-07-20 NOTE — Telephone Encounter (Signed)
 FYI Only or Action Required?: FYI only for provider: ED advised.  Patient was last seen in primary care on 05/21/2024 by Marylynn Verneita CROME, MD.  Called Nurse Triage reporting Speech Problem.  Symptoms began today.  Interventions attempted: Nothing.  Symptoms are: unchanged.  Triage Disposition: Go to ED Now (Notify PCP)  Patient/caregiver understands and will follow disposition?: Yes   Copied from CRM [id number]. Topic: Clinical - Red Word Triage >> Jul 20, 2024 12:35 PM Berneda FALCON wrote: Red Word that prompted transfer to Nurse Triage: Wife, Elyn wanted to speak to nurse-he doesn't want to take him to the ER. Called her from work stating he was not acting like himself-slurred speech, walking around like a zombie, was holding on to something and would just drop it, states he took 2 Xanex this morning, blood sugar went high 350 at 0600    Reason for Disposition  [1] Loss of speech or garbled speech AND [2] sudden onset AND [3] brief (now gone)  Answer Assessment - Initial Assessment Questions Pt's wife called in to report change in pt's neuro status: change in speech, zoning out, dropping objects and unsteady on his feet. She reports pt did take 2 xanax  this morning d/t anxiety about work meeting at [employer]. She states that a coworker called her to report symptoms after the meeting and that coworker brought pt home d/t symptoms. Wife denies facial drooping. Discussed red flag signs of stroke as wife stated I don't know much about strokes but his face looks the same. Discussed based on imbalance, changes in speech and mentation, pt should go to ED for evaluation. She stated pt did not want to go to ED and is current asleep. While on phone, she woke pt up to check on him. Pt did arouse to the sound of wife's voice. Wife reports that pt does have DM, BGL at 0600 350 and 188 around noon. Pt did report to wife that something doesn't feel right and that he is aware of symptoms at this time. This RN  still recommended ED disposition. Wife voiced understanding.     1. SYMPTOM: What is the main symptom you are concerned about? (e.g., weakness, numbness)     Pt's wife reports new onset of slurred/confused speech starting while pt at work at [employer]. States that she received a call from one of his coworkers at [employer] that pt conducted a meeting where he was not making sense, was dropping papers and walking around office like a zombie. Coworker brought pt home and upon arrival, wife reported similar symptoms. States that pt did taking 2 xanax  this morning d/t anxiety about meeting at work but that he has done this in the past without any effects. Reports pt is currently asleep; she did wake pt up while on phone. RN able to hear pt arouse to sound of wife's voice, stated he felt okay at this time. Wife left him to sleep.   2. ONSET: When did this start? (e.g., minutes, hours, days; while sleeping)     Today, approx around 9am   3. LAST NORMAL: When was the last time you (the patient) were normal (no symptoms)?     Before he left for work today. Wife states she saw him this morning and knows meeting was at 9am   4. PATTERN Does this come and go, or has it been constant since it started?  Is it present now?     Constant; symptoms still present  5. CARDIAC SYMPTOMS: Have you  had any of the following symptoms: chest pain, difficulty breathing, palpitations?     None   6. NEUROLOGIC SYMPTOMS: Have you had any of the following symptoms: headache, dizziness, vision loss, double vision, changes in speech, unsteady on your feet?     Unsteady on feet, change in speech   7. OTHER SYMPTOMS: Do you have any other symptoms?     Zoning out; unable to hold onto objects, keeps dropping things. Wife reports pt seems unsteady and bumping into things  Protocols used: Neurologic Deficit-A-AH

## 2024-08-20 ENCOUNTER — Ambulatory Visit: Admitting: Internal Medicine

## 2024-08-25 ENCOUNTER — Other Ambulatory Visit: Payer: Self-pay | Admitting: Internal Medicine

## 2024-08-25 DIAGNOSIS — E1165 Type 2 diabetes mellitus with hyperglycemia: Secondary | ICD-10-CM

## 2024-09-07 ENCOUNTER — Other Ambulatory Visit: Payer: Self-pay | Admitting: Internal Medicine

## 2024-09-24 ENCOUNTER — Ambulatory Visit: Admitting: Internal Medicine
